# Patient Record
Sex: Female | Born: 1990 | Race: Black or African American | Hispanic: No | Marital: Single | Smoking: Former smoker
Health system: Southern US, Community
[De-identification: ages and names within clinical notes are randomized; demographics above are authoritative.]

## PROBLEM LIST (undated history)

## (undated) ENCOUNTER — Inpatient Hospital Stay (HOSPITAL_COMMUNITY): Payer: Self-pay

## (undated) DIAGNOSIS — Z789 Other specified health status: Secondary | ICD-10-CM

## (undated) DIAGNOSIS — R51 Headache: Secondary | ICD-10-CM

## (undated) DIAGNOSIS — C801 Malignant (primary) neoplasm, unspecified: Secondary | ICD-10-CM

## (undated) HISTORY — PX: MOUTH SURGERY: SHX715

## (undated) HISTORY — PX: APPENDECTOMY: SHX54

---
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
## 2009-08-26 ENCOUNTER — Other Ambulatory Visit: Admission: RE | Admit: 2009-08-26 | Discharge: 2009-08-26 | Payer: Self-pay | Admitting: Family Medicine

## 2009-10-24 ENCOUNTER — Emergency Department (HOSPITAL_COMMUNITY): Admission: EM | Admit: 2009-10-24 | Discharge: 2009-10-24 | Payer: Self-pay | Admitting: Emergency Medicine

## 2010-01-10 ENCOUNTER — Encounter: Admission: RE | Admit: 2010-01-10 | Discharge: 2010-01-10 | Payer: Self-pay | Admitting: Family Medicine

## 2010-08-27 LAB — RPR: RPR: NONREACTIVE

## 2010-08-27 LAB — ABO/RH

## 2010-08-27 LAB — HIV ANTIBODY (ROUTINE TESTING W REFLEX): HIV: NONREACTIVE

## 2010-08-27 LAB — GC/CHLAMYDIA PROBE AMP, GENITAL: Chlamydia: NEGATIVE

## 2010-10-14 NOTE — Consult Note (Signed)
[name], [name] NO.:  [id number]   MEDICAL RECORD NO.:  [id number]          PATIENT TYPE:  INP   LOCATION:  1540                         FACILITY:  [facility]   PHYSICIAN:  Ardeth Sportsman, MD     DATE OF BIRTH:  Nov 25, 1990   DATE OF CONSULTATION:  05/30/2007  DATE OF DISCHARGE:                                 CONSULTATION   REFERRING PHYSICIAN:  Carleene Cooper, M.D., [facility] Emergency  Department.   REASON FOR CONSULTATION:  Acute appendicitis.   HISTORY OF PRESENT ILLNESS:  [name] is a 20 year old  female, otherwise very healthy, who about 2 days ago started feeling  some abdominal pain.  It was rather vague, but is now more focused in  the right lower quadrant.  She has nauseated.  She has come close to  vomiting, but has not vomited.  No sick contacts or travel history.  She  is not had pain like this before.  Normally, she can eat anything she  wants without any difficulty, no history of inflammatory bowel disease  or irritable bowel syndrome.  She has no history of dysuria, polyuria or  recent UTI.  Her last menstrual period was about a week ago and she is  on Depo-Provera and that has been relatively stable.   The patient had concerns and her family brought her into the emergency  room for evaluation.   PAST MEDICAL HISTORY:  Negative.   PAST SURGICAL HISTORY:  Negative.   SOCIAL HISTORY:  No tobacco, alcohol or drug use.  She live with her  family who are present.   FAMILY HISTORY:  Negative for GI, hepatobiliary or colonic disorders.  No history of inflammatory bowel disease or irritable bowel syndrome.   REVIEW OF SYSTEMS:  As per HPI, otherwise constitutional,  ophthalmologic, ENT, cardiac, respiratory, GU, musculoskeletal,  neurologic, endocrine, hematologic, lymphatic and allergic are otherwise  negative.  GI:  As per HPI, no hematochezia or melena.   PHYSICAL EXAMINATION:  VITAL SIGNS:  Temperature is 97.8, respirations  18, blood pressure 196/48, pulse 80.  GENERAL:  She is a well-developed, well-nourished female lying very  still, uncomfortable, but no frankly toxic.  PSYCHIATRIC:  She is quiet, but interactive.  No evidence of dementia,  delirium, psychosis or paranoia.  NEUROLOGIC:  Cranial nerves 2-12 grossly intact.  Strength is 5/5 and  symmetrical.  No resting or tension tremors.  HEENT:  Pupils equal round and reactive to light, extraocular movements  intact.  Sclerae nonicteric, noninjected.  Normocephalic.  Mucous  membranes moist.  Nasopharynx and oropharynx clear.  HEART:  Regular rate and rhythm with no murmurs, rubs or gallops.  CHEST:  Clear to auscultation bilaterally with no wheezes, rales or  rhonchi.  NECK:  Supple without any masses.  Trachea in midline.  No carotid  bruits.  ABDOMEN:  Flat, but firm.  She has obvious tenderness in right lower  quadrant, greater in her suprapubic region with some guarding and early  peritoneal signs.  Left side of her abdomen and right upper quadrant  seem to not be very tender.  PELVIC:  Normal external female genitalia.  RECTAL:  Refused.  EXTREMITIES:  No clubbing, cyanosis or edema.  MUSCULOSKELETAL:  Full range of motion in shoulders, wrists, knee,  elbows and ankles.  LYMPHATICS:  No head, neck or groin lymphadenopathy.  SKIN:  No obvious petechiae, purpura or other sources of lesions.   LABORATORY DATA AND X-RAY FINDINGS:  Lipase 17.  Comprehensive metabolic  profile is negative.  Her white count is 6.5 with lymphocytes of 51% and  slight shift, hemoglobin 11.7 which is decreased.  Her wet-prep shows  many clue cells, but few white cells.  Urinalysis shows some rare  bacteria and trace leukocyte esterase.  Urine pregnancy is negative.   CT scan of the abdomen and pelvis which I reviewed with Dr. Kennith Center, with our radiology, which shows an inflamed appendix that  appears to be working in sigmoid fashion, initially cephalad going  down  into the pelvis, but no free fluid, no definite free air, although  suspicious for some thickening and possible perforation.  No evidence of  any bowel obstruction.   ASSESSMENT/RECOMMENDATIONS:  A 20 year old female with classic history  and physical and radiographic findings concerning for acute  appendicitis.  Anatomy and physiology of the digestive tract was clean.  Pathology of appendicitis, perforation, abscess and death were  discussed.  Options were discussed and recommendations made for a  diagnostic laparoscopy with appendectomy.   Risks of stroke, DVT, heart attack, pulmonary embolism and death were  discussed.  Risks such as bleeding, need for transfusion, wound  infection, abscess or prolonged pain were discussed.  Other diagnoses  and other risks were discussed.  Questions were discussed in front of  her family and she and her family agree to proceed.  I will work to do  this tonight.      Ardeth Sportsman, MD  Electronically Signed     SCG/MEDQ  D:  05/30/2007  T:  05/31/2007  Job:  [id number]

## 2010-10-14 NOTE — Op Note (Signed)
NAMECOLUMBIA, PANDEY NO.:  [id number]   MEDICAL RECORD NO.:  [id number]          PATIENT TYPE:  INP   LOCATION:  0098                         FACILITY:  [facility]   PHYSICIAN:  Ardeth Sportsman, MD     DATE OF BIRTH:  [birth year]   DATE OF PROCEDURE:  DATE OF DISCHARGE:                               OPERATIVE REPORT   PRIMARY CARE PHYSICIAN:  Not available.   ED M.D.:  Carleene Cooper, M.D.   SURGEON:  Ardeth Sportsman, M.D.   PREOPERATIVE DIAGNOSIS:  Acute appendicitis.   POSTOPERATIVE DIAGNOSES:  Acute appendicitis, nonperforated, not  supparative.   PROCEDURE PERFORMED:  Diagnostic laparoscopy with appendectomy.   ANESTHESIAS:  1. General anesthesia.  2. Local anesthetic in a field block around all port sites.   SPECIMENS:  Appendix.   DRAINS:  None.   ESTIMATED BLOOD LOSS:  Less than 5 mL.   COMPLICATIONS:  None apparent.   INDICATIONS:  Ms. Gigi Gin is a 20 year old female with an  episode of abdominal pain and worsening nausea that is focused down in  the right lower quadrant with physical exam and CT scan findings  strongly suggestive of acute appendicitis.  Anatomy &  physiology of the  digestive tract was explained and pathophysiology of acute appendicitis  was explained, with its risks of perforation, abscess formation and  death.  Options were discussed, recommendations made for diagnostic  laparoscopy with probable appendectomy.  Possible conversion to open.  Risks such as stroke, heart attack, deep venous thrombosis, pulmonary  embolism and death were discussed.  Risks such as bleeding, need for  transfusion, wound infection, abscess, injury to organs, prolonged pain,  incisional hernia and other diagnoses risks were discussed.  Questions  answered and she agreed to proceed.   OPERATIVE FINDINGS:  She had early acute appendicitis, with very long  appendix, but no significant evidence of separation or perforation or  abscess or any  other intra-abdominal pathology.  She had normal uterus  and ovaries, small bowel and colon.  Gallbladder and liver appeared to  be normal.  There were no intra-abdominal adhesions.  There is no  evidence of Fitz-Hugh-Curtis syndrome or pelvic inflammatory disease or  any other abnormalities to explain her pain.   DESCRIPTION OF PROCEDURE:  Informed consent was confirmed.  The patient  was already on IV antibiotics.  She voided just prior to going to the  operating room.  She had sequential compression devices active during  the entire case.  She underwent general anesthesia without any  difficulty.  She was just in supine, both arms tucked.  Her abdomen was  prepped and draped in sterile fashion.   Entry was gained in the abdomen, the patient was in deep reverse  Trendelenburg on the right.  We placed a 5 mm port in the right upper  quadrant using optical entry technique.  Camera inspection revealed no  intra-abdominal injury.  Capnoperitoneum to 15 mmHg provided good  abdominal insufflation.  Under direct visualization, 5 mm ports were  placed through the umbilicus and a 12 mm port was placed in the midline  suprapubic region.   Diagnostic laparoscopy was performed, which  noted findings above, which  were essentially normal, except for a thickened appendix going down  towards the pelvis.  The appendix and cecum, ascending colon were  mobilized in the lateral medial fashion by taking lateral sidewall  attachments to help bring the appendix more anteriorly.  Appendiceal  mesentery was ligated using ultrasonic dissection down to the normal  cecal base.  The appendix was removed using a laparoscopic stapler and  removed within the 12 mm port without any difficulty.   Over a liter of copious irrigation of saline provided good clear return.  Hemostasis was excellent.  The staple line was inspected and was intact  and healthy, with no evidence of any ischemia or perforation.  Inspection  was made down in the pelvis and other organs, with no  evidence of any intra-abdominal injury.  The capnoperitoneum was  aspirated, with the patient in steep reversed Trendelenburg.  The ports  were removed.  Suprapubic subfascial defect was closed using zero Vicryl  stitch.  Skin was closed using 4-0 Monocryl stitch.  The patient was  extubated and taken to the recovery room in stable condition.  I  explained the operative findings to the patient's family, especially her  mother.  Postoperative instructions were discussed.  They expressed  understanding and appreciation.      Ardeth Sportsman, MD  Electronically Signed     SCG/MEDQ  D:  05/31/2007  T:  05/31/2007  Job:  [id number]

## 2011-01-01 ENCOUNTER — Encounter (HOSPITAL_COMMUNITY): Payer: Self-pay | Admitting: *Deleted

## 2011-01-01 ENCOUNTER — Inpatient Hospital Stay (HOSPITAL_COMMUNITY)
Admission: AD | Admit: 2011-01-01 | Discharge: 2011-01-01 | Disposition: A | Discharge status: Home / Self Care | Payer: Self-pay | Source: Ambulatory Visit | Attending: Obstetrics and Gynecology | Admitting: Obstetrics and Gynecology

## 2011-01-01 DIAGNOSIS — N390 Urinary tract infection, site not specified: Secondary | ICD-10-CM

## 2011-01-01 DIAGNOSIS — O9989 Other specified diseases and conditions complicating pregnancy, childbirth and the puerperium: Secondary | ICD-10-CM | POA: Insufficient documentation

## 2011-01-01 DIAGNOSIS — R350 Frequency of micturition: Secondary | ICD-10-CM | POA: Insufficient documentation

## 2011-01-01 DIAGNOSIS — O2390 Unspecified genitourinary tract infection in pregnancy, unspecified trimester: Secondary | ICD-10-CM

## 2011-01-01 DIAGNOSIS — R109 Unspecified abdominal pain: Secondary | ICD-10-CM | POA: Insufficient documentation

## 2011-01-01 DIAGNOSIS — O2340 Unspecified infection of urinary tract in pregnancy, unspecified trimester: Secondary | ICD-10-CM

## 2011-01-01 HISTORY — DX: Other specified health status: Z78.9

## 2011-01-01 LAB — URINE MICROSCOPIC-ADD ON

## 2011-01-01 LAB — URINALYSIS, ROUTINE W REFLEX MICROSCOPIC
Ketones, ur: 15 mg/dL — AB
Specific Gravity, Urine: 1.015 (ref 1.005–1.030)

## 2011-01-01 MED ORDER — CEPHALEXIN 500 MG PO CAPS: 500.0000 mg | ORAL_CAPSULE | Freq: Four times a day (QID) | ORAL | Status: AC

## 2011-01-01 NOTE — ED Provider Notes (Signed)
History   Pt presents today c/o abd pain that comes and goes. She states the pain began about an hour and a half ago. She states she also noticed one "spot" of blood when she wiped several hours ago before she went to sleep. She denies vag dc, irritation, dysuria, or any other problems at this time. She states she has not seen any bleeding since that one time. She denies recent intercourse  Chief Complaint  Patient presents with  . Abdominal Pain   HPI  OB History    Grav Para Term Preterm Abortions TAB SAB Ect Mult Living   1               Past Medical History  Diagnosis Date  . No pertinent past medical history     Past Surgical History  Procedure Date  . Appendectomy     No family history on file.  History  Substance Use Topics  . Smoking status: Never Smoker   . Smokeless tobacco: Not on file  . Alcohol Use: No    Allergies:  Allergies  Allergen Reactions  . Morphine And Related Swelling    Prescriptions prior to admission  Medication Sig Dispense Refill  . prenatal vitamin w/FE, FA (PRENATAL 1 + 1) 27-1 MG TABS Take 1 tablet by mouth daily.          Review of Systems  Constitutional: Negative for fever.  Cardiovascular: Negative for chest pain.  Gastrointestinal: Positive for abdominal pain. Negative for nausea, vomiting, diarrhea and constipation.  Genitourinary: Positive for frequency. Negative for dysuria, urgency, hematuria and flank pain.  Neurological: Negative for dizziness and headaches.  Psychiatric/Behavioral: Negative for depression and suicidal ideas.   Physical Exam   Blood pressure 101/62, pulse 84, temperature 97.7 F (36.5 C), temperature source Oral, resp. rate 18, height 5\' 4"  (1.626 m), weight 133 lb (60.328 kg).  Physical Exam  Constitutional: She is oriented to person, place, and time. She appears well-developed and well-nourished. No distress.  HENT:  Head: Normocephalic and atraumatic.  Eyes: EOM are normal. Pupils are equal,  round, and reactive to light.  GI: Soft. She exhibits no distension. There is no tenderness. There is no rebound and no guarding.  Genitourinary: Vagina normal. No tenderness or bleeding around the vagina. No foreign body around the vagina. No vaginal discharge found.       Cervix Lg/closed. No bleeding noted.   Neurological: She is alert and oriented to person, place, and time.  Skin: Skin is warm and dry. She is not diaphoretic.  Psychiatric: She has a normal mood and affect. Her behavior is normal. Judgment and thought content normal.    MAU Course  Procedures  Discussed pt with Amy Scott at length. Will po hydrate. If ctx remain irregular and no cervical change, then will dc to home and tx for UTI.  Recheck of pt cervix at 4:54 shows no change. Pt no longer having pain. Ctx have now spaced out. NST is reactive.  Assessment and Plan  UTI: discussed with pt at length. Will give Rx for keflex. She has a f/u appt on Thursday of next week. Discussed diet, activity, risks, and precautions. Discussed signs and sx of preterm labor. She understood and agreed.  Amy Scott. Amy Scott III, DrHSc, MPAS, PA-C  01/01/2011, 4:09 AM   Amy Hoover, PA 01/01/11 [phone]

## 2011-01-01 NOTE — Progress Notes (Signed)
Pt reports she went to restroom and had a "spot of blood" about 3 hours ago, pain in lower abd started about 1 hour ago, pain comes and goes, denies dysuria.

## 2011-01-02 LAB — URINE CULTURE

## 2011-02-05 ENCOUNTER — Inpatient Hospital Stay (HOSPITAL_COMMUNITY)
Admission: AD | Admit: 2011-02-05 | Discharge: 2011-02-06 | Disposition: A | Discharge status: Home / Self Care | Payer: Medicaid Other | Source: Ambulatory Visit | Attending: Obstetrics and Gynecology | Admitting: Obstetrics and Gynecology

## 2011-02-05 ENCOUNTER — Encounter (HOSPITAL_COMMUNITY): Payer: Self-pay | Admitting: *Deleted

## 2011-02-05 DIAGNOSIS — M549 Dorsalgia, unspecified: Secondary | ICD-10-CM | POA: Insufficient documentation

## 2011-02-05 DIAGNOSIS — R109 Unspecified abdominal pain: Secondary | ICD-10-CM | POA: Insufficient documentation

## 2011-02-05 DIAGNOSIS — O99891 Other specified diseases and conditions complicating pregnancy: Secondary | ICD-10-CM | POA: Insufficient documentation

## 2011-02-05 NOTE — Progress Notes (Signed)
Pt presents to mau for contractions that started around 5pm.  Have come and gone irregularly.  Will be every and then slow down for a while and then start back.

## 2011-02-05 NOTE — Progress Notes (Signed)
Pt reports sharp pain in back and lower stomach off/on since 1700 today. Denies bleeding or ROM. G1, [facility] 03/04/2011

## 2011-02-05 NOTE — Progress Notes (Signed)
Dr. Richardson Dopp notified of pt presenting for contractions.  Notified of pt stating ctx that come and go irregularly but average every 5 mins.  Notified of VE.  Orders received to get UA, recheck cervix after one hour and if no change and UA normal pt may dc home.

## 2011-02-06 LAB — URINE MICROSCOPIC-ADD ON

## 2011-02-06 LAB — URINALYSIS, ROUTINE W REFLEX MICROSCOPIC
Hgb urine dipstick: NEGATIVE
Ketones, ur: NEGATIVE mg/dL
Nitrite: NEGATIVE
Urobilinogen, UA: 0.2 mg/dL (ref 0.0–1.0)
pH: 6 (ref 5.0–8.0)

## 2011-02-06 NOTE — Progress Notes (Signed)
Reviewed UA with NP.  Will dc patient.

## 2011-03-06 ENCOUNTER — Encounter (HOSPITAL_COMMUNITY): Payer: Self-pay | Admitting: *Deleted

## 2011-03-06 ENCOUNTER — Telehealth (HOSPITAL_COMMUNITY): Payer: Self-pay | Admitting: *Deleted

## 2011-03-06 LAB — COMPREHENSIVE METABOLIC PANEL
ALT: 12
AST: 15
CO2: 22
Calcium: 8.7
Chloride: 110
Creatinine, Ser: 0.6
Glucose, Bld: 120 — ABNORMAL HIGH
Total Bilirubin: 1

## 2011-03-06 LAB — URINE MICROSCOPIC-ADD ON

## 2011-03-06 LAB — WET PREP, GENITAL: Trich, Wet Prep: NONE SEEN

## 2011-03-06 LAB — CBC
Hemoglobin: 11.7 — ABNORMAL LOW
MCHC: 34.5
MCV: 84.6
RBC: 4
WBC: 6.5

## 2011-03-06 LAB — URINALYSIS, ROUTINE W REFLEX MICROSCOPIC
Bilirubin Urine: NEGATIVE
Glucose, UA: NEGATIVE
Nitrite: NEGATIVE
Protein, ur: NEGATIVE
Urobilinogen, UA: 1
pH: 6.5

## 2011-03-06 LAB — DIFFERENTIAL
Basophils Absolute: 0
Eosinophils Absolute: 0
Eosinophils Relative: 1
Lymphs Abs: 3.3
Neutrophils Relative %: 40 — ABNORMAL LOW

## 2011-03-06 LAB — LIPASE, BLOOD: Lipase: 17

## 2011-03-06 NOTE — Telephone Encounter (Signed)
Preadmission screen  

## 2011-03-09 ENCOUNTER — Inpatient Hospital Stay (HOSPITAL_COMMUNITY)
Admission: RE | Admit: 2011-03-09 | Discharge: 2011-03-12 | DRG: 775 | Disposition: A | Discharge status: Home / Self Care | Payer: Medicaid Other | Source: Ambulatory Visit | Attending: Obstetrics and Gynecology | Admitting: Obstetrics and Gynecology

## 2011-03-09 ENCOUNTER — Other Ambulatory Visit: Payer: Self-pay | Admitting: Obstetrics and Gynecology

## 2011-03-09 ENCOUNTER — Encounter (HOSPITAL_COMMUNITY): Payer: Self-pay

## 2011-03-09 LAB — CBC
HCT: 33.9 % — ABNORMAL LOW (ref 36.0–46.0)
Hemoglobin: 11.3 g/dL — ABNORMAL LOW (ref 12.0–15.0)
MCH: 28.7 pg (ref 26.0–34.0)
MCHC: 33.3 g/dL (ref 30.0–36.0)
MCV: 86 fL (ref 78.0–100.0)
RDW: 16.6 % — ABNORMAL HIGH (ref 11.5–15.5)

## 2011-03-09 MED ORDER — FLEET ENEMA 7-19 GM/118ML RE ENEM: 1.0000 | ENEMA | RECTAL | Status: DC | PRN

## 2011-03-09 MED ORDER — ONDANSETRON HCL 4 MG/2ML IJ SOLN: 4.0000 mg | Freq: Four times a day (QID) | INTRAMUSCULAR | Status: DC | PRN

## 2011-03-09 MED ORDER — OXYTOCIN 20 UNITS IN LACTATED RINGERS INFUSION - SIMPLE
125.0000 mL/h | Freq: Once | INTRAVENOUS | Status: AC
Start: 2011-03-09 — End: 2011-03-10
  Administered 2011-03-10: 125 mL/h via INTRAVENOUS

## 2011-03-09 MED ORDER — DINOPROSTONE 10 MG VA INST
10.0000 mg | VAGINAL_INSERT | Freq: Once | VAGINAL | Status: AC
  Administered 2011-03-09: 10 mg via VAGINAL
  Filled 2011-03-09: qty 1

## 2011-03-09 MED ORDER — CITRIC ACID-SODIUM CITRATE 334-500 MG/5ML PO SOLN: 30.0000 mL | ORAL | Status: DC | PRN

## 2011-03-09 MED ORDER — ZOLPIDEM TARTRATE 10 MG PO TABS
10.0000 mg | ORAL_TABLET | Freq: Every evening | ORAL | Status: DC | PRN
  Administered 2011-03-09: 10 mg via ORAL
  Filled 2011-03-09: qty 1

## 2011-03-09 MED ORDER — ACETAMINOPHEN 325 MG PO TABS: 650.0000 mg | ORAL_TABLET | ORAL | Status: DC | PRN

## 2011-03-09 MED ORDER — IBUPROFEN 600 MG PO TABS
600.0000 mg | ORAL_TABLET | Freq: Four times a day (QID) | ORAL | Status: DC | PRN
  Administered 2011-03-10: 600 mg via ORAL
  Filled 2011-03-09: qty 1

## 2011-03-09 MED ORDER — TERBUTALINE SULFATE 1 MG/ML IJ SOLN: 0.2500 mg | Freq: Once | INTRAMUSCULAR | Status: AC | PRN

## 2011-03-09 MED ORDER — LIDOCAINE HCL (PF) 1 % IJ SOLN
30.0000 mL | INTRAMUSCULAR | Status: DC | PRN
  Filled 2011-03-09 (×2): qty 30

## 2011-03-09 MED ORDER — LACTATED RINGERS IV SOLN: 500.0000 mL | INTRAVENOUS | Status: DC | PRN

## 2011-03-09 MED ORDER — LACTATED RINGERS IV SOLN: INTRAVENOUS | Status: DC

## 2011-03-09 MED ORDER — OXYTOCIN BOLUS FROM INFUSION
500.0000 mL | Freq: Once | INTRAVENOUS | Status: DC
Start: 2011-03-09 — End: 2011-03-10
  Filled 2011-03-09: qty 1000
  Filled 2011-03-09: qty 500

## 2011-03-09 NOTE — Progress Notes (Signed)
Dr. Richardson Dopp called to check on pt. Informed Dr. Richardson Dopp patient Cervadil about to be placed. MD aware of reactive FHR.

## 2011-03-10 ENCOUNTER — Encounter (HOSPITAL_COMMUNITY): Payer: Self-pay | Admitting: Anesthesiology

## 2011-03-10 ENCOUNTER — Inpatient Hospital Stay (HOSPITAL_COMMUNITY): Payer: Medicaid Other | Admitting: Anesthesiology

## 2011-03-10 LAB — COMPREHENSIVE METABOLIC PANEL
ALT: 6 U/L (ref 0–35)
AST: 17 U/L (ref 0–37)
Alkaline Phosphatase: 176 U/L — ABNORMAL HIGH (ref 39–117)
Calcium: 9.1 mg/dL (ref 8.4–10.5)
Glucose, Bld: 99 mg/dL (ref 70–99)
Potassium: 3.3 mEq/L — ABNORMAL LOW (ref 3.5–5.1)
Sodium: 137 mEq/L (ref 135–145)
Total Protein: 5.8 g/dL — ABNORMAL LOW (ref 6.0–8.3)

## 2011-03-10 MED ORDER — LANOLIN HYDROUS EX OINT: TOPICAL_OINTMENT | CUTANEOUS | Status: DC | PRN

## 2011-03-10 MED ORDER — MEASLES, MUMPS & RUBELLA VAC ~~LOC~~ INJ
0.5000 mL | INJECTION | Freq: Once | SUBCUTANEOUS | Status: DC
  Filled 2011-03-10: qty 0.5

## 2011-03-10 MED ORDER — PHENYLEPHRINE 40 MCG/ML (10ML) SYRINGE FOR IV PUSH (FOR BLOOD PRESSURE SUPPORT)
80.0000 ug | PREFILLED_SYRINGE | INTRAVENOUS | Status: DC | PRN
  Filled 2011-03-10 (×2): qty 5

## 2011-03-10 MED ORDER — PRENATAL PLUS 27-1 MG PO TABS
1.0000 | ORAL_TABLET | Freq: Every day | ORAL | Status: DC
  Administered 2011-03-10 – 2011-03-12 (×3): 1 via ORAL
  Filled 2011-03-10 (×3): qty 1

## 2011-03-10 MED ORDER — BENZOCAINE-MENTHOL 20-0.5 % EX AERO: 1.0000 "application " | INHALATION_SPRAY | CUTANEOUS | Status: DC | PRN

## 2011-03-10 MED ORDER — DIBUCAINE 1 % RE OINT: 1.0000 "application " | TOPICAL_OINTMENT | RECTAL | Status: DC | PRN

## 2011-03-10 MED ORDER — LACTATED RINGERS IV SOLN: 500.0000 mL | Freq: Once | INTRAVENOUS | Status: DC

## 2011-03-10 MED ORDER — FENTANYL 2.5 MCG/ML BUPIVACAINE 1/10 % EPIDURAL INFUSION (WH - ANES)
14.0000 mL/h | INTRAMUSCULAR | Status: DC
  Filled 2011-03-10: qty 60

## 2011-03-10 MED ORDER — ONDANSETRON HCL 4 MG PO TABS: 4.0000 mg | ORAL_TABLET | ORAL | Status: DC | PRN

## 2011-03-10 MED ORDER — EPHEDRINE 5 MG/ML INJ
10.0000 mg | INTRAVENOUS | Status: DC | PRN
  Filled 2011-03-10: qty 4

## 2011-03-10 MED ORDER — POTASSIUM CHLORIDE CRYS ER 20 MEQ PO TBCR
20.0000 meq | EXTENDED_RELEASE_TABLET | Freq: Once | ORAL | Status: AC
  Administered 2011-03-10: 20 meq via ORAL
  Filled 2011-03-10: qty 1

## 2011-03-10 MED ORDER — FENTANYL 2.5 MCG/ML BUPIVACAINE 1/10 % EPIDURAL INFUSION (WH - ANES)
INTRAMUSCULAR | Status: DC | PRN
  Administered 2011-03-10: 14 mL/h via EPIDURAL

## 2011-03-10 MED ORDER — PHENYLEPHRINE 40 MCG/ML (10ML) SYRINGE FOR IV PUSH (FOR BLOOD PRESSURE SUPPORT)
80.0000 ug | PREFILLED_SYRINGE | INTRAVENOUS | Status: DC | PRN
  Filled 2011-03-10: qty 5

## 2011-03-10 MED ORDER — WITCH HAZEL-GLYCERIN EX PADS: 1.0000 "application " | MEDICATED_PAD | CUTANEOUS | Status: DC | PRN

## 2011-03-10 MED ORDER — ONDANSETRON HCL 4 MG/2ML IJ SOLN: 4.0000 mg | INTRAMUSCULAR | Status: DC | PRN

## 2011-03-10 MED ORDER — DIPHENHYDRAMINE HCL 25 MG PO CAPS: 25.0000 mg | ORAL_CAPSULE | Freq: Four times a day (QID) | ORAL | Status: DC | PRN

## 2011-03-10 MED ORDER — TETANUS-DIPHTH-ACELL PERTUSSIS 5-2.5-18.5 LF-MCG/0.5 IM SUSP
0.5000 mL | Freq: Once | INTRAMUSCULAR | Status: AC
  Administered 2011-03-11: 0.5 mL via INTRAMUSCULAR
  Filled 2011-03-10: qty 0.5

## 2011-03-10 MED ORDER — IBUPROFEN 600 MG PO TABS
600.0000 mg | ORAL_TABLET | Freq: Four times a day (QID) | ORAL | Status: DC
  Administered 2011-03-10 – 2011-03-12 (×8): 600 mg via ORAL
  Filled 2011-03-10 (×8): qty 1

## 2011-03-10 MED ORDER — BENZOCAINE-MENTHOL 20-0.5 % EX AERO
INHALATION_SPRAY | CUTANEOUS | Status: AC
  Filled 2011-03-10: qty 56

## 2011-03-10 MED ORDER — EPHEDRINE 5 MG/ML INJ
10.0000 mg | INTRAVENOUS | Status: DC | PRN
  Filled 2011-03-10 (×2): qty 4

## 2011-03-10 MED ORDER — SIMETHICONE 80 MG PO CHEW: 80.0000 mg | CHEWABLE_TABLET | ORAL | Status: DC | PRN

## 2011-03-10 MED ORDER — ZOLPIDEM TARTRATE 5 MG PO TABS: 5.0000 mg | ORAL_TABLET | Freq: Every evening | ORAL | Status: DC | PRN

## 2011-03-10 MED ORDER — LIDOCAINE HCL 1.5 % IJ SOLN
INTRAMUSCULAR | Status: DC | PRN
  Administered 2011-03-10 (×2): 5 mL via EPIDURAL

## 2011-03-10 MED ORDER — MEDROXYPROGESTERONE ACETATE 150 MG/ML IM SUSP
150.0000 mg | INTRAMUSCULAR | Status: AC | PRN
  Administered 2011-03-12: 150 mg via INTRAMUSCULAR
  Filled 2011-03-10: qty 1

## 2011-03-10 MED ORDER — DIPHENHYDRAMINE HCL 50 MG/ML IJ SOLN: 12.5000 mg | INTRAMUSCULAR | Status: DC | PRN

## 2011-03-10 MED ORDER — FERROUS SULFATE 325 (65 FE) MG PO TABS
325.0000 mg | ORAL_TABLET | Freq: Three times a day (TID) | ORAL | Status: DC
  Administered 2011-03-10 – 2011-03-12 (×6): 325 mg via ORAL
  Filled 2011-03-10 (×8): qty 1

## 2011-03-10 MED ORDER — SENNOSIDES-DOCUSATE SODIUM 8.6-50 MG PO TABS: 2.0000 | ORAL_TABLET | Freq: Every day | ORAL | Status: DC

## 2011-03-10 NOTE — Anesthesia Preprocedure Evaluation (Signed)
Anesthesia Evaluation  Name, MR# and DOB Patient awake  General Assessment Comment  Reviewed: Allergy & Precautions, H&P , Patient's Chart, lab work & pertinent test results  Airway Mallampati: I TM Distance: >3 FB Neck ROM: full    Dental No notable dental hx.    Pulmonary    Pulmonary exam normal       Cardiovascular     Neuro/Psych Negative Neurological ROS  Negative Psych ROS   GI/Hepatic negative GI ROS Neg liver ROS    Endo/Other  Negative Endocrine ROS  Renal/GU negative Renal ROS  Genitourinary negative   Musculoskeletal negative musculoskeletal ROS (+)   Abdominal Normal abdominal exam  (+)   Peds  Hematology negative hematology ROS (+)   Anesthesia Other Findings   Reproductive/Obstetrics (+) Pregnancy                           Anesthesia Physical Anesthesia Plan  ASA: II  Anesthesia Plan: Epidural   Post-op Pain Management:    Induction:   Airway Management Planned:   Additional Equipment:   Intra-op Plan:   Post-operative Plan:   Informed Consent: I have reviewed the patients History and Physical, chart, labs and discussed the procedure including the risks, benefits and alternatives for the proposed anesthesia with the patient or authorized representative who has indicated his/her understanding and acceptance.     Plan Discussed with:   Anesthesia Plan Comments:         Anesthesia Quick Evaluation  

## 2011-03-10 NOTE — Progress Notes (Signed)
Delivery Note At 4:32 AM a viable and healthy female was delivered via Vaginal, Spontaneous Delivery (Presentation:vtx  ).  APGAR: 9, 9; weight 6 lb,15oz.   Placenta status: Intact, Spontaneous.  Cord: 3 vessels with the following complications: None.  Cord pH: na  Anesthesia: Epidural  Episiotomy: None Lacerations: 2nd degree Suture Repair: 2.0 chromic Est. Blood Loss (mL): 500cc  Mom to postpartum.  Baby to nursery-stable.  Kurt Azimi E 03/10/2011, 4:59 AM

## 2011-03-10 NOTE — H&P (Signed)
Genisis Sonnier is a 20 y.o. female presenting for  incuction at 40 wks and 5 days   OB History    Grav Para Term Preterm Abortions TAB SAB Ect Mult Living   1              Past Medical History  Diagnosis Date  . No pertinent past medical history    Past Surgical History  Procedure Date  . Appendectomy   . Mouth surgery    Family History: family history is not on file. Social History:  reports that she has never smoked. She does not have any smokeless tobacco history on file. She reports that she does not drink alcohol or use illicit drugs. ROS Negative   Dilation: 10 Effacement (%): 100 Station: +1 Exam by:: Chief Technology Officer Blood pressure 125/71, pulse 88, temperature 98.4 F (36.9 C), temperature source Oral, resp. rate 20, height 5\' 4"  (1.626 m), weight 66.679 kg (147 lb). CV RRR Lung Clear  abd soft nontender Ext trace edema    Prenatal labs: ABO, Rh: B/Positive/-- (03/28 0000) Antibody: Negative (03/28 0000) Rubella:   RPR: NON REACTIVE (10/08 2130)  HBsAg: Negative (03/28 0000)  HIV: Non-reactive, Non-reactive (03/28 0000)  GBS: Negative (09/13 0000)   Assessment/Plan: Pt admitted for induction over night received cervidil #1 and delivered  This am  Routine postpartum care  Johanny Segers J. 03/10/2011, 9:00 AM

## 2011-03-10 NOTE — Anesthesia Procedure Notes (Signed)
Epidural Patient location during procedure: OB Start time: 03/10/2011 3:29 AM End time: 03/10/2011 3:34 AM Reason for block: procedure for pain  Staffing Anesthesiologist: Sandrea Hughs Performed by: anesthesiologist   Preanesthetic Checklist Completed: patient identified, site marked, surgical consent, pre-op evaluation, timeout performed, IV checked, risks and benefits discussed and monitors and equipment checked  Epidural Patient position: sitting Prep: site prepped and draped and DuraPrep Patient monitoring: continuous pulse ox and blood pressure Approach: midline Injection technique: LOR air  Needle:  Needle type: Tuohy  Needle gauge: 17 G Needle length: 9 cm Needle insertion depth: 5 cm cm Catheter type: closed end flexible Catheter size: 19 Gauge Catheter at skin depth: 10 cm Test dose: negative and 1.5% lidocaine  Assessment Sensory level: T6 Events: blood not aspirated, injection not painful, no injection resistance, negative IV test and no paresthesia

## 2011-03-10 NOTE — Anesthesia Postprocedure Evaluation (Signed)
  Anesthesia Post-op Note  Patient: Amy Scott  Procedure(s) Performed: * No procedures listed *  Patient Location: Mother/Baby  Anesthesia Type: Epidural  Level of Consciousness: awake, alert , oriented and patient cooperative  Airway and Oxygen Therapy: Patient Spontanous Breathing  Post-op Pain: none  Post-op Assessment: Post-op Vital signs reviewed and Patient's Cardiovascular Status Stable  Post-op Vital Signs: Reviewed and stable  Complications: No apparent anesthesia complications

## 2011-03-10 NOTE — Progress Notes (Signed)
Pt was ordered CMP and a Uric acid labs this AM. Notified MD (Dr. Richardson Dopp) of the results. K-Dur ordered for the potassium level of 3.3. Lenon Oms RN.

## 2011-03-10 NOTE — Progress Notes (Signed)
UR Chart review completed.  

## 2011-03-11 LAB — CBC
HCT: 30.2 % — ABNORMAL LOW (ref 36.0–46.0)
Hemoglobin: 10.1 g/dL — ABNORMAL LOW (ref 12.0–15.0)
MCH: 28.8 pg (ref 26.0–34.0)
MCHC: 33.4 g/dL (ref 30.0–36.0)
MCV: 86 fL (ref 78.0–100.0)
RBC: 3.51 MIL/uL — ABNORMAL LOW (ref 3.87–5.11)

## 2011-03-11 MED ORDER — OXYCODONE-ACETAMINOPHEN 5-325 MG PO TABS: 1.0000 | ORAL_TABLET | ORAL | Status: DC | PRN

## 2011-03-11 NOTE — Progress Notes (Signed)
Post Partum Day 1 s/p SVD Subjective: no complaints, up ad lib, voiding, tolerating PO and + flatus  Objective: Blood pressure 95/62, pulse 71, temperature 98.8 F (37.1 C), temperature source Oral, resp. rate 18, height 5\' 4"  (1.626 m), weight 66.679 kg (147 lb).  Physical Exam:  General: alert and cooperative Lochia: appropriate Uterine Fundus: firm Incision: NA DVT Evaluation: No evidence of DVT seen on physical exam.   Basename 03/11/11 0520 03/09/11 2130  HGB 10.1* 11.3*  HCT 30.2* 33.9*    Assessment/Plan: Plan for discharge tomorrow and Breastfeeding   LOS: 2 days   Amy Scott J. 03/11/2011, 3:33 PM

## 2011-03-11 NOTE — Anesthesia Postprocedure Evaluation (Signed)
Anesthesia Post Note  Patient: Amy Scott  Procedure(s) Performed: * No procedures listed *  Anesthesia type: Epidural  Patient location: Mother/Baby  Post pain: Pain level controlled  Post assessment: Post-op Vital signs reviewed  Last Vitals:  Filed Vitals:   03/10/11 2135  BP: 97/59  Pulse: 82  Temp: 98.7 F (37.1 C)  Resp: 18    Post vital signs: Reviewed  Level of consciousness: awake  Complications: No apparent anesthesia complications

## 2011-03-12 ENCOUNTER — Encounter (HOSPITAL_COMMUNITY): Payer: Self-pay

## 2011-03-12 MED ORDER — PRENATAL PLUS 27-1 MG PO TABS: 1.0000 | ORAL_TABLET | Freq: Every day | ORAL | Status: DC

## 2011-03-12 MED ORDER — IRON 325 (65 FE) MG PO TABS: ORAL_TABLET | ORAL | Status: DC

## 2011-03-12 MED ORDER — MEDROXYPROGESTERONE ACETATE 150 MG/ML IM SUSP: 150.0000 mg | Freq: Once | INTRAMUSCULAR | Status: DC

## 2011-03-12 MED ORDER — IBUPROFEN 600 MG PO TABS: 600.0000 mg | ORAL_TABLET | Freq: Four times a day (QID) | ORAL | Status: AC | PRN

## 2011-03-12 NOTE — Discharge Summary (Signed)
Obstetric Discharge Summary Reason for Admission: induction of labor Prenatal Procedures: none Intrapartum Procedures: spontaneous vaginal delivery Postpartum Procedures: none Complications-Operative and Postpartum: none Hemoglobin  Date Value Range Status  03/11/2011 10.1* 12.0-15.0 (g/dL) Final     HCT  Date Value Range Status  03/11/2011 30.2* 36.0-46.0 (%) Final    Discharge Diagnoses: Term Pregnancy-delivered  Discharge Information: Date: 03/12/2011 Activity: pelvic rest Diet: routine Medications: PNV, Ibuprofen and Iron Condition: stable Instructions: refer to practice specific booklet Discharge to: home Follow-up Information    Follow up with Taneesha Edgin J.. Make an appointment in 6 weeks.   Contact information:   [address] [place] [address] [phone]          Newborn Data: Live born female  Birth Weight: 6 lb 15.3 oz (3155 g) APGAR: 9, 9  Home with mother.  Emma Schupp J. 03/12/2011, 9:36 AM

## 2011-03-12 NOTE — Progress Notes (Signed)
Post Partum Day 2 Subjective: no complaints, up ad lib, voiding, tolerating PO and + flatus  Objective: Blood pressure 106/72, pulse 65, temperature 98.1 F (36.7 C), temperature source Oral, resp. rate 18, height 5\' 4"  (1.626 m), weight 66.679 kg (147 lb).  Physical Exam:  General: alert and cooperative Lochia: appropriate Uterine Fundus: firm Incision: NA DVT Evaluation: No evidence of DVT seen on physical exam.   Basename 03/11/11 0520 03/09/11 2130  HGB 10.1* 11.3*  HCT 30.2* 33.9*    Assessment/Plan: Discharge home and Breastfeeding   LOS: 3 days   Tanish Prien J. 03/12/2011, 9:32 AM

## 2012-01-03 ENCOUNTER — Encounter (HOSPITAL_COMMUNITY): Payer: Self-pay | Admitting: *Deleted

## 2012-01-03 ENCOUNTER — Emergency Department (HOSPITAL_COMMUNITY)
Admission: EM | Admit: 2012-01-03 | Discharge: 2012-01-03 | Disposition: A | Discharge status: Home / Self Care | Payer: Medicaid Other | Attending: Emergency Medicine | Admitting: Emergency Medicine

## 2012-01-03 DIAGNOSIS — H00019 Hordeolum externum unspecified eye, unspecified eyelid: Secondary | ICD-10-CM

## 2012-01-03 DIAGNOSIS — F17200 Nicotine dependence, unspecified, uncomplicated: Secondary | ICD-10-CM | POA: Insufficient documentation

## 2012-01-03 MED ORDER — TETRACAINE HCL 0.5 % OP SOLN
2.0000 [drp] | Freq: Once | OPHTHALMIC | Status: AC
  Administered 2012-01-03: 2 [drp] via OPHTHALMIC
  Filled 2012-01-03: qty 2

## 2012-01-03 NOTE — ED Notes (Signed)
Patient is alert and oriented x3.  She is complaining of left eye pain that started on Friday. She currently rates her pain at 9 of 10.  She present with swelling to the left eye and states That it aches with some stinging and blurred vision.

## 2012-01-03 NOTE — ED Provider Notes (Signed)
History     CSN: [id number]  Arrival date & time 01/03/12  1433   First MD Initiated Contact with Patient 01/03/12 1536      Chief Complaint  Patient presents with  . Eye Pain    left eye    (Consider location/radiation/quality/duration/timing/severity/associated sxs/prior treatment) HPI Comments: Patient reports that she has noticed swelling of her left upper eyelid since she woke up on Friday morning, two days ago.  She reports that the area is tender and stings.  No injury to the eye.  No foreign body.  No photosensitivity.  No increased pain with EOM.  No drainage of the eye.  No redness of the eye.  No fever or chills.  She thinks that her vision may be more blurry today.    Patient is a 21 y.o. female presenting with eye pain. The history is provided by the patient.  Eye Pain Pertinent negatives include no chills, fever, nausea or vomiting.    Past Medical History  Diagnosis Date  . No pertinent past medical history     Past Surgical History  Procedure Date  . Appendectomy   . Mouth surgery     History reviewed. No pertinent family history.  History  Substance Use Topics  . Smoking status: Current Everyday Smoker  . Smokeless tobacco: Not on file  . Alcohol Use: No    OB History    Grav Para Term Preterm Abortions TAB SAB Ect Mult Living   1 1 1       1       Review of Systems  Constitutional: Negative for fever and chills.  Eyes: Positive for pain. Negative for photophobia, discharge and redness.  Gastrointestinal: Negative for nausea and vomiting.  Skin: Negative for color change.    Allergies  Morphine and related  Home Medications   Current Outpatient Rx  Name Route Sig Dispense Refill  . PRENATAL PLUS 27-1 MG PO TABS Oral Take 1 tablet by mouth daily.      BP 122/62  Pulse 76  Temp 98.5 F (36.9 C) (Oral)  Resp 18  SpO2 100%  LMP 12/13/2011  Breastfeeding? No  Physical Exam  Nursing note and vitals reviewed. Constitutional: She  appears well-developed and well-nourished. No distress.  HENT:  Head: Normocephalic and atraumatic.  Eyes: Conjunctivae and EOM are normal. Pupils are equal, round, and reactive to light. No foreign bodies found. Right eye exhibits no discharge and no exudate. No foreign body present in the right eye. Left eye exhibits hordeolum. Left eye exhibits no discharge and no exudate. No foreign body present in the left eye. Right conjunctiva is not injected. Right conjunctiva has no hemorrhage. Left conjunctiva is not injected. Left conjunctiva has no hemorrhage.  Slit lamp exam:      The right eye shows no fluorescein uptake.       The left eye shows no fluorescein uptake.         No increased pain with EOM  Neck: Normal range of motion. Neck supple.  Cardiovascular: Normal rate, regular rhythm and normal heart sounds.   Pulmonary/Chest: Effort normal and breath sounds normal.  Neurological: She is alert.  Skin: Skin is warm and dry. She is not diaphoretic. No erythema.  Psychiatric: She has a normal mood and affect.    ED Course  Procedures (including critical care time)  Labs Reviewed - No data to display No results found.   No diagnosis found.    MDM  Patient with Hordeolum  of her left upper eyelid.  Eyelid inverted, no foreign bodies.  Normal visual acuity.  Flouresein stain shows no corneal abrasion.          Pascal Lux Dazey, PA-C 01/04/12 [phone]

## 2012-01-04 NOTE — ED Provider Notes (Signed)
Medical screening examination/treatment/procedure(s) were performed by non-physician practitioner and as supervising physician I was immediately available for consultation/collaboration.  Margeret Stachnik, MD 01/04/12 1455 

## 2012-10-06 ENCOUNTER — Inpatient Hospital Stay (HOSPITAL_COMMUNITY)
Admission: AD | Admit: 2012-10-06 | Discharge: 2012-10-06 | Disposition: A | Discharge status: Home / Self Care | Payer: Medicaid Other | Source: Ambulatory Visit | Attending: Obstetrics & Gynecology | Admitting: Obstetrics & Gynecology

## 2012-10-06 ENCOUNTER — Encounter (HOSPITAL_COMMUNITY): Payer: Self-pay | Admitting: *Deleted

## 2012-10-06 DIAGNOSIS — R109 Unspecified abdominal pain: Secondary | ICD-10-CM | POA: Insufficient documentation

## 2012-10-06 DIAGNOSIS — N949 Unspecified condition associated with female genital organs and menstrual cycle: Secondary | ICD-10-CM

## 2012-10-06 DIAGNOSIS — O2390 Unspecified genitourinary tract infection in pregnancy, unspecified trimester: Secondary | ICD-10-CM | POA: Insufficient documentation

## 2012-10-06 DIAGNOSIS — B373 Candidiasis of vulva and vagina: Secondary | ICD-10-CM

## 2012-10-06 DIAGNOSIS — B3731 Acute candidiasis of vulva and vagina: Secondary | ICD-10-CM | POA: Insufficient documentation

## 2012-10-06 DIAGNOSIS — O26859 Spotting complicating pregnancy, unspecified trimester: Secondary | ICD-10-CM | POA: Insufficient documentation

## 2012-10-06 DIAGNOSIS — O210 Mild hyperemesis gravidarum: Secondary | ICD-10-CM | POA: Insufficient documentation

## 2012-10-06 HISTORY — DX: Headache: R51

## 2012-10-06 LAB — URINALYSIS, ROUTINE W REFLEX MICROSCOPIC
Bilirubin Urine: NEGATIVE
Ketones, ur: 40 mg/dL — AB
Specific Gravity, Urine: 1.02 (ref 1.005–1.030)
pH: 7.5 (ref 5.0–8.0)

## 2012-10-06 LAB — URINE MICROSCOPIC-ADD ON

## 2012-10-06 LAB — WET PREP, GENITAL: Trich, Wet Prep: NONE SEEN

## 2012-10-06 MED ORDER — FLUCONAZOLE 150 MG PO TABS
150.0000 mg | ORAL_TABLET | Freq: Once | ORAL | Status: AC
  Administered 2012-10-06: 150 mg via ORAL
  Filled 2012-10-06: qty 1

## 2012-10-06 MED ORDER — CYCLOBENZAPRINE HCL 10 MG PO TABS: 10.0000 mg | ORAL_TABLET | Freq: Two times a day (BID) | ORAL | Status: DC | PRN

## 2012-10-06 NOTE — MAU Provider Note (Signed)
History     CSN: [id number]  Arrival date and time: 10/06/12 1515   First Provider Initiated Contact with Patient 10/06/12 1631      Chief Complaint  Patient presents with  . Threatened Miscarriage   HPI [name] is a 22 y.o. G2P1001 at [redacted]w[redacted]d who presents to MAU today with complaint of spotting and lower abdominal pain. The patient states that she has noticed some spotting occasionally with wiping the last 2 days. She has lower abdominal pain that comes and goes. It is worse with ambulation and change or positions. She rates it at 5/10 now. She denies vaginal discharge, fever or UTI symptoms. She has had nausea and vomiting occasionally x 1 week, but denies nausea now.   OB History   Grav Para Term Preterm Abortions TAB SAB Ect Mult Living   2 1 1       1       Past Medical History  Diagnosis Date  . No pertinent past medical history   . Headache     Past Surgical History  Procedure Laterality Date  . Appendectomy    . Mouth surgery      History reviewed. No pertinent family history.  History  Substance Use Topics  . Smoking status: Current Every Day Smoker    Types: Cigarettes  . Smokeless tobacco: Never Used  . Alcohol Use: No    Allergies:  Allergies  Allergen Reactions  . Morphine And Related Itching and Swelling    Prescriptions prior to admission  Medication Sig Dispense Refill  . naproxen sodium (ANAPROX) 220 MG tablet Take 220 mg by mouth 2 (two) times daily as needed (for headache).        Review of Systems  Constitutional: Negative for fever, chills and malaise/fatigue.  Gastrointestinal: Positive for nausea, vomiting and abdominal pain. Negative for diarrhea and constipation.  Genitourinary: Negative for dysuria, urgency and frequency.       Neg - vaginal discharge + vaginal bleeding  Neurological: Negative for dizziness and loss of consciousness.   Physical Exam   Blood pressure 107/58, pulse 91, temperature 98.5 F (36.9 C),  temperature source Oral, resp. rate 18, height 5' 2.5" (1.588 m), weight 113 lb (51.256 kg), last menstrual period 07/04/2012, not currently breastfeeding.  Physical Exam  Constitutional: She is oriented to person, place, and time. She appears well-developed and well-nourished. No distress.  HENT:  Head: Normocephalic and atraumatic.  Cardiovascular: Normal rate, regular rhythm and normal heart sounds.   Respiratory: Effort normal and breath sounds normal. No respiratory distress.  GI: Soft. Bowel sounds are normal. She exhibits no distension and no mass. There is tenderness (mild tenderness to palpation of the epigastric region and lower abdomen). There is no rebound and no guarding.  Neurological: She is alert and oriented to person, place, and time.  Skin: Skin is warm and dry. No erythema.  Psychiatric: She has a normal mood and affect.   Results for orders placed during the hospital encounter of 10/06/12 (from the past 24 hour(s))  URINALYSIS, ROUTINE W REFLEX MICROSCOPIC     Status: Abnormal   Collection Time    10/06/12  4:03 PM      Result Value Range   Color, Urine YELLOW  YELLOW   APPearance HAZY (*) CLEAR   Specific Gravity, Urine 1.020  1.005 - 1.030   pH 7.5  5.0 - 8.0   Glucose, UA NEGATIVE  NEGATIVE mg/dL   Hgb urine dipstick TRACE (*) NEGATIVE  Bilirubin Urine NEGATIVE  NEGATIVE   Ketones, ur 40 (*) NEGATIVE mg/dL   Protein, ur NEGATIVE  NEGATIVE mg/dL   Urobilinogen, UA 1.0  0.0 - 1.0 mg/dL   Nitrite NEGATIVE  NEGATIVE   Leukocytes, UA SMALL (*) NEGATIVE  URINE MICROSCOPIC-ADD ON     Status: Abnormal   Collection Time    10/06/12  4:03 PM      Result Value Range   Squamous Epithelial / LPF FEW (*) RARE   WBC, UA 3-6  <3 WBC/hpf   RBC / HPF 3-6  <3 RBC/hpf   Urine-Other MUCOUS PRESENT    POCT PREGNANCY, URINE     Status: Abnormal   Collection Time    10/06/12  4:10 PM      Result Value Range   Preg Test, Ur POSITIVE (*) NEGATIVE    MAU Course   Procedures None  MDM UA, UPT, Wet prep and GC/Chlamdyia  Assessment and Plan  A: Yeast vaginitis Round ligament pain  P: Discharge home Treated with Diflucan in MAU today Patient advised to take Tylenol and warm baths for pain Patient given pregnancy verification and list of OB providers Patient may return to MAU as needed  Freddi Starr, PA-C  10/06/2012, 4:31 PM

## 2012-10-06 NOTE — MAU Note (Signed)
Had positive home preg test on 05/03. Has been spotting for the last 2 days.  Wanted to know if she can get an [place].  Been having a lot of pain in lower stomach for past 2 wks.

## 2012-10-07 LAB — GC/CHLAMYDIA PROBE AMP
CT Probe RNA: NEGATIVE
GC Probe RNA: NEGATIVE

## 2012-10-10 NOTE — MAU Provider Note (Signed)
Attestation of Attending Supervision of Advanced Practitioner (CNM/NP): Evaluation and management procedures were performed by the Advanced Practitioner under my supervision and collaboration.  I have reviewed the Advanced Practitioner's note and chart, and I agree with the management and plan.  HARRAWAY-SMITH, Cohan Stipes 10:40 AM

## 2013-04-07 ENCOUNTER — Emergency Department (HOSPITAL_COMMUNITY)
Admission: EM | Admit: 2013-04-07 | Discharge: 2013-04-07 | Disposition: A | Discharge status: Home / Self Care | Payer: Medicaid Other | Attending: Emergency Medicine | Admitting: Emergency Medicine

## 2013-04-07 ENCOUNTER — Emergency Department (HOSPITAL_COMMUNITY): Payer: Medicaid Other

## 2013-04-07 ENCOUNTER — Encounter (HOSPITAL_COMMUNITY): Payer: Self-pay | Admitting: Emergency Medicine

## 2013-04-07 DIAGNOSIS — R109 Unspecified abdominal pain: Secondary | ICD-10-CM | POA: Insufficient documentation

## 2013-04-07 DIAGNOSIS — F17200 Nicotine dependence, unspecified, uncomplicated: Secondary | ICD-10-CM | POA: Insufficient documentation

## 2013-04-07 DIAGNOSIS — Z9089 Acquired absence of other organs: Secondary | ICD-10-CM | POA: Insufficient documentation

## 2013-04-07 DIAGNOSIS — R1110 Vomiting, unspecified: Secondary | ICD-10-CM | POA: Insufficient documentation

## 2013-04-07 DIAGNOSIS — R197 Diarrhea, unspecified: Secondary | ICD-10-CM | POA: Insufficient documentation

## 2013-04-07 DIAGNOSIS — Z3202 Encounter for pregnancy test, result negative: Secondary | ICD-10-CM | POA: Insufficient documentation

## 2013-04-07 DIAGNOSIS — R1084 Generalized abdominal pain: Secondary | ICD-10-CM | POA: Insufficient documentation

## 2013-04-07 LAB — URINALYSIS, ROUTINE W REFLEX MICROSCOPIC
Glucose, UA: NEGATIVE mg/dL
Hgb urine dipstick: NEGATIVE
Specific Gravity, Urine: 1.028 (ref 1.005–1.030)
pH: 6 (ref 5.0–8.0)

## 2013-04-07 LAB — URINE MICROSCOPIC-ADD ON

## 2013-04-07 LAB — COMPREHENSIVE METABOLIC PANEL
ALT: 9 U/L (ref 0–35)
Alkaline Phosphatase: 67 U/L (ref 39–117)
Chloride: 106 mEq/L (ref 96–112)
GFR calc Af Amer: >90 mL/min (ref >90)
Glucose, Bld: 69 mg/dL — ABNORMAL LOW (ref 70–99)
Potassium: 3.5 mEq/L (ref 3.5–5.1)
Sodium: 139 mEq/L (ref 135–145)
Total Bilirubin: 0.5 mg/dL (ref 0.3–1.2)
Total Protein: 7.4 g/dL (ref 6.0–8.3)

## 2013-04-07 LAB — CBC WITH DIFFERENTIAL/PLATELET
Basophils Absolute: 0 10*3/uL (ref 0.0–0.1)
Basophils Relative: 0 % (ref 0–1)
Eosinophils Relative: 1 % (ref 0–5)
HCT: 35.6 % — ABNORMAL LOW (ref 36.0–46.0)
MCHC: 34.8 g/dL (ref 30.0–36.0)
MCV: 81.8 fL (ref 78.0–100.0)
Monocytes Absolute: 0.4 10*3/uL (ref 0.1–1.0)
Neutro Abs: 2.1 10*3/uL (ref 1.7–7.7)
RDW: 14.5 % (ref 11.5–15.5)

## 2013-04-07 LAB — POCT PREGNANCY, URINE: Preg Test, Ur: NEGATIVE

## 2013-04-07 MED ORDER — OXYCODONE-ACETAMINOPHEN 5-325 MG PO TABS
1.0000 | ORAL_TABLET | Freq: Once | ORAL | Status: AC
  Administered 2013-04-07: 1 via ORAL
  Filled 2013-04-07: qty 1

## 2013-04-07 MED ORDER — ONDANSETRON 4 MG PO TBDP
4.0000 mg | ORAL_TABLET | Freq: Once | ORAL | Status: AC
  Administered 2013-04-07: 4 mg via ORAL
  Filled 2013-04-07: qty 1

## 2013-04-07 MED ORDER — DICYCLOMINE HCL 20 MG PO TABS
20.0000 mg | ORAL_TABLET | Freq: Once | ORAL | Status: AC
  Administered 2013-04-07: 20 mg via ORAL
  Filled 2013-04-07: qty 1

## 2013-04-07 MED ORDER — DIPHENHYDRAMINE HCL 25 MG PO CAPS
25.0000 mg | ORAL_CAPSULE | Freq: Once | ORAL | Status: AC
  Administered 2013-04-07: 25 mg via ORAL
  Filled 2013-04-07: qty 1

## 2013-04-07 MED ORDER — OXYCODONE-ACETAMINOPHEN 5-325 MG PO TABS: 1.0000 | ORAL_TABLET | Freq: Four times a day (QID) | ORAL | Status: DC | PRN

## 2013-04-07 MED ORDER — ONDANSETRON HCL 4 MG PO TABS: 4.0000 mg | ORAL_TABLET | Freq: Four times a day (QID) | ORAL | Status: DC

## 2013-04-07 NOTE — ED Notes (Signed)
Patient transported to X-ray 

## 2013-04-07 NOTE — ED Provider Notes (Signed)
CSN: [id number]     Arrival date & time 04/07/13  1429 History   First MD Initiated Contact with Patient 04/07/13 1711     Chief Complaint  Patient presents with  . Abdominal Pain   (Consider location/radiation/quality/duration/timing/severity/associated sxs/prior Treatment) HPI  Amy Scott is a 22 y.o.female without any significant PMH presents to the ER with complaints of abdominal pain for 1 month that is constant but waxes and wanes. She describes it as a pulling sensation all over her abdomen with vomiting and small amount of diarrhea. No vaginal discharge or bleeding and no dysuria or hematuria. The pain does not localize anywhere. She comes in today because she felt that the pain was worse then normal. She had an appendectomy in the past. She has not seen another provider for this issue. No fevers, chills, weakness, weight loss, confusion or URI symptoms.    Past Medical History  Diagnosis Date  . No pertinent past medical history   . WUJWJXBJ(478.2)    Past Surgical History  Procedure Laterality Date  . Appendectomy    . Mouth surgery     No family history on file. History  Substance Use Topics  . Smoking status: Current Every Day Smoker -- 0.25 packs/day    Types: Cigarettes  . Smokeless tobacco: Never Used  . Alcohol Use: No   OB History   Grav Para Term Preterm Abortions TAB SAB Ect Mult Living   2 1 1       1      Review of Systems The patient denies anorexia, fever, weight loss,, vision loss, decreased hearing, hoarseness, chest pain, syncope, dyspnea on exertion, peripheral edema, balance deficits, hemoptysis, abdominal pain, melena, hematochezia, severe indigestion/heartburn, hematuria, incontinence, genital sores, muscle weakness, suspicious skin lesions, transient blindness, difficulty walking, depression, unusual weight change, abnormal bleeding, enlarged lymph nodes, angioedema, and breast masses.  Allergies  Morphine and related  Home Medications    Current Outpatient Rx  Name  Route  Sig  Dispense  Refill  . Acetaminophen (TYLENOL PO)   Oral   Take 2 tablets by mouth daily as needed (for headache).         Marland Kitchen ibuprofen (ADVIL,MOTRIN) 200 MG tablet   Oral   Take 200 mg by mouth every 6 (six) hours as needed for headache.         . naproxen sodium (ANAPROX) 220 MG tablet   Oral   Take 220 mg by mouth 2 (two) times daily as needed (for headache).         . ondansetron (ZOFRAN) 4 MG tablet   Oral   Take 1 tablet (4 mg total) by mouth every 6 (six) hours.   12 tablet   0   . oxyCODONE-acetaminophen (PERCOCET/ROXICET) 5-325 MG per tablet   Oral   Take 1 tablet by mouth every 6 (six) hours as needed for severe pain.   15 tablet   0    BP 105/60  Pulse 61  Temp(Src) 97.7 F (36.5 C) (Oral)  Resp 14  Ht 5\' 4"  (1.626 m)  Wt 120 lb 8 oz (54.658 kg)  BMI 20.67 kg/m2  SpO2 99%  LMP 03/26/2013  Breastfeeding? Unknown Physical Exam  Nursing note and vitals reviewed. Constitutional: She appears well-developed and well-nourished. No distress.  HENT:  Head: Normocephalic and atraumatic.  Eyes: Pupils are equal, round, and reactive to light.  Neck: Normal range of motion. Neck supple.  Cardiovascular: Normal rate and regular rhythm.   Pulmonary/Chest: Effort normal.  Abdominal: Soft. There is tenderness (diffuse mild to moderate tenderness).  Neurological: She is alert.  Skin: Skin is warm and dry.    ED Course  Procedures (including critical care time) Labs Review Labs Reviewed  URINALYSIS, ROUTINE W REFLEX MICROSCOPIC - Abnormal; Notable for the following:    APPearance CLOUDY (*)    Bilirubin Urine SMALL (*)    Ketones, ur 15 (*)    Leukocytes, UA SMALL (*)    All other components within normal limits  URINE MICROSCOPIC-ADD ON - Abnormal; Notable for the following:    Squamous Epithelial / LPF FEW (*)    Bacteria, UA FEW (*)    All other components within normal limits  COMPREHENSIVE METABOLIC PANEL -  Abnormal; Notable for the following:    Glucose, Bld 69 (*)    All other components within normal limits  CBC WITH DIFFERENTIAL - Abnormal; Notable for the following:    HCT 35.6 (*)    Neutrophils Relative % 39 (*)    Lymphocytes Relative 54 (*)    All other components within normal limits  URINE CULTURE  LIPASE, BLOOD  POCT PREGNANCY, URINE   Imaging Review Dg Abd 2 Views  04/07/2013   CLINICAL DATA:  Abdominal pain for 3 weeks, getting worse today with nausea and vomiting  EXAM: ABDOMEN - 2 VIEW  COMPARISON:  None.  FINDINGS: The bowel gas pattern is normal. There is no evidence of free air. No radio-opaque calculi or other significant radiographic abnormality is seen.  IMPRESSION: Negative.   Electronically Signed   By: Esperanza Heir M.D.   On: 04/07/2013 19:10    EKG Interpretation   None       MDM   1. Abdominal  pain, other specified site    Patients pain got better with pain medication in the ER. Her labs and xray are reassuring, chronic abdominal pain with no localization and previous appendectomy.   Discussed results with patient and will giver her pain medication and referral to [facility]. Pt is agreeable and reassured by her results.  22 y.o.Malan Fizer's evaluation in the Emergency Department is complete. It has been determined that no acute conditions requiring further emergency intervention are present at this time. The patient/guardian have been advised of the diagnosis and plan. We have discussed signs and symptoms that warrant return to the ED, such as changes or worsening in symptoms.  Vital signs are stable at discharge. Filed Vitals:   04/07/13 1919  BP: 105/60  Pulse: 61  Temp: 97.7 F (36.5 C)  Resp: 14    Patient/guardian has voiced understanding and agreed to follow-up with the PCP or specialist.     Dorthula Matas, PA-C 04/07/13 2003

## 2013-04-07 NOTE — ED Notes (Addendum)
Pt reports pain in her abdomen since Oct. 1

## 2013-04-07 NOTE — ED Notes (Addendum)
Pt reports belly pain diffuse and headaches for over a month. Pain comes and goes. Has taken several preg tests and they were negative. Had a miscarriage in May. Reports vomiting everyday.

## 2013-04-07 NOTE — ED Notes (Signed)
PA at bedside.

## 2013-04-08 LAB — URINE CULTURE

## 2013-04-10 ENCOUNTER — Emergency Department (HOSPITAL_COMMUNITY)
Admission: EM | Admit: 2013-04-10 | Discharge: 2013-04-10 | Disposition: A | Discharge status: Home / Self Care | Payer: Medicaid Other | Attending: Emergency Medicine | Admitting: Emergency Medicine

## 2013-04-10 ENCOUNTER — Encounter (HOSPITAL_COMMUNITY): Payer: Self-pay | Admitting: Emergency Medicine

## 2013-04-10 ENCOUNTER — Emergency Department (HOSPITAL_COMMUNITY): Payer: Medicaid Other

## 2013-04-10 DIAGNOSIS — Z9089 Acquired absence of other organs: Secondary | ICD-10-CM | POA: Insufficient documentation

## 2013-04-10 DIAGNOSIS — R110 Nausea: Secondary | ICD-10-CM | POA: Insufficient documentation

## 2013-04-10 DIAGNOSIS — F17200 Nicotine dependence, unspecified, uncomplicated: Secondary | ICD-10-CM | POA: Insufficient documentation

## 2013-04-10 DIAGNOSIS — Z3202 Encounter for pregnancy test, result negative: Secondary | ICD-10-CM | POA: Insufficient documentation

## 2013-04-10 DIAGNOSIS — Z79899 Other long term (current) drug therapy: Secondary | ICD-10-CM | POA: Insufficient documentation

## 2013-04-10 DIAGNOSIS — R1084 Generalized abdominal pain: Secondary | ICD-10-CM | POA: Insufficient documentation

## 2013-04-10 DIAGNOSIS — R109 Unspecified abdominal pain: Secondary | ICD-10-CM

## 2013-04-10 LAB — URINALYSIS, ROUTINE W REFLEX MICROSCOPIC
Glucose, UA: NEGATIVE mg/dL
Hgb urine dipstick: NEGATIVE
Ketones, ur: 15 mg/dL — AB
Nitrite: NEGATIVE
Protein, ur: NEGATIVE mg/dL
pH: 6.5 (ref 5.0–8.0)

## 2013-04-10 LAB — COMPREHENSIVE METABOLIC PANEL
ALT: 9 U/L (ref 0–35)
AST: 15 U/L (ref 0–37)
Albumin: 4.1 g/dL (ref 3.5–5.2)
Alkaline Phosphatase: 80 U/L (ref 39–117)
CO2: 24 mEq/L (ref 19–32)
Chloride: 106 mEq/L (ref 96–112)
Creatinine, Ser: 0.64 mg/dL (ref 0.50–1.10)
GFR calc Af Amer: >90 mL/min (ref >90)
GFR calc non Af Amer: >90 mL/min (ref >90)
Potassium: 3.9 mEq/L (ref 3.5–5.1)
Sodium: 140 mEq/L (ref 135–145)
Total Bilirubin: 0.3 mg/dL (ref 0.3–1.2)

## 2013-04-10 LAB — URINE MICROSCOPIC-ADD ON

## 2013-04-10 LAB — CBC WITH DIFFERENTIAL/PLATELET
Basophils Absolute: 0 10*3/uL (ref 0.0–0.1)
Basophils Relative: 0 % (ref 0–1)
HCT: 38.2 % (ref 36.0–46.0)
Lymphocytes Relative: 62 % — ABNORMAL HIGH (ref 12–46)
Monocytes Absolute: 0.4 10*3/uL (ref 0.1–1.0)
Neutro Abs: 1.4 10*3/uL — ABNORMAL LOW (ref 1.7–7.7)
Neutrophils Relative %: 29 % — ABNORMAL LOW (ref 43–77)
Platelets: 247 10*3/uL (ref 150–400)
RDW: 14.4 % (ref 11.5–15.5)
WBC: 4.8 10*3/uL (ref 4.0–10.5)

## 2013-04-10 MED ORDER — RANITIDINE HCL 150 MG PO CAPS: 150.0000 mg | ORAL_CAPSULE | Freq: Every day | ORAL | Status: DC

## 2013-04-10 MED ORDER — SODIUM CHLORIDE 0.9 % IV BOLUS (SEPSIS)
1000.0000 mL | Freq: Once | INTRAVENOUS | Status: AC
  Administered 2013-04-10: 1000 mL via INTRAVENOUS

## 2013-04-10 MED ORDER — PROMETHAZINE HCL 25 MG PO TABS: 25.0000 mg | ORAL_TABLET | Freq: Four times a day (QID) | ORAL | Status: DC | PRN

## 2013-04-10 MED ORDER — IOHEXOL 300 MG/ML  SOLN
80.0000 mL | Freq: Once | INTRAMUSCULAR | Status: AC | PRN
  Administered 2013-04-10: 80 mL via INTRAVENOUS

## 2013-04-10 MED ORDER — PANTOPRAZOLE SODIUM 40 MG IV SOLR
40.0000 mg | Freq: Once | INTRAVENOUS | Status: AC
  Administered 2013-04-10: 40 mg via INTRAVENOUS
  Filled 2013-04-10: qty 40

## 2013-04-10 MED ORDER — IOHEXOL 300 MG/ML  SOLN
50.0000 mL | Freq: Once | INTRAMUSCULAR | Status: AC | PRN
  Administered 2013-04-10: 50 mL via ORAL

## 2013-04-10 MED ORDER — ONDANSETRON HCL 4 MG/2ML IJ SOLN
4.0000 mg | Freq: Once | INTRAMUSCULAR | Status: AC
  Administered 2013-04-10: 4 mg via INTRAVENOUS
  Filled 2013-04-10: qty 2

## 2013-04-10 MED ORDER — HYDROMORPHONE HCL PF 1 MG/ML IJ SOLN
1.0000 mg | Freq: Once | INTRAMUSCULAR | Status: AC
  Administered 2013-04-10: 1 mg via INTRAVENOUS
  Filled 2013-04-10: qty 1

## 2013-04-10 NOTE — ED Provider Notes (Signed)
CSN: [id number]     Arrival date & time 04/10/13  1947 History   First MD Initiated Contact with Patient 04/10/13 2046     Chief Complaint  Patient presents with  . Abdominal Pain   (Consider location/radiation/quality/duration/timing/severity/associated sxs/prior Treatment) Patient is a 22 y.o. female presenting with abdominal pain. The history is provided by the patient (the pt complains of vomiting and abd pain).  Abdominal Pain Pain location:  Generalized Pain quality: aching   Pain radiates to:  Does not radiate Pain severity:  Mild Onset quality:  Gradual Timing:  Constant Progression:  Resolved Chronicity:  New Context: not alcohol use   Associated symptoms: nausea   Associated symptoms: no chest pain, no cough, no diarrhea, no fatigue and no hematuria     Past Medical History  Diagnosis Date  . No pertinent past medical history   . WUJWJXBJ(478.2)    Past Surgical History  Procedure Laterality Date  . Appendectomy    . Mouth surgery     No family history on file. History  Substance Use Topics  . Smoking status: Current Every Day Smoker -- 0.25 packs/day    Types: Cigarettes  . Smokeless tobacco: Never Used  . Alcohol Use: No   OB History   Grav Para Term Preterm Abortions TAB SAB Ect Mult Living   2 1 1       1      Review of Systems  Constitutional: Negative for appetite change and fatigue.  HENT: Negative for congestion, ear discharge and sinus pressure.   Eyes: Negative for discharge.  Respiratory: Negative for cough.   Cardiovascular: Negative for chest pain.  Gastrointestinal: Positive for nausea and abdominal pain. Negative for diarrhea.  Genitourinary: Negative for frequency and hematuria.  Musculoskeletal: Negative for back pain.  Skin: Negative for rash.  Neurological: Negative for seizures and headaches.  Psychiatric/Behavioral: Negative for hallucinations.    Allergies  Morphine and related  Home Medications   Current Outpatient Rx   Name  Route  Sig  Dispense  Refill  . ibuprofen (ADVIL,MOTRIN) 200 MG tablet   Oral   Take 200 mg by mouth every 6 (six) hours as needed for headache.         . promethazine (PHENERGAN) 25 MG tablet   Oral   Take 1 tablet (25 mg total) by mouth every 6 (six) hours as needed for nausea or vomiting.   15 tablet   0   . ranitidine (ZANTAC) 150 MG capsule   Oral   Take 1 capsule (150 mg total) by mouth daily.   30 capsule   0    BP 103/71  Pulse 69  Temp(Src) 98.8 F (37.1 C) (Oral)  Resp 20  SpO2 97%  LMP 03/26/2013  Breastfeeding? No Physical Exam  Constitutional: She is oriented to person, place, and time. She appears well-developed.  HENT:  Head: Normocephalic.  Eyes: Conjunctivae and EOM are normal. No scleral icterus.  Neck: Neck supple. No thyromegaly present.  Cardiovascular: Normal rate and regular rhythm.  Exam reveals no gallop and no friction rub.   No murmur heard. Pulmonary/Chest: No stridor. She has no wheezes. She has no rales. She exhibits no tenderness.  Abdominal: She exhibits no distension. There is tenderness. There is no rebound.  Musculoskeletal: Normal range of motion. She exhibits no edema.  Lymphadenopathy:    She has no cervical adenopathy.  Neurological: She is oriented to person, place, and time. She exhibits normal muscle tone. Coordination normal.  Skin:  No rash noted. No erythema.  Psychiatric: She has a normal mood and affect. Her behavior is normal.    ED Course  Procedures (including critical care time) Labs Review Labs Reviewed  CBC WITH DIFFERENTIAL - Abnormal; Notable for the following:    Neutrophils Relative % 29 (*)    Neutro Abs 1.4 (*)    Lymphocytes Relative 62 (*)    All other components within normal limits  URINALYSIS, ROUTINE W REFLEX MICROSCOPIC - Abnormal; Notable for the following:    Color, Urine AMBER (*)    Specific Gravity, Urine 1.032 (*)    Bilirubin Urine SMALL (*)    Ketones, ur 15 (*)    Leukocytes,  UA TRACE (*)    All other components within normal limits  COMPREHENSIVE METABOLIC PANEL  LIPASE, BLOOD  URINE MICROSCOPIC-ADD ON  POCT PREGNANCY, URINE   Imaging Review Ct Abdomen Pelvis W Contrast  04/10/2013   CLINICAL DATA:  Abdominal pain for 1 month. Nausea and vomiting.  EXAM: CT ABDOMEN AND PELVIS WITH CONTRAST  TECHNIQUE: Multidetector CT imaging of the abdomen and pelvis was performed using the standard protocol following bolus administration of intravenous contrast.  CONTRAST:  80 mL of Omnipaque 300 IV contrast  COMPARISON:  CT of the abdomen and pelvis performed 05/30/2007  FINDINGS: The visualized lung bases are clear.  The liver and spleen are unremarkable in appearance. The gallbladder is within normal limits. The pancreas and adrenal glands are unremarkable.  The kidneys are unremarkable in appearance. There is no evidence of hydronephrosis. No renal or ureteral stones are seen. No perinephric stranding is appreciated.  No free fluid is identified. The small bowel is unremarkable in appearance. The stomach is within normal limits. No acute vascular abnormalities are seen. Incidental note is made of bilateral IVCs.  The patient is status post appendectomy. The colon is partially filled with air and stool, and is unremarkable in appearance.  The bladder is decompressed and not well assessed. The uterus is grossly unremarkable in appearance. The ovaries are not well assessed due to adjacent bowel loops, but appear grossly unremarkable. No suspicious adnexal masses are seen. No inguinal lymphadenopathy is seen.  No acute osseous abnormalities are identified.  IMPRESSION: 1. No acute abnormality seen within the abdomen or pelvis. 2. Incidental note of bilateral IVCs.   Electronically Signed   By: Roanna Raider M.D.   On: 04/10/2013 22:17    EKG Interpretation   None       MDM   1. Abdominal pain        Benny Lennert, MD 04/10/13 2238

## 2013-04-10 NOTE — ED Notes (Signed)
Pt comes to the ER tonight for evaluation of abd pain that has been off and on for approx 1 month; pt states that she was seen at [facility] on Fri for same thing but that she feels like the symptoms have gotten worse; pt states that she has had an increase in Nausea and Vomiting; minimal diarrhea; pt reports multiple episodes of vomiting today

## 2013-04-10 NOTE — Progress Notes (Signed)
Patient confirms she does not have any insurance or a pcp.  [facility] provided patient with a list of pcps who accept self pay patients, list of discounted pharmacies and wbsite needymeds.org for medication assistance, financial assistance in the community such as [employer]. Information regarding Medicaid and phone number to inquire about the orange card.  Patient thankful for resources.  No further needs at thid time.

## 2013-04-11 NOTE — ED Provider Notes (Signed)
Medical screening examination/treatment/procedure(s) were conducted as a shared visit with non-physician practitioner(s) and myself.  I personally evaluated the patient during the encounter.  EKG Interpretation   None      No acute abdomen. White count normal. Abdominal x-ray negative  Donnetta Hutching, MD 04/11/13 [phone]

## 2013-06-24 ENCOUNTER — Emergency Department (HOSPITAL_COMMUNITY)
Admission: EM | Admit: 2013-06-24 | Discharge: 2013-06-24 | Disposition: A | Discharge status: Home / Self Care | Payer: Medicaid Other | Attending: Emergency Medicine | Admitting: Emergency Medicine

## 2013-06-24 ENCOUNTER — Encounter (HOSPITAL_COMMUNITY): Payer: Self-pay | Admitting: Emergency Medicine

## 2013-06-24 DIAGNOSIS — N898 Other specified noninflammatory disorders of vagina: Secondary | ICD-10-CM | POA: Insufficient documentation

## 2013-06-24 DIAGNOSIS — IMO0002 Reserved for concepts with insufficient information to code with codable children: Secondary | ICD-10-CM

## 2013-06-24 DIAGNOSIS — Z3202 Encounter for pregnancy test, result negative: Secondary | ICD-10-CM | POA: Insufficient documentation

## 2013-06-24 DIAGNOSIS — F17200 Nicotine dependence, unspecified, uncomplicated: Secondary | ICD-10-CM | POA: Insufficient documentation

## 2013-06-24 DIAGNOSIS — Z113 Encounter for screening for infections with a predominantly sexual mode of transmission: Secondary | ICD-10-CM

## 2013-06-24 DIAGNOSIS — T7421XA Adult sexual abuse, confirmed, initial encounter: Secondary | ICD-10-CM | POA: Insufficient documentation

## 2013-06-24 LAB — POCT PREGNANCY, URINE: Preg Test, Ur: NEGATIVE

## 2013-06-24 LAB — WET PREP, GENITAL
CLUE CELLS WET PREP: NONE SEEN
Trich, Wet Prep: NONE SEEN

## 2013-06-24 MED ORDER — FLUCONAZOLE 150 MG PO TABS
150.0000 mg | ORAL_TABLET | Freq: Once | ORAL | Status: AC
  Administered 2013-06-24: 150 mg via ORAL
  Filled 2013-06-24: qty 1

## 2013-06-24 MED ORDER — CEFTRIAXONE SODIUM 250 MG IJ SOLR
250.0000 mg | Freq: Once | INTRAMUSCULAR | Status: AC
  Administered 2013-06-24: 250 mg via INTRAMUSCULAR
  Filled 2013-06-24: qty 250

## 2013-06-24 MED ORDER — AZITHROMYCIN 250 MG PO TABS
1000.0000 mg | ORAL_TABLET | Freq: Once | ORAL | Status: AC
  Administered 2013-06-24: 1000 mg via ORAL
  Filled 2013-06-24: qty 4
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
## 2013-06-24 NOTE — ED Provider Notes (Signed)
Medical screening examination/treatment/procedure(s) were performed by non-physician practitioner and as supervising physician I was immediately available for consultation/collaboration.  EKG Interpretation   None        Ethelda ChickMartha K Linker, MD 06/24/13 2034

## 2013-06-25 LAB — RPR: RPR Ser Ql: NONREACTIVE

## 2013-06-25 LAB — HIV ANTIBODY (ROUTINE TESTING W REFLEX): HIV: NONREACTIVE

## 2013-06-26 LAB — GC/CHLAMYDIA PROBE AMP
CT Probe RNA: POSITIVE — AB
GC PROBE AMP APTIMA: NEGATIVE

## 2013-06-27 NOTE — ED Notes (Signed)
+   Chlamydia Patient treated with Rocephin And Zithromax-[facility] faxed 

## 2013-06-29 ENCOUNTER — Telehealth (HOSPITAL_COMMUNITY): Payer: Self-pay | Admitting: *Deleted

## 2014-04-02 ENCOUNTER — Encounter (HOSPITAL_COMMUNITY): Payer: Self-pay | Admitting: Emergency Medicine

## 2015-05-06 IMAGING — CT CT ABD-PELV W/ CM
1 of 2 series · 15 of 32 positions shown, 19 images · IV contrast (omnipaque)
Comparison: CT of the abdomen and pelvis performed 05/30/2007

CLINICAL DATA: Abdominal pain for 1 month. Nausea and vomiting.

EXAM:
CT ABDOMEN AND PELVIS WITH CONTRAST
TECHNIQUE: Multidetector CT imaging of the abdomen and pelvis was performed
using the standard protocol following bolus administration of
intravenous contrast.
CONTRAST:  80 mL of Omnipaque 300 IV contrast

[Series 2: abd/pel with · axial · 0.74mm/px · z∈[-89,+286]mm · 15 of 83 slices shown, 19 images]
[im 4/83  soft-tissue]
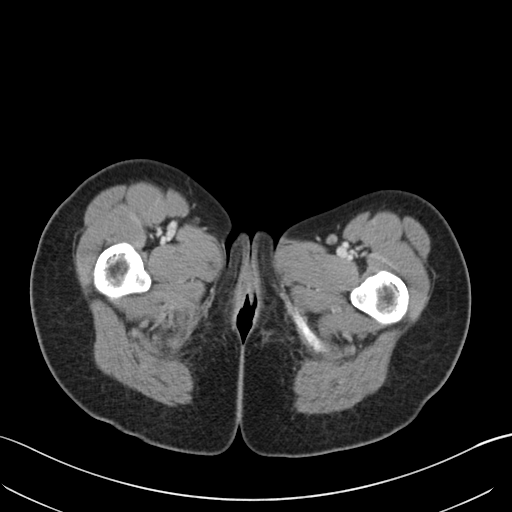
[im 4/83  bone]
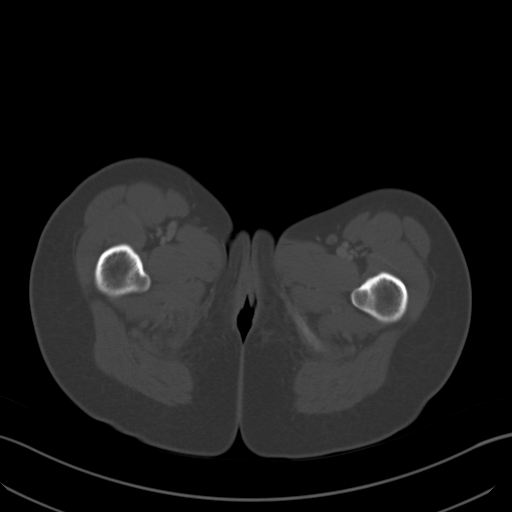
[im 11/83  soft-tissue]
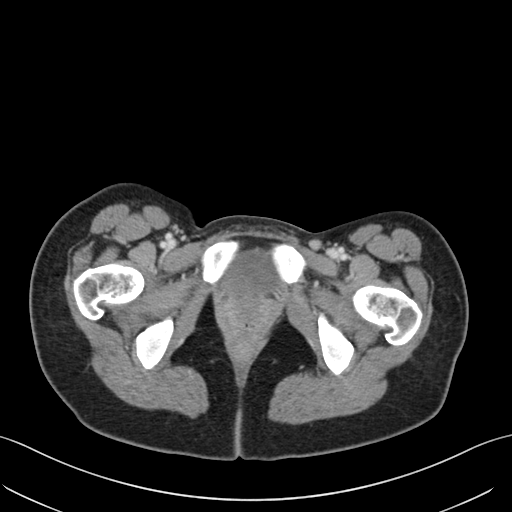
[im 18/83  soft-tissue]
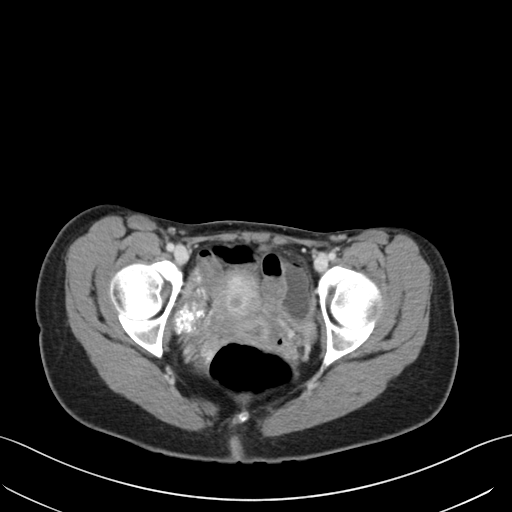
[im 24/83  soft-tissue]
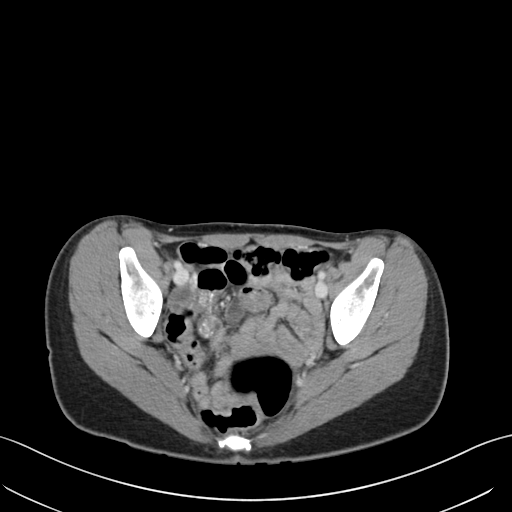
[im 28/83  soft-tissue]
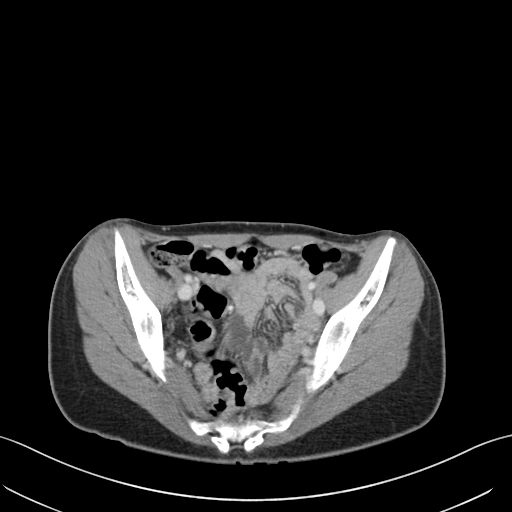
[im 35/83  soft-tissue]
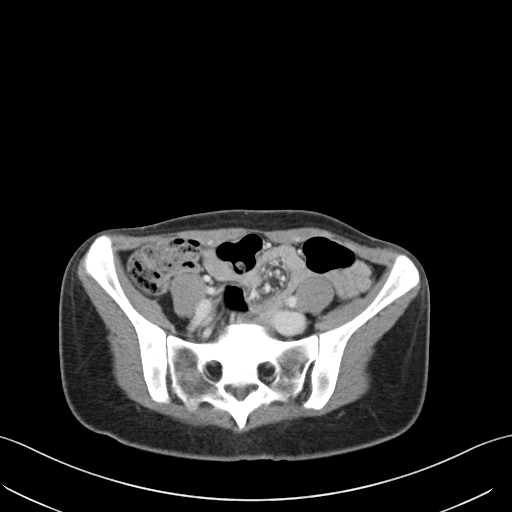
[im 42/83  soft-tissue]
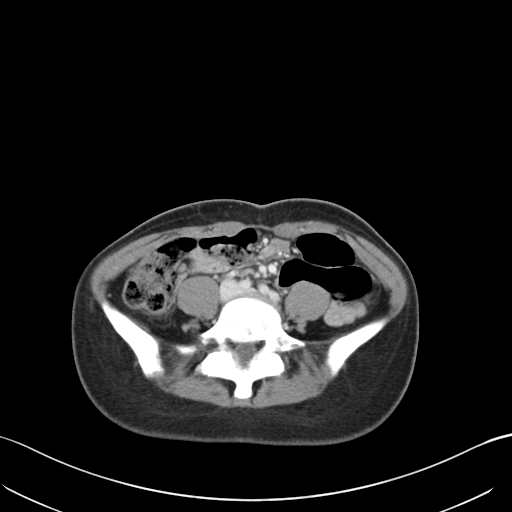
[im 48/83  soft-tissue]
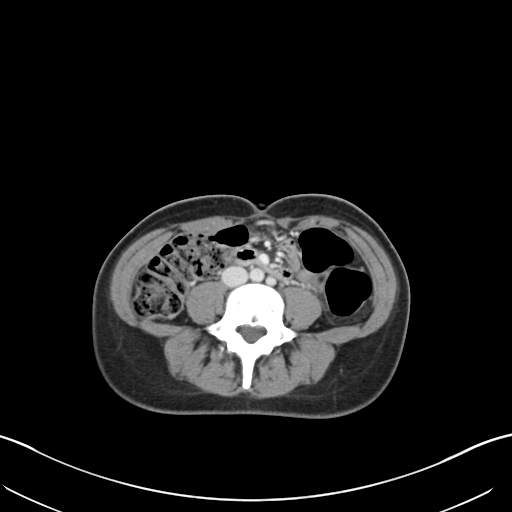
[im 55/83  soft-tissue]
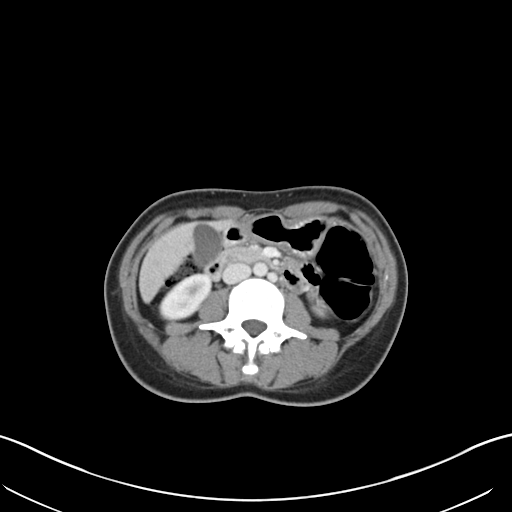
[im 55/83  bone]
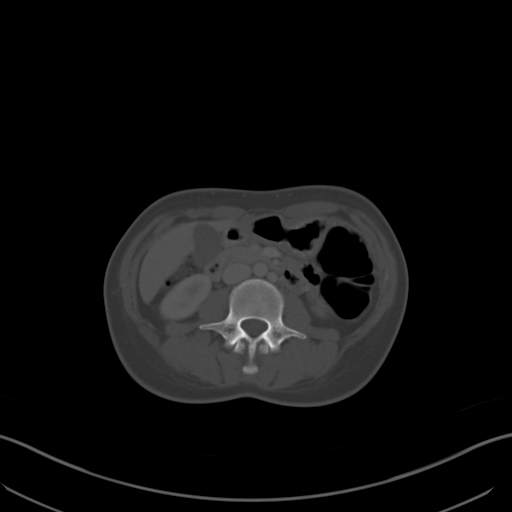
[im 59/83  soft-tissue]
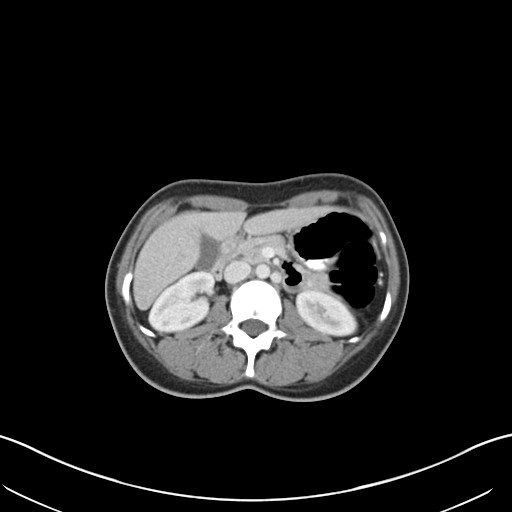
[im 65/83  soft-tissue]
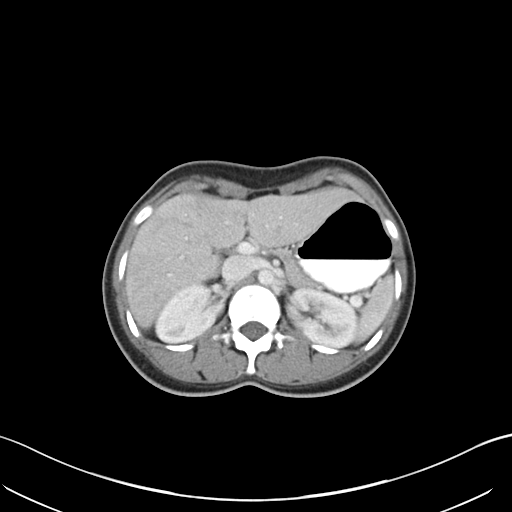
[im 69/83  lung]
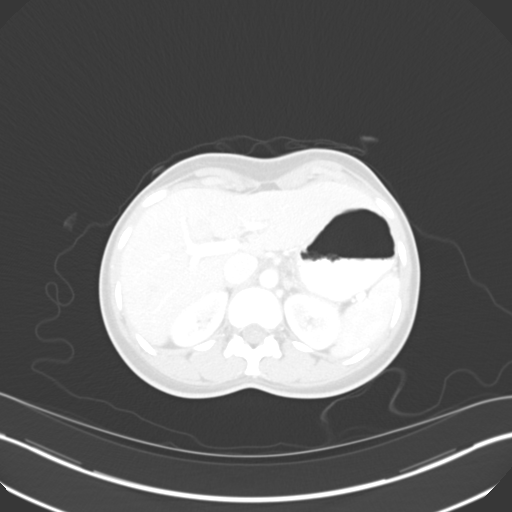
[im 72/83  soft-tissue]
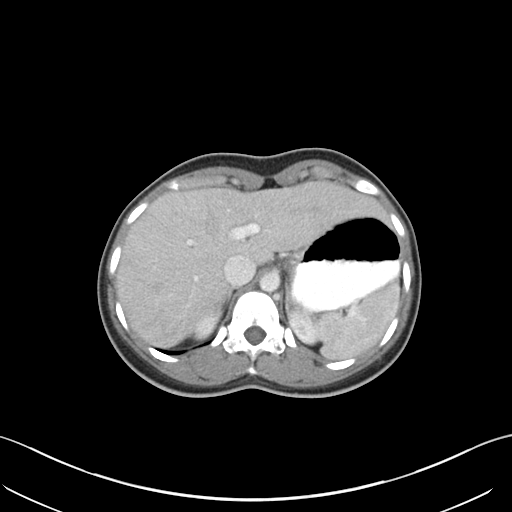
[im 72/83  lung]
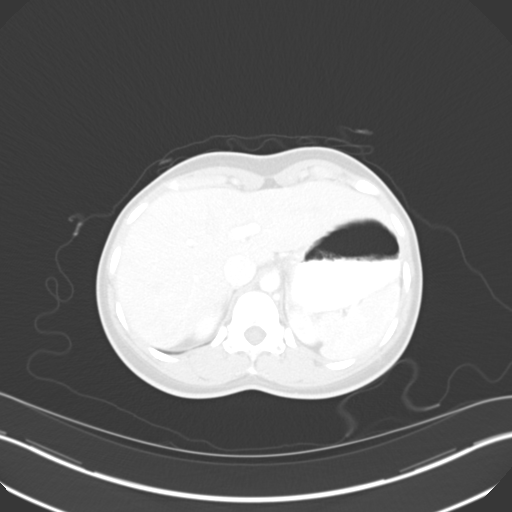
[im 76/83  lung]
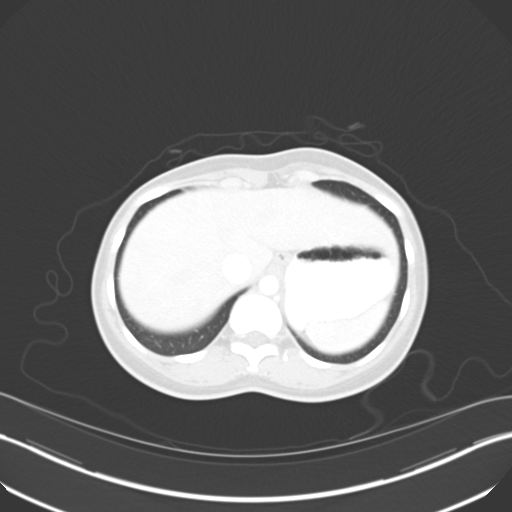
[im 79/83  soft-tissue]
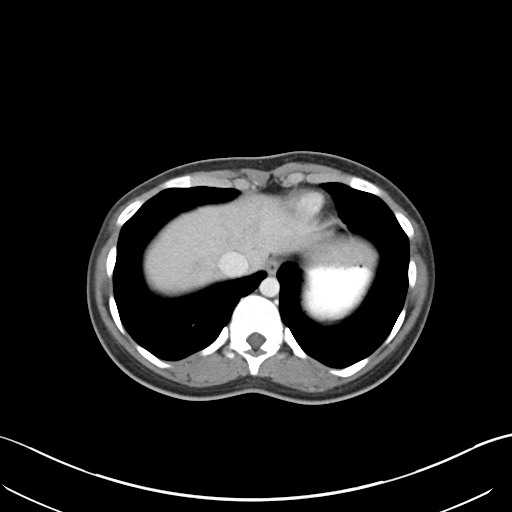
[im 79/83  lung]
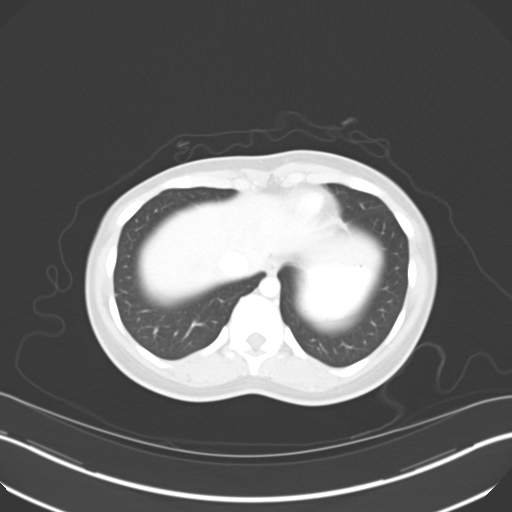

[15 of 32 positions shown; findings below may reference images not displayed]

FINDINGS: The visualized lung bases are clear.

The liver and spleen are unremarkable in appearance. The gallbladder
is within normal limits. The pancreas and adrenal glands are
unremarkable.

The kidneys are unremarkable in appearance. There is no evidence of
hydronephrosis. No renal or ureteral stones are seen. No perinephric
stranding is appreciated.

No free fluid is identified. The small bowel is unremarkable in
appearance. The stomach is within normal limits. No acute vascular
abnormalities are seen. Incidental note is made of bilateral IVCs.

The patient is status post appendectomy. The colon is partially
filled with air and stool, and is unremarkable in appearance.

The bladder is decompressed and not well assessed. The uterus is
grossly unremarkable in appearance. The ovaries are not well
assessed due to adjacent bowel loops, but appear grossly
unremarkable. No suspicious adnexal masses are seen.. No inguinal
lymphadenopathy is seen.

No acute osseous abnormalities are identified.
IMPRESSION: 1. No acute abnormality seen within the abdomen or pelvis.
2. Incidental note of bilateral IVCs.

## 2016-02-11 DIAGNOSIS — Z87891 Personal history of nicotine dependence: Secondary | ICD-10-CM | POA: Insufficient documentation

## 2017-07-02 DIAGNOSIS — A749 Chlamydial infection, unspecified: Secondary | ICD-10-CM | POA: Insufficient documentation

## 2017-07-22 DIAGNOSIS — O1495 Unspecified pre-eclampsia, complicating the puerperium: Secondary | ICD-10-CM | POA: Insufficient documentation

## 2018-05-06 DIAGNOSIS — N10 Acute tubulo-interstitial nephritis: Secondary | ICD-10-CM | POA: Insufficient documentation

## 2021-01-31 ENCOUNTER — Other Ambulatory Visit: Payer: Self-pay

## 2021-01-31 ENCOUNTER — Emergency Department (HOSPITAL_COMMUNITY): Payer: Medicaid Other

## 2021-01-31 ENCOUNTER — Emergency Department (HOSPITAL_COMMUNITY)
Admission: EM | Admit: 2021-01-31 | Discharge: 2021-01-31 | Disposition: A | Discharge status: Home / Self Care | Payer: Medicaid Other | Attending: Emergency Medicine | Admitting: Emergency Medicine

## 2021-01-31 ENCOUNTER — Encounter (HOSPITAL_COMMUNITY): Payer: Self-pay | Admitting: *Deleted

## 2021-01-31 DIAGNOSIS — U071 COVID-19: Secondary | ICD-10-CM

## 2021-01-31 DIAGNOSIS — F17210 Nicotine dependence, cigarettes, uncomplicated: Secondary | ICD-10-CM | POA: Insufficient documentation

## 2021-01-31 DIAGNOSIS — R1010 Upper abdominal pain, unspecified: Secondary | ICD-10-CM | POA: Insufficient documentation

## 2021-01-31 LAB — CBC WITH DIFFERENTIAL/PLATELET
Abs Immature Granulocytes: 0.01 10*3/uL (ref 0.00–0.07)
Basophils Absolute: 0 10*3/uL (ref 0.0–0.1)
Basophils Relative: 0 %
Eosinophils Absolute: 0 10*3/uL (ref 0.0–0.5)
Eosinophils Relative: 0 %
HCT: 35.4 % — ABNORMAL LOW (ref 36.0–46.0)
Hemoglobin: 11.2 g/dL — ABNORMAL LOW (ref 12.0–15.0)
Immature Granulocytes: 0 %
Lymphocytes Relative: 53 %
Lymphs Abs: 2.5 10*3/uL (ref 0.7–4.0)
MCH: 25.7 pg — ABNORMAL LOW (ref 26.0–34.0)
MCHC: 31.6 g/dL (ref 30.0–36.0)
MCV: 81.4 fL (ref 80.0–100.0)
Monocytes Absolute: 0.4 10*3/uL (ref 0.1–1.0)
Monocytes Relative: 9 %
Neutro Abs: 1.8 10*3/uL (ref 1.7–7.7)
Neutrophils Relative %: 38 %
Platelets: 235 10*3/uL (ref 150–400)
RBC: 4.35 MIL/uL (ref 3.87–5.11)
RDW: 18.6 % — ABNORMAL HIGH (ref 11.5–15.5)
WBC: 4.7 10*3/uL (ref 4.0–10.5)
nRBC: 0 % (ref 0.0–0.2)

## 2021-01-31 LAB — COMPREHENSIVE METABOLIC PANEL
ALT: 20 U/L (ref 0–44)
AST: 26 U/L (ref 15–41)
Albumin: 3.7 g/dL (ref 3.5–5.0)
Alkaline Phosphatase: 55 U/L (ref 38–126)
Anion gap: 11 (ref 5–15)
BUN: 15 mg/dL (ref 6–20)
CO2: 21 mmol/L — ABNORMAL LOW (ref 22–32)
Calcium: 9 mg/dL (ref 8.9–10.3)
Chloride: 106 mmol/L (ref 98–111)
Creatinine, Ser: 0.71 mg/dL (ref 0.44–1.00)
GFR, Estimated: >60 mL/min (ref >60)
Glucose, Bld: 78 mg/dL (ref 70–99)
Potassium: 3.7 mmol/L (ref 3.5–5.1)
Sodium: 138 mmol/L (ref 135–145)
Total Bilirubin: 0.6 mg/dL (ref 0.3–1.2)
Total Protein: 7.2 g/dL (ref 6.5–8.1)

## 2021-01-31 LAB — LIPASE, BLOOD: Lipase: 31 U/L (ref 11–51)

## 2021-01-31 LAB — RESP PANEL BY RT-PCR (FLU A&B, COVID) ARPGX2
Influenza A by PCR: NEGATIVE
Influenza B by PCR: NEGATIVE
SARS Coronavirus 2 by RT PCR: POSITIVE — AB

## 2021-01-31 LAB — I-STAT BETA HCG BLOOD, ED (MC, WL, AP ONLY): I-stat hCG, quantitative: <5 m[IU]/mL (ref <5)

## 2021-01-31 MED ORDER — METOCLOPRAMIDE HCL 5 MG/ML IJ SOLN
10.0000 mg | Freq: Once | INTRAMUSCULAR | Status: AC
  Administered 2021-01-31: 10 mg via INTRAVENOUS
  Filled 2021-01-31: qty 2

## 2021-01-31 MED ORDER — DIPHENHYDRAMINE HCL 50 MG/ML IJ SOLN
25.0000 mg | Freq: Once | INTRAMUSCULAR | Status: AC
  Administered 2021-01-31: 25 mg via INTRAVENOUS
  Filled 2021-01-31: qty 1

## 2021-01-31 MED ORDER — SODIUM CHLORIDE 0.9 % IV BOLUS
1000.0000 mL | Freq: Once | INTRAVENOUS | Status: AC
  Administered 2021-01-31: 1000 mL via INTRAVENOUS

## 2021-01-31 MED ORDER — KETOROLAC TROMETHAMINE 15 MG/ML IJ SOLN
15.0000 mg | Freq: Once | INTRAMUSCULAR | Status: AC
  Administered 2021-01-31: 15 mg via INTRAVENOUS
  Filled 2021-01-31: qty 1

## 2021-01-31 NOTE — ED Provider Notes (Signed)
[facility] EMERGENCY DEPARTMENT Provider Note   CSN: [id number] Arrival date & time: 01/31/21  1734     History Chief Complaint  Patient presents with  . Headache    Amy Scott is a 30 y.o. female.  HPI 30 year old female presents with chief complaint of headache.  Started 2 days ago.  It is a frontal headache that is a pressure behind the eyes.  This is typical to prior migraines except it is progressively getting worse and not relieved with Excedrin like typical.  Has felt subjective fever and chills as well as cough, chest pain when breathing and coughing, upper abdominal pain, vomiting, and body aches.  Headache is rated as a 10/10.  No neck stiffness or focal weakness/numbness or vision complaints.  Past Medical History:  Diagnosis Date  . Headache(784.0)   . No pertinent past medical history     There are no problems to display for this patient.   Past Surgical History:  Procedure Laterality Date  . APPENDECTOMY    . MOUTH SURGERY       OB History     Gravida  2   Para  1   Term  1   Preterm      AB      Living  1      SAB      IAB      Ectopic      Multiple      Live Births  1           No family history on file.  Social History   Tobacco Use  . Smoking status: Every Day    Packs/day: 0.25    Types: Cigarettes  . Smokeless tobacco: Never  Substance Use Topics  . Alcohol use: No  . Drug use: No    Home Medications Prior to Admission medications   Not on File    Allergies    Morphine and related  Review of Systems   Review of Systems  Constitutional:  Positive for chills and fever.  Eyes:  Negative for visual disturbance.  Respiratory:  Positive for cough.   Cardiovascular:  Positive for chest pain.  Gastrointestinal:  Positive for abdominal pain and vomiting.  Musculoskeletal:  Positive for myalgias. Negative for neck pain and neck stiffness.  Neurological:  Positive for headaches. Negative  for weakness and numbness.  All other systems reviewed and are negative.  Physical Exam Updated Vital Signs BP 103/72 (BP Location: Right Arm)   Pulse (!) 58   Temp 98.6 F (37 C)   Resp 16   Ht 5\' 4"  (1.626 m)   Wt 49.9 kg   LMP 01/31/2021   SpO2 100%   BMI 18.88 kg/m   Physical Exam Vitals and nursing note reviewed.  Constitutional:      General: She is not in acute distress.    Appearance: She is well-developed. She is not ill-appearing or diaphoretic.  HENT:     Head: Normocephalic and atraumatic.     Right Ear: External ear normal.     Left Ear: External ear normal.     Nose: Nose normal.  Eyes:     General:        Right eye: No discharge.        Left eye: No discharge.     Extraocular Movements: Extraocular movements intact.     Pupils: Pupils are equal, round, and reactive to light.  Cardiovascular:     Rate and  Rhythm: Normal rate and regular rhythm.     Heart sounds: Normal heart sounds.  Pulmonary:     Effort: Pulmonary effort is normal.     Breath sounds: Normal breath sounds.  Abdominal:     Palpations: Abdomen is soft.     Tenderness: There is abdominal tenderness in the right upper quadrant, epigastric area and left upper quadrant.  Musculoskeletal:     Cervical back: Normal range of motion and neck supple. No rigidity.  Skin:    General: Skin is warm and dry.  Neurological:     Mental Status: She is alert.     Comments: CN 3-12 grossly intact. 5/5 strength in all 4 extremities. Grossly normal sensation. Normal finger to nose.   Psychiatric:        Mood and Affect: Mood is not anxious.    ED Results / Procedures / Treatments   Labs (all labs ordered are listed, but only abnormal results are displayed) Labs Reviewed  RESP PANEL BY RT-PCR (FLU A&B, COVID) ARPGX2 - Abnormal; Notable for the following components:      Result Value   SARS Coronavirus 2 by RT PCR POSITIVE (*)    All other components within normal limits  COMPREHENSIVE METABOLIC  PANEL - Abnormal; Notable for the following components:   CO2 21 (*)    All other components within normal limits  CBC WITH DIFFERENTIAL/PLATELET - Abnormal; Notable for the following components:   Hemoglobin 11.2 (*)    HCT 35.4 (*)    MCH 25.7 (*)    RDW 18.6 (*)    All other components within normal limits  LIPASE, BLOOD  URINALYSIS, ROUTINE W REFLEX MICROSCOPIC  I-STAT BETA HCG BLOOD, ED ([facility], [facility], [facility] ONLY)    EKG EKG Interpretation  Date/Time:  Friday January 31 2021 22:39:05 EDT Ventricular Rate:  61 PR Interval:  131 QRS Duration: 82 QT Interval:  433 QTC Calculation: 437 R Axis:   83 Text Interpretation: Sinus rhythm no acute ST/T changes No old tracing to compare Confirmed by Pricilla Loveless [phone]) on 01/31/2021 10:53:19 PM  Radiology DG Chest 2 View  Result Date: 01/31/2021 CLINICAL DATA:  Shortness of breath and headache EXAM: CHEST - 2 VIEW COMPARISON:  None. FINDINGS: The heart size and mediastinal contours are within normal limits. No focal consolidation. No pleural effusion. No pneumothorax. The visualized skeletal structures are unremarkable. IMPRESSION: No active cardiopulmonary disease. Electronically Signed   By: Maudry Mayhew M.D.   On: 01/31/2021 19:48    Procedures Procedures   Medications Ordered in ED Medications  sodium chloride 0.9 % bolus 1,000 mL (1,000 mLs Intravenous New Bag/Given 01/31/21 2240)  metoCLOPramide (REGLAN) injection 10 mg (10 mg Intravenous Given 01/31/21 2224)  diphenhydrAMINE (BENADRYL) injection 25 mg (25 mg Intravenous Given 01/31/21 2224)  ketorolac (TORADOL) 15 MG/ML injection 15 mg (15 mg Intravenous Given 01/31/21 2223)    ED Course  I have reviewed the triage vital signs and the nursing notes.  Pertinent labs & imaging results that were available during my care of the patient were reviewed by me and considered in my medical decision making (see chart for details).    MDM Rules/Calculators/A&P                            Patient's symptoms all seem to be coming from acute COVID-19 infection.  Or her headache is worse than typical, she has no signs or symptoms of acute CNS  infection and is otherwise well-appearing.  Headache is much improved after treatment.  Labs were obtained given the abdominal pain but I think this is probably more than nausea than anything else.  Labs are unremarkable.  Will discharge home with return precautions. Final Clinical Impression(s) / ED Diagnoses Final diagnoses:  COVID-19 virus infection    Rx / DC Orders ED Discharge Orders     None        Pricilla Loveless, MD 01/31/21 [phone]

## 2021-01-31 NOTE — ED Provider Notes (Signed)
Emergency Medicine Provider Triage Evaluation Note  Amy Scott , a 30 y.o. female  was evaluated in triage.  Pt complains of headache x2 days. Has been taking excedrin without relief. Feeling feverish, 2 episodes of emesis yesterday, and intermittent pleuritic chest pain.   Review of Systems  Positive: Fever, cough, congestion, headache, body aches, nausea, vomiting, abdominal pain Negative: blurry vision, diarrhea, constipation  Physical Exam  BP 116/80 (BP Location: Right Arm)   Pulse 77   Temp 98.6 F (37 C)   Resp 16   Ht 5\' 4"  (1.626 m)   Wt 49.9 kg   LMP 01/31/2021   SpO2 100%   BMI 18.88 kg/m  Gen:   Awake, no distress   Resp:  Normal effort  MSK:   Moves extremities without difficulty  Other:    Medical Decision Making  Medically screening exam initiated at 6:46 PM.  Appropriate orders placed.  Jazzlin Clements was informed that the remainder of the evaluation will be completed by another provider, this initial triage assessment does not replace that evaluation, and the importance of remaining in the ED until their evaluation is complete.     Bevelyn Ngo 01/31/21 1851    04/02/21, MD 02/02/21 1521

## 2021-01-31 NOTE — ED Triage Notes (Signed)
The pt has had a headache since Wednesday with hot and cold spells   poss termp lmp now

## 2021-01-31 NOTE — Discharge Instructions (Signed)
If you develop continued, recurrent, or worsening headache, fever, neck stiffness, vomiting, blurry or double vision, weakness or numbness in your arms or legs, trouble speaking, or any other new/concerning symptoms then return to the ER for evaluation.  

## 2021-01-31 NOTE — ED Notes (Signed)
All appropriate discharge materials reviewed at length with patient. Time for questions provided. Pt has no other questions at this time and verbalizes understanding of all provided materials.  

## 2023-02-26 IMAGING — DX DG CHEST 2V
2 series · 2 of 2 positions shown · non-contrast
Comparison: None.

CLINICAL DATA: Shortness of breath and headache

EXAM:
CHEST - 2 VIEW

[w chest pa]
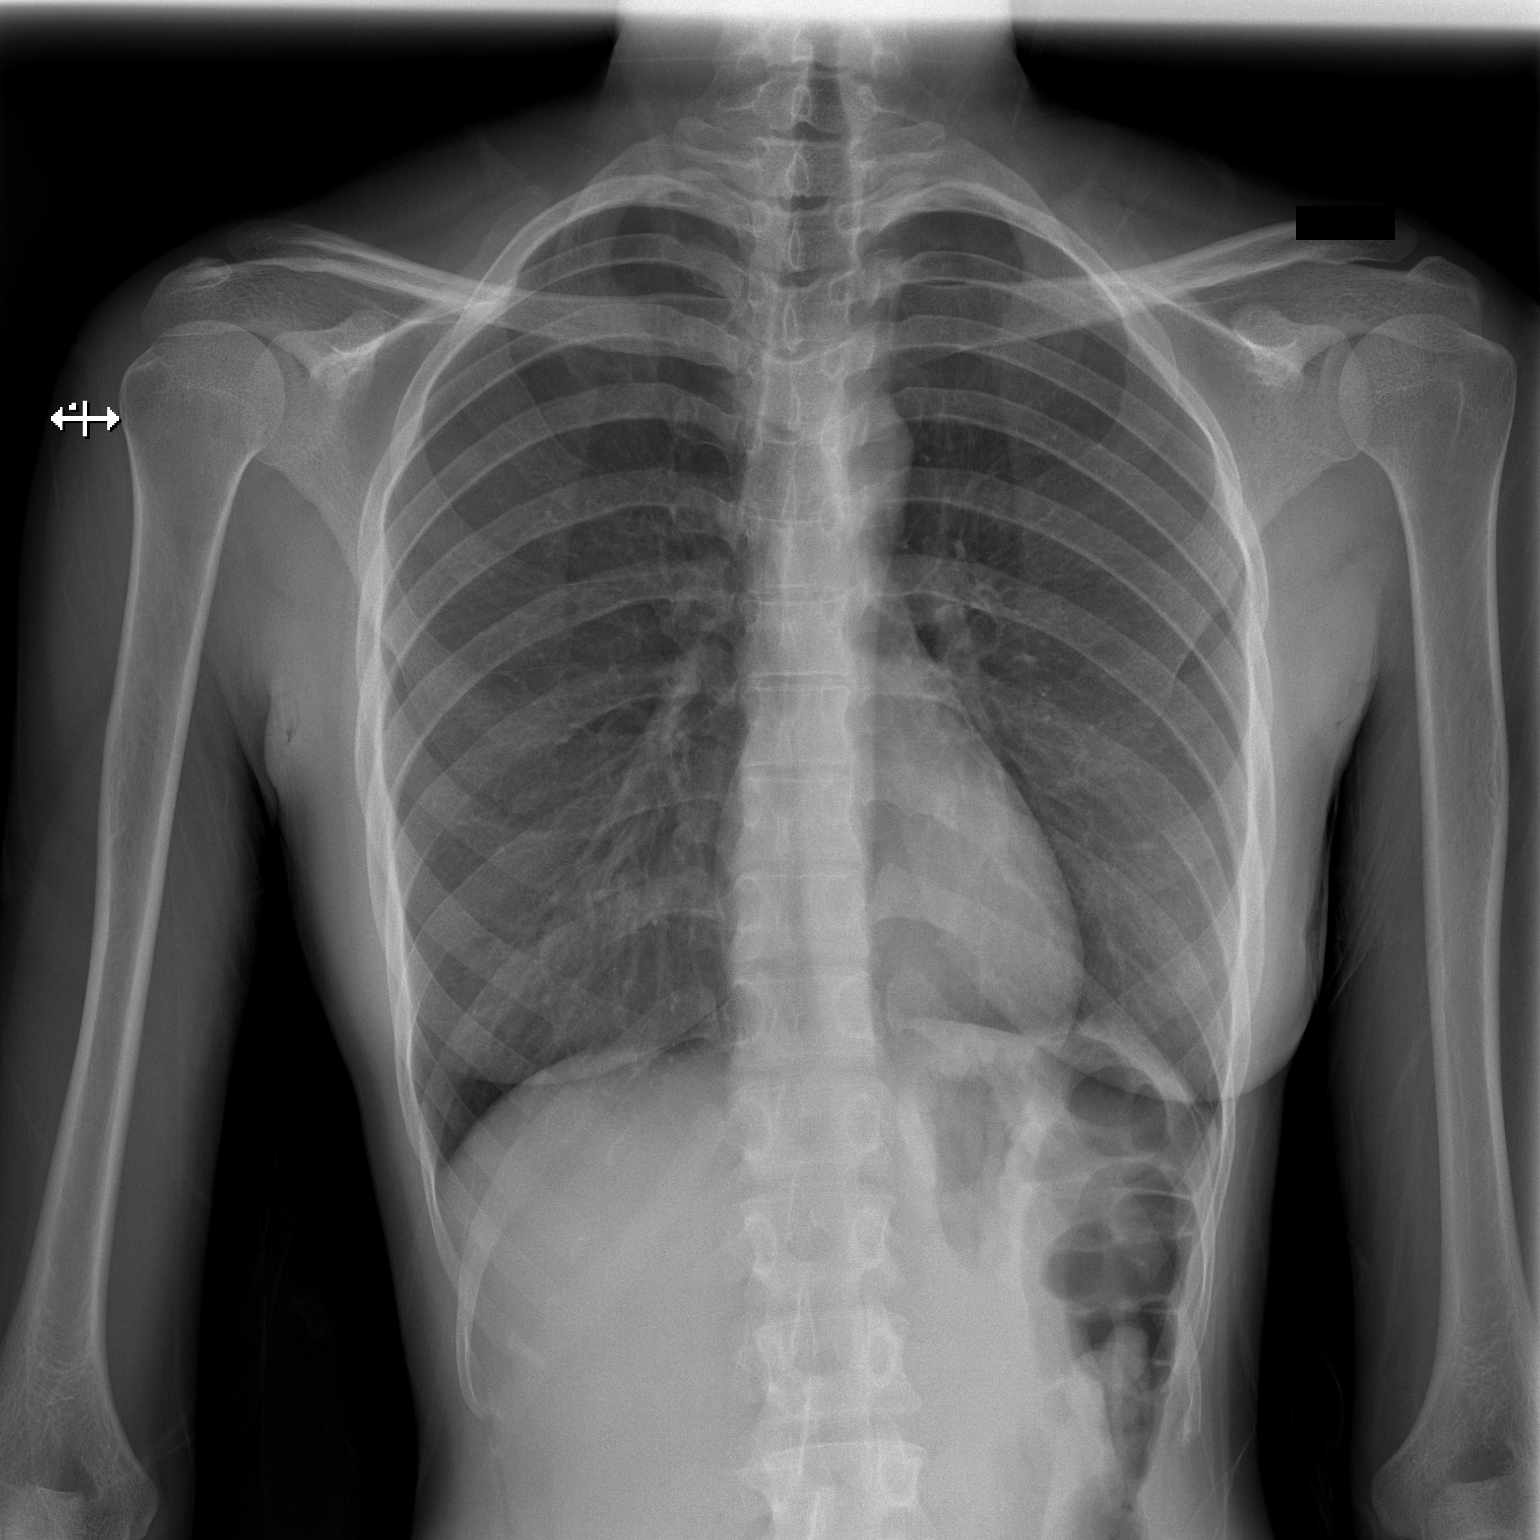

[w chest lat]
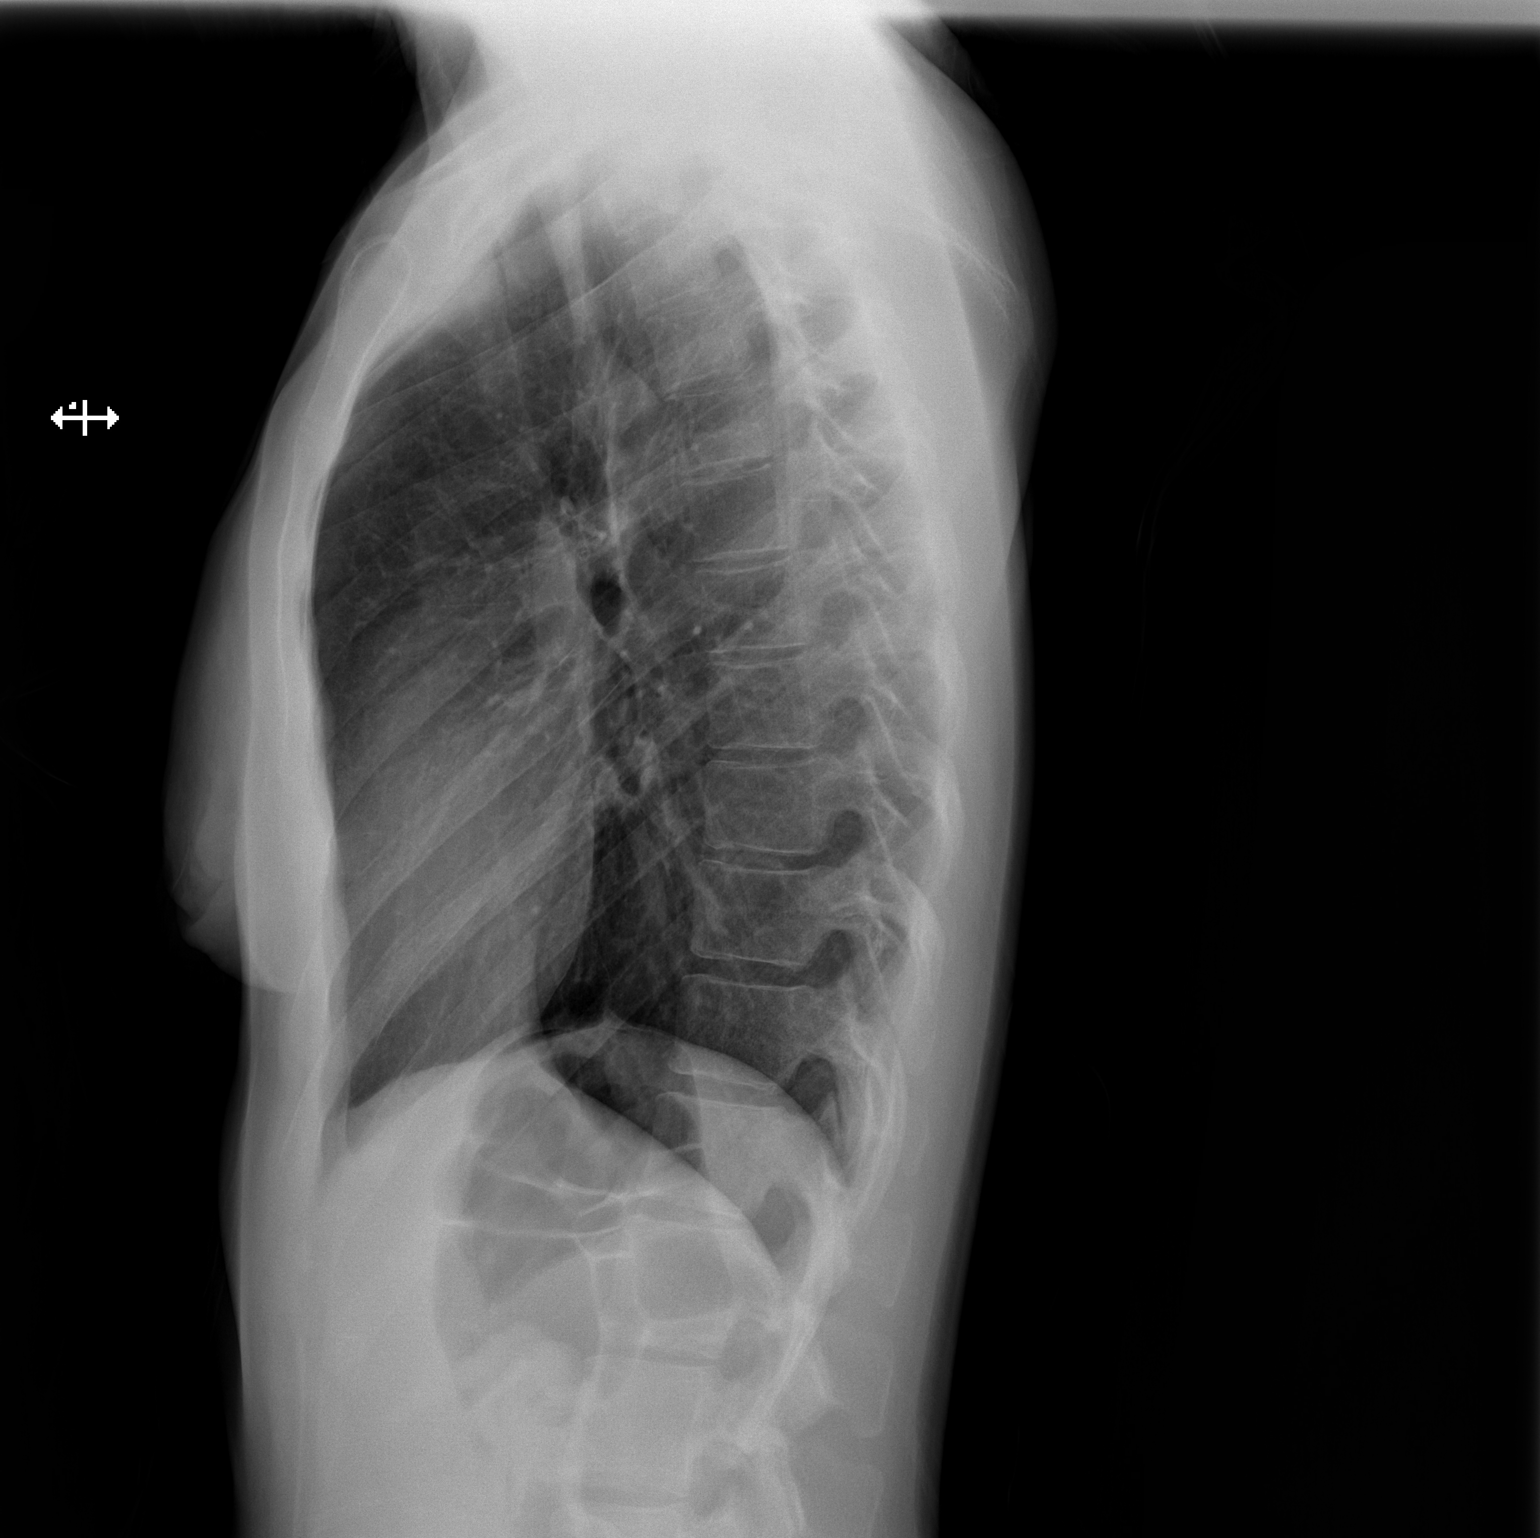

[2 of 2 positions shown; findings below may reference images not displayed]

FINDINGS: The heart size and mediastinal contours are within normal limits. No
focal consolidation. No pleural effusion. No pneumothorax. The
visualized skeletal structures are unremarkable.
IMPRESSION: No active cardiopulmonary disease.

## 2024-02-01 ENCOUNTER — Other Ambulatory Visit: Payer: Self-pay

## 2024-02-01 ENCOUNTER — Encounter (HOSPITAL_BASED_OUTPATIENT_CLINIC_OR_DEPARTMENT_OTHER): Payer: Self-pay

## 2024-02-01 DIAGNOSIS — E876 Hypokalemia: Secondary | ICD-10-CM | POA: Diagnosis present

## 2024-02-01 DIAGNOSIS — N941 Unspecified dyspareunia: Secondary | ICD-10-CM | POA: Diagnosis present

## 2024-02-01 DIAGNOSIS — R948 Abnormal results of function studies of other organs and systems: Secondary | ICD-10-CM | POA: Diagnosis present

## 2024-02-01 DIAGNOSIS — K633 Ulcer of intestine: Secondary | ICD-10-CM | POA: Diagnosis present

## 2024-02-01 DIAGNOSIS — C19 Malignant neoplasm of rectosigmoid junction: Principal | ICD-10-CM | POA: Diagnosis present

## 2024-02-01 DIAGNOSIS — Z87891 Personal history of nicotine dependence: Secondary | ICD-10-CM

## 2024-02-01 DIAGNOSIS — R102 Pelvic and perineal pain: Secondary | ICD-10-CM | POA: Diagnosis present

## 2024-02-01 DIAGNOSIS — K5669 Other partial intestinal obstruction: Secondary | ICD-10-CM | POA: Diagnosis present

## 2024-02-01 DIAGNOSIS — N200 Calculus of kidney: Secondary | ICD-10-CM | POA: Diagnosis present

## 2024-02-01 DIAGNOSIS — K921 Melena: Secondary | ICD-10-CM | POA: Diagnosis present

## 2024-02-01 DIAGNOSIS — D630 Anemia in neoplastic disease: Secondary | ICD-10-CM | POA: Diagnosis present

## 2024-02-01 DIAGNOSIS — N29 Other disorders of kidney and ureter in diseases classified elsewhere: Secondary | ICD-10-CM | POA: Diagnosis present

## 2024-02-01 DIAGNOSIS — Z885 Allergy status to narcotic agent status: Secondary | ICD-10-CM

## 2024-02-01 LAB — CBC WITH DIFFERENTIAL/PLATELET
Abs Immature Granulocytes: 0.03 K/uL (ref 0.00–0.07)
Basophils Absolute: 0 K/uL (ref 0.0–0.1)
Basophils Relative: 0 %
Eosinophils Absolute: 0 K/uL (ref 0.0–0.5)
Eosinophils Relative: 0 %
HCT: 33 % — ABNORMAL LOW (ref 36.0–46.0)
Hemoglobin: 11.1 g/dL — ABNORMAL LOW (ref 12.0–15.0)
Immature Granulocytes: 0 %
Lymphocytes Relative: 26 %
Lymphs Abs: 2.4 K/uL (ref 0.7–4.0)
MCH: 27.9 pg (ref 26.0–34.0)
MCHC: 33.6 g/dL (ref 30.0–36.0)
MCV: 82.9 fL (ref 80.0–100.0)
Monocytes Absolute: 0.7 K/uL (ref 0.1–1.0)
Monocytes Relative: 7 %
Neutro Abs: 5.9 K/uL (ref 1.7–7.7)
Neutrophils Relative %: 67 %
Platelets: 326 K/uL (ref 150–400)
RBC: 3.98 MIL/uL (ref 3.87–5.11)
RDW: 15.4 % (ref 11.5–15.5)
WBC: 9.1 K/uL (ref 4.0–10.5)
nRBC: 0 % (ref 0.0–0.2)

## 2024-02-01 NOTE — ED Triage Notes (Addendum)
 Pt states she has had rt. Lower abdominal pain x 2 weeks.  Today it has become unbearable.   Denies n/v + dysuria Normal Bms Pt is on her menstrual cycle at present time

## 2024-02-02 ENCOUNTER — Inpatient Hospital Stay (HOSPITAL_BASED_OUTPATIENT_CLINIC_OR_DEPARTMENT_OTHER)
Admission: EM | Admit: 2024-02-02 | Discharge: 2024-02-07 | DRG: 375 | Disposition: A | Discharge status: Home / Self Care | Attending: Internal Medicine | Admitting: Internal Medicine

## 2024-02-02 ENCOUNTER — Encounter (HOSPITAL_BASED_OUTPATIENT_CLINIC_OR_DEPARTMENT_OTHER): Payer: Self-pay | Admitting: Internal Medicine

## 2024-02-02 ENCOUNTER — Emergency Department (HOSPITAL_BASED_OUTPATIENT_CLINIC_OR_DEPARTMENT_OTHER)

## 2024-02-02 DIAGNOSIS — R948 Abnormal results of function studies of other organs and systems: Secondary | ICD-10-CM | POA: Diagnosis present

## 2024-02-02 DIAGNOSIS — K639 Disease of intestine, unspecified: Secondary | ICD-10-CM

## 2024-02-02 DIAGNOSIS — K633 Ulcer of intestine: Secondary | ICD-10-CM | POA: Diagnosis present

## 2024-02-02 DIAGNOSIS — D630 Anemia in neoplastic disease: Secondary | ICD-10-CM | POA: Diagnosis present

## 2024-02-02 DIAGNOSIS — R109 Unspecified abdominal pain: Secondary | ICD-10-CM | POA: Diagnosis not present

## 2024-02-02 DIAGNOSIS — I10 Essential (primary) hypertension: Secondary | ICD-10-CM | POA: Diagnosis not present

## 2024-02-02 DIAGNOSIS — R591 Generalized enlarged lymph nodes: Secondary | ICD-10-CM

## 2024-02-02 DIAGNOSIS — C19 Malignant neoplasm of rectosigmoid junction: Secondary | ICD-10-CM | POA: Insufficient documentation

## 2024-02-02 DIAGNOSIS — N941 Unspecified dyspareunia: Secondary | ICD-10-CM | POA: Diagnosis present

## 2024-02-02 DIAGNOSIS — K921 Melena: Secondary | ICD-10-CM | POA: Diagnosis present

## 2024-02-02 DIAGNOSIS — Z87891 Personal history of nicotine dependence: Secondary | ICD-10-CM | POA: Diagnosis not present

## 2024-02-02 DIAGNOSIS — K5669 Other partial intestinal obstruction: Secondary | ICD-10-CM | POA: Diagnosis present

## 2024-02-02 DIAGNOSIS — L0291 Cutaneous abscess, unspecified: Secondary | ICD-10-CM | POA: Diagnosis present

## 2024-02-02 DIAGNOSIS — Z885 Allergy status to narcotic agent status: Secondary | ICD-10-CM | POA: Diagnosis not present

## 2024-02-02 DIAGNOSIS — K56609 Unspecified intestinal obstruction, unspecified as to partial versus complete obstruction: Principal | ICD-10-CM

## 2024-02-02 DIAGNOSIS — R102 Pelvic and perineal pain: Secondary | ICD-10-CM | POA: Diagnosis present

## 2024-02-02 DIAGNOSIS — N29 Other disorders of kidney and ureter in diseases classified elsewhere: Secondary | ICD-10-CM | POA: Diagnosis present

## 2024-02-02 DIAGNOSIS — D649 Anemia, unspecified: Secondary | ICD-10-CM | POA: Diagnosis present

## 2024-02-02 DIAGNOSIS — N200 Calculus of kidney: Secondary | ICD-10-CM | POA: Diagnosis present

## 2024-02-02 DIAGNOSIS — E876 Hypokalemia: Secondary | ICD-10-CM | POA: Diagnosis present

## 2024-02-02 LAB — URINALYSIS, ROUTINE W REFLEX MICROSCOPIC
Glucose, UA: 100 mg/dL — AB
Ketones, ur: 40 mg/dL — AB
Leukocytes,Ua: NEGATIVE
Nitrite: NEGATIVE
Protein, ur: 30 mg/dL — AB
Specific Gravity, Urine: 1.02 (ref 1.005–1.030)
pH: 7.5 (ref 5.0–8.0)

## 2024-02-02 LAB — COMPREHENSIVE METABOLIC PANEL WITH GFR
ALT: <5 U/L (ref 0–44)
AST: 12 U/L — ABNORMAL LOW (ref 15–41)
Albumin: 4.1 g/dL (ref 3.5–5.0)
Alkaline Phosphatase: 91 U/L (ref 38–126)
Anion gap: 12 (ref 5–15)
BUN: 9 mg/dL (ref 6–20)
CO2: 23 mmol/L (ref 22–32)
Calcium: 9.1 mg/dL (ref 8.9–10.3)
Chloride: 107 mmol/L (ref 98–111)
Creatinine, Ser: 0.67 mg/dL (ref 0.44–1.00)
GFR, Estimated: >60 mL/min (ref >60)
Glucose, Bld: 92 mg/dL (ref 70–99)
Potassium: 3.4 mmol/L — ABNORMAL LOW (ref 3.5–5.1)
Sodium: 142 mmol/L (ref 135–145)
Total Bilirubin: 0.5 mg/dL (ref 0.0–1.2)
Total Protein: 7.4 g/dL (ref 6.5–8.1)

## 2024-02-02 LAB — URINALYSIS, MICROSCOPIC (REFLEX): WBC, UA: NONE SEEN WBC/hpf (ref 0–5)

## 2024-02-02 LAB — PREGNANCY, URINE: Preg Test, Ur: NEGATIVE

## 2024-02-02 MED ORDER — SODIUM CHLORIDE 0.9 % IV SOLN: INTRAVENOUS | Status: AC

## 2024-02-02 MED ORDER — PEG 3350-KCL-NA BICARB-NACL 420 G PO SOLR
2000.0000 mL | Freq: Once | ORAL | Status: AC
  Administered 2024-02-02: 2000 mL via ORAL

## 2024-02-02 MED ORDER — FENTANYL CITRATE PF 50 MCG/ML IJ SOSY
50.0000 ug | PREFILLED_SYRINGE | Freq: Once | INTRAMUSCULAR | Status: AC
  Administered 2024-02-02: 50 ug via INTRAVENOUS
  Filled 2024-02-02: qty 1

## 2024-02-02 MED ORDER — POLYETHYLENE GLYCOL 3350 17 G PO PACK
17.0000 g | PACK | Freq: Every day | ORAL | Status: DC
  Filled 2024-02-02: qty 1

## 2024-02-02 MED ORDER — KETOROLAC TROMETHAMINE 30 MG/ML IJ SOLN
30.0000 mg | Freq: Once | INTRAMUSCULAR | Status: AC
  Administered 2024-02-02: 30 mg via INTRAVENOUS

## 2024-02-02 MED ORDER — PIPERACILLIN-TAZOBACTAM 3.375 G IVPB 30 MIN
3.3750 g | Freq: Once | INTRAVENOUS | Status: AC
  Administered 2024-02-02: 3.375 g via INTRAVENOUS
  Filled 2024-02-02: qty 50

## 2024-02-02 MED ORDER — HYDROMORPHONE HCL 1 MG/ML IJ SOLN
0.5000 mg | INTRAMUSCULAR | Status: DC | PRN
  Administered 2024-02-02 – 2024-02-06 (×16): 0.5 mg via INTRAVENOUS
  Filled 2024-02-02 (×16): qty 0.5

## 2024-02-02 MED ORDER — ENOXAPARIN SODIUM 30 MG/0.3ML IJ SOSY
30.0000 mg | PREFILLED_SYRINGE | Freq: Every day | INTRAMUSCULAR | Status: DC
  Administered 2024-02-02 – 2024-02-05 (×4): 30 mg via SUBCUTANEOUS
  Filled 2024-02-02 (×5): qty 0.3

## 2024-02-02 MED ORDER — ONDANSETRON HCL 4 MG/2ML IJ SOLN
4.0000 mg | Freq: Four times a day (QID) | INTRAMUSCULAR | Status: DC | PRN
  Administered 2024-02-03 – 2024-02-04 (×2): 4 mg via INTRAVENOUS
  Filled 2024-02-02 (×3): qty 2

## 2024-02-02 MED ORDER — PEG 3350-KCL-NA BICARB-NACL 420 G PO SOLR: 2000.0000 mL | Freq: Once | ORAL | Status: DC

## 2024-02-02 MED ORDER — OXYCODONE HCL 5 MG PO TABS
5.0000 mg | ORAL_TABLET | Freq: Four times a day (QID) | ORAL | Status: DC | PRN
  Administered 2024-02-04 – 2024-02-07 (×6): 5 mg via ORAL
  Filled 2024-02-02 (×7): qty 1

## 2024-02-02 MED ORDER — SODIUM CHLORIDE 0.9 % IV SOLN: INTRAVENOUS | Status: DC

## 2024-02-02 MED ORDER — PIPERACILLIN-TAZOBACTAM 3.375 G IVPB
3.3750 g | Freq: Three times a day (TID) | INTRAVENOUS | Status: DC
  Administered 2024-02-02 – 2024-02-06 (×12): 3.375 g via INTRAVENOUS
  Filled 2024-02-02 (×12): qty 50

## 2024-02-02 MED ORDER — POTASSIUM CHLORIDE CRYS ER 20 MEQ PO TBCR
40.0000 meq | EXTENDED_RELEASE_TABLET | Freq: Once | ORAL | Status: AC
  Administered 2024-02-02: 40 meq via ORAL
  Filled 2024-02-02: qty 2

## 2024-02-02 NOTE — ED Notes (Signed)
 Patient transported to CT

## 2024-02-02 NOTE — ED Provider Notes (Signed)
 [place] [facility] Provider Note   CSN: [id number] Arrival date & time: 02/01/24  2328     Patient presents with: Abdominal Pain   Amy Scott is a 33 y.o. female.   The history is provided by the patient.  Abdominal Pain Pain location:  R flank and suprapubic Pain quality: cramping   Pain radiates to:  Does not radiate Pain severity:  Severe Onset quality:  Gradual Duration:  2 weeks Progression:  Waxing and waning Chronicity:  New Context: not trauma   Relieved by:  Nothing Worsened by:  Nothing Ineffective treatments:  None tried Associated symptoms: dysuria and vaginal bleeding   Associated symptoms: no anorexia, no fever, no shortness of breath, no sore throat and no vomiting   Risk factors: not pregnant and no recent hospitalization       Past Medical History:  Diagnosis Date   Headache(784.0)    No pertinent past medical history      Prior to Admission medications   Not on File    Allergies: Morphine and codeine    Review of Systems  Constitutional:  Negative for fever.  HENT:  Negative for sore throat.   Respiratory:  Negative for shortness of breath.   Gastrointestinal:  Positive for abdominal pain. Negative for anorexia and vomiting.  Genitourinary:  Positive for dysuria, flank pain and vaginal bleeding.  All other systems reviewed and are negative.   Updated Vital Signs BP 121/76 (BP Location: Left Arm)   Pulse 67   Temp 98.1 F (36.7 C) (Oral)   Resp 14   Ht 5' 4 (1.626 m)   Wt 45.4 kg   LMP 01/27/2024 (Exact Date)   SpO2 100%   BMI 17.16 kg/m   Physical Exam Vitals and nursing note reviewed.  Constitutional:      General: She is not in acute distress.    Appearance: Normal appearance. She is well-developed.  HENT:     Head: Normocephalic and atraumatic.     Nose: Nose normal.  Eyes:     Pupils: Pupils are equal, round, and reactive to light.  Cardiovascular:     Rate and Rhythm:  Normal rate and regular rhythm.     Pulses: Normal pulses.     Heart sounds: Normal heart sounds.  Pulmonary:     Effort: Pulmonary effort is normal. No respiratory distress.     Breath sounds: Normal breath sounds.  Abdominal:     General: Bowel sounds are normal. There is no distension.     Palpations: Abdomen is soft.     Tenderness: There is no abdominal tenderness. There is no guarding or rebound.  Musculoskeletal:        General: Normal range of motion.     Cervical back: Neck supple.  Skin:    General: Skin is warm and dry.     Capillary Refill: Capillary refill takes less than 2 seconds.     Findings: No erythema or rash.  Neurological:     General: No focal deficit present.     Mental Status: She is alert and oriented to person, place, and time.     Deep Tendon Reflexes: Reflexes normal.  Psychiatric:        Mood and Affect: Mood normal.     (all labs ordered are listed, but only abnormal results are displayed) Results for orders placed or performed during the hospital encounter of 02/02/24  Urinalysis, Routine w reflex microscopic -Urine, Clean Catch  Collection Time: 02/01/24 11:36 PM  Result Value Ref Range   Color, Urine AMBER (A) YELLOW   APPearance HAZY (A) CLEAR   Specific Gravity, Urine 1.020 1.005 - 1.030   pH 7.5 5.0 - 8.0   Glucose, UA 100 (A) NEGATIVE mg/dL   Hgb urine dipstick MODERATE (A) NEGATIVE   Bilirubin Urine MODERATE (A) NEGATIVE   Ketones, ur 40 (A) NEGATIVE mg/dL   Protein, ur 30 (A) NEGATIVE mg/dL   Nitrite NEGATIVE NEGATIVE   Leukocytes,Ua NEGATIVE NEGATIVE  Urinalysis, Microscopic (reflex)   Collection Time: 02/01/24 11:36 PM  Result Value Ref Range   RBC / HPF 11-20 0 - 5 RBC/hpf   WBC, UA NONE SEEN 0 - 5 WBC/hpf   Bacteria, UA RARE (A) NONE SEEN   Squamous Epithelial / HPF 0-5 0 - 5 /HPF   Mucus PRESENT   CBC with Differential   Collection Time: 02/01/24 11:38 PM  Result Value Ref Range   WBC 9.1 4.0 - 10.5 K/uL   RBC 3.98  3.87 - 5.11 MIL/uL   Hemoglobin 11.1 (L) 12.0 - 15.0 g/dL   HCT 66.9 (L) 63.9 - 53.9 %   MCV 82.9 80.0 - 100.0 fL   MCH 27.9 26.0 - 34.0 pg   MCHC 33.6 30.0 - 36.0 g/dL   RDW 84.5 88.4 - 84.4 %   Platelets 326 150 - 400 K/uL   nRBC 0.0 0.0 - 0.2 %   Neutrophils Relative % 67 %   Neutro Abs 5.9 1.7 - 7.7 K/uL   Lymphocytes Relative 26 %   Lymphs Abs 2.4 0.7 - 4.0 K/uL   Monocytes Relative 7 %   Monocytes Absolute 0.7 0.1 - 1.0 K/uL   Eosinophils Relative 0 %   Eosinophils Absolute 0.0 0.0 - 0.5 K/uL   Basophils Relative 0 %   Basophils Absolute 0.0 0.0 - 0.1 K/uL   Immature Granulocytes 0 %   Abs Immature Granulocytes 0.03 0.00 - 0.07 K/uL  Comprehensive metabolic panel   Collection Time: 02/01/24 11:38 PM  Result Value Ref Range   Sodium 142 135 - 145 mmol/L   Potassium 3.4 (L) 3.5 - 5.1 mmol/L   Chloride 107 98 - 111 mmol/L   CO2 23 22 - 32 mmol/L   Glucose, Bld 92 70 - 99 mg/dL   BUN 9 6 - 20 mg/dL   Creatinine, Ser 9.32 0.44 - 1.00 mg/dL   Calcium 9.1 8.9 - 89.6 mg/dL   Total Protein 7.4 6.5 - 8.1 g/dL   Albumin 4.1 3.5 - 5.0 g/dL   AST 12 (L) 15 - 41 U/L   ALT <5 0 - 44 U/L   Alkaline Phosphatase 91 38 - 126 U/L   Total Bilirubin 0.5 0.0 - 1.2 mg/dL   GFR, Estimated >39 >39 mL/min   Anion gap 12 5 - 15  Pregnancy, urine   Collection Time: 02/01/24 11:38 PM  Result Value Ref Range   Preg Test, Ur NEGATIVE NEGATIVE   CT Renal Stone Study Result Date: 02/02/2024 CLINICAL DATA:  Right lower quadrant abdominal pain, history of appendectomy EXAM: CT ABDOMEN AND PELVIS WITHOUT CONTRAST TECHNIQUE: Multidetector CT imaging of the abdomen and pelvis was performed following the standard protocol without IV contrast. RADIATION DOSE REDUCTION: This exam was performed according to the departmental dose-optimization program which includes automated exposure control, adjustment of the mA and/or kV according to patient size and/or use of iterative reconstruction technique. COMPARISON:   None Available. FINDINGS: Lower chest: No acute  abnormality. Hepatobiliary: No focal liver abnormality is seen. No gallstones, gallbladder wall thickening, or biliary dilatation. Pancreas: Unremarkable Spleen: Unremarkable Adrenals/Urinary Tract: The adrenal glands are unremarkable. Fine calcifications are seen within the kidneys bilaterally in keeping medullary nephrocalcinosis. No urinary renal or ureteral calculi are identified. No hydronephrosis. No perinephric inflammatory stranding or fluid collections are seen. Bladder is unremarkable Stomach/Bowel: There is circumferential bowel wall thickening and extensive perirectal inflammatory stranding involving the mid and high rectum with extensive inflammatory changes extending into the presacral space, best appreciated on image # 57/2 and 70/7. There is suggestion of nodular soft tissue within the colorectal mesentery at this level (102/5) which may reflect inflammatory soft tissue/phlegmon. This may be infectious or inflammatory in nature as can be seen with inflammatory bowel disease. However, an infiltrative mass with transmural extension could appear similarly. Extensive perirectal adenopathy is present, possibly reactive though infiltrative disease could appear similarly. There is a resultant partial large bowel obstruction with moderate volume stool seen throughout the more proximal colon. The stomach, small bowel, and large bowel are otherwise unremarkable. Appendix has been removed. No free intraperitoneal gas or fluid Vascular/Lymphatic: The left gonadal vein appears dilated but is not well assessed on this noncontrast examination. The abdominal vasculature is otherwise unremarkable. No additional pathologic adenopathy identified within the abdomen and pelvis. Reproductive: Uterus and bilateral adnexa are unremarkable. Other: No abdominal wall hernia Musculoskeletal: No acute or significant osseous findings. IMPRESSION: 1. Circumferential bowel wall  thickening and extensive perirectal inflammatory stranding involving the mid and high rectum with extensive inflammatory changes extending into the presacral space. There is suggestion of nodular soft tissue within the colorectal mesentery at this level which may reflect inflammatory soft tissue/phlegmon. This may be infectious or inflammatory in nature as can be seen with inflammatory bowel disease. However, an infiltrative mass with transmural extension could appear similarly. Correlation with endoscopy is recommended. 2. Extensive perirectal adenopathy, possibly reactive though infiltrative disease could appear similarly. 3. Resultant partial large bowel obstruction with moderate volume stool seen throughout the more proximal colon. 4. Medullary nephrocalcinosis. Differential considerations include systemic hypercalcemia, renal tubular acidosis, or hyperparathyroidism Electronically Signed   By: Dorethia Molt M.D.   On: 02/02/2024 02:49     Radiology: CT Renal Stone Study Result Date: 02/02/2024 CLINICAL DATA:  Right lower quadrant abdominal pain, history of appendectomy EXAM: CT ABDOMEN AND PELVIS WITHOUT CONTRAST TECHNIQUE: Multidetector CT imaging of the abdomen and pelvis was performed following the standard protocol without IV contrast. RADIATION DOSE REDUCTION: This exam was performed according to the departmental dose-optimization program which includes automated exposure control, adjustment of the mA and/or kV according to patient size and/or use of iterative reconstruction technique. COMPARISON:  None Available. FINDINGS: Lower chest: No acute abnormality. Hepatobiliary: No focal liver abnormality is seen. No gallstones, gallbladder wall thickening, or biliary dilatation. Pancreas: Unremarkable Spleen: Unremarkable Adrenals/Urinary Tract: The adrenal glands are unremarkable. Fine calcifications are seen within the kidneys bilaterally in keeping medullary nephrocalcinosis. No urinary renal or  ureteral calculi are identified. No hydronephrosis. No perinephric inflammatory stranding or fluid collections are seen. Bladder is unremarkable Stomach/Bowel: There is circumferential bowel wall thickening and extensive perirectal inflammatory stranding involving the mid and high rectum with extensive inflammatory changes extending into the presacral space, best appreciated on image # 57/2 and 70/7. There is suggestion of nodular soft tissue within the colorectal mesentery at this level (102/5) which may reflect inflammatory soft tissue/phlegmon. This may be infectious or inflammatory in nature as can be seen with  inflammatory bowel disease. However, an infiltrative mass with transmural extension could appear similarly. Extensive perirectal adenopathy is present, possibly reactive though infiltrative disease could appear similarly. There is a resultant partial large bowel obstruction with moderate volume stool seen throughout the more proximal colon. The stomach, small bowel, and large bowel are otherwise unremarkable. Appendix has been removed. No free intraperitoneal gas or fluid Vascular/Lymphatic: The left gonadal vein appears dilated but is not well assessed on this noncontrast examination. The abdominal vasculature is otherwise unremarkable. No additional pathologic adenopathy identified within the abdomen and pelvis. Reproductive: Uterus and bilateral adnexa are unremarkable. Other: No abdominal wall hernia Musculoskeletal: No acute or significant osseous findings. IMPRESSION: 1. Circumferential bowel wall thickening and extensive perirectal inflammatory stranding involving the mid and high rectum with extensive inflammatory changes extending into the presacral space. There is suggestion of nodular soft tissue within the colorectal mesentery at this level which may reflect inflammatory soft tissue/phlegmon. This may be infectious or inflammatory in nature as can be seen with inflammatory bowel disease.  However, an infiltrative mass with transmural extension could appear similarly. Correlation with endoscopy is recommended. 2. Extensive perirectal adenopathy, possibly reactive though infiltrative disease could appear similarly. 3. Resultant partial large bowel obstruction with moderate volume stool seen throughout the more proximal colon. 4. Medullary nephrocalcinosis. Differential considerations include systemic hypercalcemia, renal tubular acidosis, or hyperparathyroidism Electronically Signed   By: Dorethia Molt M.D.   On: 02/02/2024 02:49     Procedures   Medications Ordered in the ED  0.9 %  sodium chloride  infusion (has no administration in time range)  ketorolac  (TORADOL ) 30 MG/ML injection 30 mg (30 mg Intravenous Given 02/02/24 0148)                                    Medical Decision Making Amount and/or Complexity of Data Reviewed External Data Reviewed: notes.    Details: Previous notes reviewed  Labs: ordered.    Details: Normal white count 142, potassium slight low 3.4 normal creatinine 0.67, normal white count 9.1, hemoglobin slight low 11.1, urine is positive for blood negative for UTI Radiology: ordered and independent interpretation performed.    Details: No SBO by me, constipation  Discussion of management or test interpretation with external provider(s): 3:34 AM case d/w Dr. Nalani, no NGT, surgery will consult.  Admit to medicine  Secure chat sent to Dr. Lawernce of GI  Risk Prescription drug management. Decision regarding hospitalization.     Final diagnoses:  None   The patient appears reasonably stabilized for admission considering the current resources, flow, and capabilities available in the ED at this time, and I doubt any other [facility] requiring further screening and/or treatment in the ED prior to admission.  ED Discharge Orders     None          Elsa Ploch, MD 02/02/24 [phone]

## 2024-02-02 NOTE — Consult Note (Signed)
 [name] Mar 18, 1991  [id number].    Requesting MD: Dr. Ivonne Darnel Chief Complaint/Reason for Consult: Abdominal pain  HPI: Amy Scott is a 33 y.o. female who presented to [facility] ED with abdominal pain. She reports that over the last 4 weeks she has had lower pelvic pain radiating into her right groin that is constant with associated episodes of hematochezia. Her pain is worse with po intake and usually brings on episodes of bm's. Notes her BM's are smaller but are still formed and she notes blood in her stool as well as when she wipes. She reports poor po intake secondary to worsening pain after eating and thinks she has lost weight since onset as her waist belt. No fever, n/v. She denies personal or family history of IBD or colon cancer. She has never had a colonoscopy. No rectal trauma. Denies tobacco use. Reports occasional alcohol use. She reports marijuana use but otherwise no drug use. She is not on blood thinners. She has a hx of prior appendectomy.   ROS: ROS As above, see hpi  History reviewed. No pertinent family history.  Past Medical History:  Diagnosis Date   Headache(784.0)    No pertinent past medical history     Past Surgical History:  Procedure Laterality Date   APPENDECTOMY     MOUTH SURGERY      Social History:  reports that she has quit smoking. Her smoking use included cigarettes. She has never used smokeless tobacco. She reports that she does not drink alcohol and does not use drugs.  Allergies:  Allergies  Allergen Reactions   Morphine And Codeine Itching, Swelling, Hives and Shortness Of Breath    No medications prior to admission.     Physical Exam: Blood pressure (!) 129/91, pulse 65, temperature 98.1 F (36.7 C), resp. rate 19, height 5' 4 (1.626 m), weight 45.4 kg, last menstrual period 01/27/2024, SpO2 100%. General: pleasant, WD/WN female who is laying in bed in NAD HEENT: head is normocephalic, atraumatic.  Sclera are  non-icteric.  Heart: regular, rate, and rhythm.   Lungs: Respiratory effort nonlabored Abd:  Soft, ND, suprapubic ttp without rigidity or guarding. No masses, hernias, or organomegaly MS: no BUE edema Skin: warm and dry  Psych: A&Ox4 with an appropriate affect Neuro: normal speech, thought process intact, moves all extremities, gait not assessed  Results for orders placed or performed during the hospital encounter of 02/02/24 (from the past 48 hours)  Urinalysis, Routine w reflex microscopic -Urine, Clean Catch     Status: Abnormal   Collection Time: 02/01/24 11:36 PM  Result Value Ref Range   Color, Urine AMBER (A) YELLOW    Comment: BIOCHEMICALS MAY BE AFFECTED BY COLOR   APPearance HAZY (A) CLEAR   Specific Gravity, Urine 1.020 1.005 - 1.030   pH 7.5 5.0 - 8.0   Glucose, UA 100 (A) NEGATIVE mg/dL   Hgb urine dipstick MODERATE (A) NEGATIVE   Bilirubin Urine MODERATE (A) NEGATIVE   Ketones, ur 40 (A) NEGATIVE mg/dL   Protein, ur 30 (A) NEGATIVE mg/dL   Nitrite NEGATIVE NEGATIVE   Leukocytes,Ua NEGATIVE NEGATIVE    Comment: Performed at [facility], [address] [place], [place] [address]  Urinalysis, Microscopic (reflex)     Status: Abnormal   Collection Time: 02/01/24 11:36 PM  Result Value Ref Range   RBC / HPF 11-20 0 - 5 RBC/hpf   WBC, UA NONE SEEN 0 - 5 WBC/hpf   Bacteria,  UA RARE (A) NONE SEEN   Squamous Epithelial / HPF 0-5 0 - 5 /HPF   Mucus PRESENT     Comment: Performed at [facility], [address] [place], [place] [address]  CBC with Differential     Status: Abnormal   Collection Time: 02/01/24 11:38 PM  Result Value Ref Range   WBC 9.1 4.0 - 10.5 K/uL   RBC 3.98 3.87 - 5.11 MIL/uL   Hemoglobin 11.1 (L) 12.0 - 15.0 g/dL   HCT 66.9 (L) 63.9 - 53.9 %   MCV 82.9 80.0 - 100.0 fL   MCH 27.9 26.0 - 34.0 pg   MCHC 33.6 30.0 - 36.0 g/dL   RDW 84.5 88.4 - 84.4 %   Platelets 326 150 - 400 K/uL   nRBC 0.0 0.0 - 0.2 %   Neutrophils  Relative % 67 %   Neutro Abs 5.9 1.7 - 7.7 K/uL   Lymphocytes Relative 26 %   Lymphs Abs 2.4 0.7 - 4.0 K/uL   Monocytes Relative 7 %   Monocytes Absolute 0.7 0.1 - 1.0 K/uL   Eosinophils Relative 0 %   Eosinophils Absolute 0.0 0.0 - 0.5 K/uL   Basophils Relative 0 %   Basophils Absolute 0.0 0.0 - 0.1 K/uL   Immature Granulocytes 0 %   Abs Immature Granulocytes 0.03 0.00 - 0.07 K/uL    Comment: Performed at [facility], [address] [place], [place] [address]  Comprehensive metabolic panel     Status: Abnormal   Collection Time: 02/01/24 11:38 PM  Result Value Ref Range   Sodium 142 135 - 145 mmol/L   Potassium 3.4 (L) 3.5 - 5.1 mmol/L   Chloride 107 98 - 111 mmol/L   CO2 23 22 - 32 mmol/L   Glucose, Bld 92 70 - 99 mg/dL    Comment: Glucose reference range applies only to samples taken after fasting for at least 8 hours.   BUN 9 6 - 20 mg/dL   Creatinine, Ser 9.32 0.44 - 1.00 mg/dL   Calcium 9.1 8.9 - 89.6 mg/dL   Total Protein 7.4 6.5 - 8.1 g/dL   Albumin 4.1 3.5 - 5.0 g/dL   AST 12 (L) 15 - 41 U/L   ALT <5 0 - 44 U/L   Alkaline Phosphatase 91 38 - 126 U/L   Total Bilirubin 0.5 0.0 - 1.2 mg/dL   GFR, Estimated >39 >39 mL/min    Comment: (NOTE) Calculated using the CKD-EPI Creatinine Equation (2021)    Anion gap 12 5 - 15    Comment: Performed at [facility], [address] [place], [place] [address]  Pregnancy, urine     Status: None   Collection Time: 02/01/24 11:38 PM  Result Value Ref Range   Preg Test, Ur NEGATIVE NEGATIVE    Comment:        THE SENSITIVITY OF THIS METHODOLOGY IS >20 mIU/mL. Performed at [facility], [address] [place], [place] [address]    CT Renal [place]a Study Result Date: 02/02/2024 CLINICAL DATA:  Right lower quadrant abdominal pain, history of appendectomy EXAM: CT ABDOMEN AND PELVIS WITHOUT CONTRAST TECHNIQUE: Multidetector CT imaging of the abdomen and pelvis was performed following the standard  protocol without IV contrast. RADIATION DOSE REDUCTION: This exam was performed according to the departmental dose-optimization program which includes automated exposure control, adjustment of the mA and/or kV according to patient size and/or use of iterative  reconstruction technique. COMPARISON:  None Available. FINDINGS: Lower chest: No acute abnormality. Hepatobiliary: No focal liver abnormality is seen. No gallstones, gallbladder wall thickening, or biliary dilatation. Pancreas: Unremarkable Spleen: Unremarkable Adrenals/Urinary Tract: The adrenal glands are unremarkable. Fine calcifications are seen within the kidneys bilaterally in keeping medullary nephrocalcinosis. No urinary renal or ureteral calculi are identified. No hydronephrosis. No perinephric inflammatory stranding or fluid collections are seen. Bladder is unremarkable Stomach/Bowel: There is circumferential bowel wall thickening and extensive perirectal inflammatory stranding involving the mid and high rectum with extensive inflammatory changes extending into the presacral space, best appreciated on image # 57/2 and 70/7. There is suggestion of nodular soft tissue within the colorectal mesentery at this level (102/5) which may reflect inflammatory soft tissue/phlegmon. This may be infectious or inflammatory in nature as can be seen with inflammatory bowel disease. However, an infiltrative mass with transmural extension could appear similarly. Extensive perirectal adenopathy is present, possibly reactive though infiltrative disease could appear similarly. There is a resultant partial large bowel obstruction with moderate volume stool seen throughout the more proximal colon. The stomach, small bowel, and large bowel are otherwise unremarkable. Appendix has been removed. No free intraperitoneal gas or fluid Vascular/Lymphatic: The left gonadal vein appears dilated but is not well assessed on this noncontrast examination. The abdominal vasculature is  otherwise unremarkable. No additional pathologic adenopathy identified within the abdomen and pelvis. Reproductive: Uterus and bilateral adnexa are unremarkable. Other: No abdominal wall hernia Musculoskeletal: No acute or significant osseous findings. IMPRESSION: 1. Circumferential bowel wall thickening and extensive perirectal inflammatory stranding involving the mid and high rectum with extensive inflammatory changes extending into the presacral space. There is suggestion of nodular soft tissue within the colorectal mesentery at this level which may reflect inflammatory soft tissue/phlegmon. This may be infectious or inflammatory in nature as can be seen with inflammatory bowel disease. However, an infiltrative mass with transmural extension could appear similarly. Correlation with endoscopy is recommended. 2. Extensive perirectal adenopathy, possibly reactive though infiltrative disease could appear similarly. 3. Resultant partial large bowel obstruction with moderate volume stool seen throughout the more proximal colon. 4. Medullary nephrocalcinosis. Differential considerations include systemic hypercalcemia, renal tubular acidosis, or hyperparathyroidism Electronically Signed   By: Dorethia Molt M.D.   On: 02/02/2024 02:49    Anti-infectives (From admission, onward)    Start     Dose/Rate Route Frequency Ordered Stop   02/02/24 1030  piperacillin -tazobactam (ZOSYN ) IVPB 3.375 g        3.375 g 12.5 mL/hr over 240 Minutes Intravenous Every 8 hours 02/02/24 1020     02/02/24 0400  piperacillin -tazobactam (ZOSYN ) IVPB 3.375 g        3.375 g 100 mL/hr over 30 Minutes Intravenous  Once 02/02/24 0346 02/02/24 0455       Assessment/Plan This is a 33 year old female who presented with 4 week history of lower pelvic pain radiating into her right groin with associated poor po intake, weight loss and hematochezia. Her CT scan shows circumferential bowel wall thickening and extensive perirectal  inflammatory stranding involving the mid and high rectum with associated inflammatory changes into the prereview sacral space, possible nodular soft tissue within the colorectal mesentery, perirectal adenopathy and question of resultant partial large bowel obstruction.  She is currently hemodynamically stable without fever, tachycardia or hypotension.  WBC 9.1.  No peritonitis on exam.  Recommendations - No indication for emergency surgery - Agree with IV antibiotics, continue - Recommend n.p.o., bowel rest - Agree with GI consultation.  She would benefit from a flex sig to further evaluate this.  Further recommendations to follow after flex sig.   FEN - NPO, IVF per [facility] VTE - SCDs, okay for chem ppx from a general surgery standpoint ID - Zosyn   I reviewed nursing notes, hospitalist notes, last 24 h vitals and pain scores, last 48 h intake and output, last 24 h labs and trends, and last 24 h imaging results.   Ozell CHRISTELLA Shaper, [facility] 02/02/2024, 4:43 PM Please see Amion for pager number during day hours 7:00am-4:30pm

## 2024-02-02 NOTE — H&P (Signed)
 History and Physical    Randell Detter FMW:[id number] DOB: Oct 16, 1990 DOA: 02/02/2024  PCP: Patient, No Pcp Per   Patient coming from: Home    Chief Complaint: Right flank pain  HPI: Amy Scott is a 33 y.o. female with no significant past medical history presented to med Center at [facility] with complaint of right flank pain for 2 weeks.  Pain was progressive in nature and cramping in quality.  Pain was mainly on the right flank and on the suprapubic region.  Pain worsened during defecation.  She also reported seeing fresh blood with some stool.  She was having small bowel movements. Patient does not have any past medical history, does not take any medications.  No report of nausea or vomiting or fever at home.  On presentation, she was hemodynamically stable.  Lab work showed potassium 3.4, WBC count of 9.1, hemoglobin of 11.1.  UA not suspicious for UTI. Patient seen and examined at bedside this afternoon at the floor.  During my evaluation, she was overall comfortable.  She denied any chest pain, shortness of breath, cough, diarrhea, gross hematochezia or melena.  She reported seeing some spots of fresh blood in the stool. Patient lives with her children.  Does not have a PCP.  No history of smoking or heavy alcohol use   ED Course: CT renal stone study showed circumferential bowel wall thickening and extensive perirectal inflammatory stranding involving the mid and high rectum with extensive inflammatory changes extending into the presacral space.  Findings suspicious for inflammatory bowel disease.  Also showed  partial large bowel obstruction with moderate volume stool seen throughout the more proximal colon.  General surgery, GI consulted.  Review of Systems: As per HPI otherwise 10 point review of systems negative.    Past Medical History:  Diagnosis Date   Headache(784.0)    No pertinent past medical history     Past Surgical History:  Procedure Laterality Date    APPENDECTOMY     MOUTH SURGERY       reports that she has quit smoking. Her smoking use included cigarettes. She has never used smokeless tobacco. She reports that she does not drink alcohol and does not use drugs.  Allergies  Allergen Reactions   Morphine And Codeine Itching, Swelling, Hives and Shortness Of Breath    History reviewed. No pertinent family history.   Prior to Admission medications   Not on File    Physical Exam: Vitals:   02/02/24 1130 02/02/24 1200 02/02/24 1215 02/02/24 1300  BP: 137/76 122/79  129/79  Pulse: (!) 59 (!) 53 (!) 59 (!) 49  Resp: 16 16 12 14   Temp:    97.9 F (36.6 C)  TempSrc:    Oral  SpO2: 100% 100% 100% 100%  Weight:      Height:        Constitutional: NAD, calm, comfortable Vitals:   02/02/24 1130 02/02/24 1200 02/02/24 1215 02/02/24 1300  BP: 137/76 122/79  129/79  Pulse: (!) 59 (!) 53 (!) 59 (!) 49  Resp: 16 16 12 14   Temp:    97.9 F (36.6 C)  TempSrc:    Oral  SpO2: 100% 100% 100% 100%  Weight:      Height:       Eyes: PERRL, lids and conjunctivae normal ENMT: Mucous membranes are moist.  Neck: normal, supple, no masses, no thyromegaly Respiratory: clear to auscultation bilaterally, no wheezing, no crackles. Normal respiratory effort. No accessory muscle use.  Cardiovascular: Regular  rate and rhythm, no murmurs / rubs / gallops. No extremity edema.  Abdomen: Mild tenderness in the suprapubic region, no masses palpated. No hepatosplenomegaly. Bowel sounds positive.  Abdomen is not distended, has good bowel sounds Musculoskeletal: no clubbing / cyanosis. No joint deformity upper and lower extremities.  Skin: no rashes, lesions, ulcers. No induration Neurologic: CN 2-12 grossly intact.  Strength 5/5 in all 4.  Psychiatric: Normal judgment and insight. Alert and oriented x 3. Normal mood.   Foley Catheter:None  Labs on Admission: I have personally reviewed following labs and imaging studies  CBC: Recent Labs  Lab  02/01/24 2338  WBC 9.1  NEUTROABS 5.9  HGB 11.1*  HCT 33.0*  MCV 82.9  PLT 326   Basic Metabolic Panel: Recent Labs  Lab 02/01/24 2338  NA 142  K 3.4*  CL 107  CO2 23  GLUCOSE 92  BUN 9  CREATININE 0.67  CALCIUM 9.1   GFR: Estimated Creatinine Clearance: 71.7 mL/min (by C-G formula based on SCr of 0.67 mg/dL). Liver Function Tests: Recent Labs  Lab 02/01/24 2338  AST 12*  ALT <5  ALKPHOS 91  BILITOT 0.5  PROT 7.4  ALBUMIN 4.1   No results for input(s): LIPASE, AMYLASE in the last 168 hours. No results for input(s): AMMONIA in the last 168 hours. Coagulation Profile: No results for input(s): INR, PROTIME in the last 168 hours. Cardiac Enzymes: No results for input(s): CKTOTAL, CKMB, CKMBINDEX, TROPONINI in the last 168 hours. BNP (last 3 results) No results for input(s): PROBNP in the last 8760 hours. HbA1C: No results for input(s): HGBA1C in the last 72 hours. CBG: No results for input(s): GLUCAP in the last 168 hours. Lipid Profile: No results for input(s): CHOL, HDL, LDLCALC, TRIG, CHOLHDL, LDLDIRECT in the last 72 hours. Thyroid Function Tests: No results for input(s): TSH, T4TOTAL, FREET4, T3FREE, THYROIDAB in the last 72 hours. Anemia Panel: No results for input(s): VITAMINB12, FOLATE, FERRITIN, TIBC, IRON , RETICCTPCT in the last 72 hours. Urine analysis:    Component Value Date/Time   COLORURINE AMBER (A) 02/01/2024 2336   APPEARANCEUR HAZY (A) 02/01/2024 2336   LABSPEC 1.020 02/01/2024 2336   PHURINE 7.5 02/01/2024 2336   GLUCOSEU 100 (A) 02/01/2024 2336   HGBUR MODERATE (A) 02/01/2024 2336   BILIRUBINUR MODERATE (A) 02/01/2024 2336   KETONESUR 40 (A) 02/01/2024 2336   PROTEINUR 30 (A) 02/01/2024 2336   UROBILINOGEN 1.0 04/10/2013 2052   NITRITE NEGATIVE 02/01/2024 2336   LEUKOCYTESUR NEGATIVE 02/01/2024 2336    Radiological Exams on Admission: CT Renal Stone Study Result Date:  02/02/2024 CLINICAL DATA:  Right lower quadrant abdominal pain, history of appendectomy EXAM: CT ABDOMEN AND PELVIS WITHOUT CONTRAST TECHNIQUE: Multidetector CT imaging of the abdomen and pelvis was performed following the standard protocol without IV contrast. RADIATION DOSE REDUCTION: This exam was performed according to the departmental dose-optimization program which includes automated exposure control, adjustment of the mA and/or kV according to patient size and/or use of iterative reconstruction technique. COMPARISON:  None Available. FINDINGS: Lower chest: No acute abnormality. Hepatobiliary: No focal liver abnormality is seen. No gallstones, gallbladder wall thickening, or biliary dilatation. Pancreas: Unremarkable Spleen: Unremarkable Adrenals/Urinary Tract: The adrenal glands are unremarkable. Fine calcifications are seen within the kidneys bilaterally in keeping medullary nephrocalcinosis. No urinary renal or ureteral calculi are identified. No hydronephrosis. No perinephric inflammatory stranding or fluid collections are seen. Bladder is unremarkable Stomach/Bowel: There is circumferential bowel wall thickening and extensive perirectal inflammatory stranding involving the mid and  high rectum with extensive inflammatory changes extending into the presacral space, best appreciated on image # 57/2 and 70/7. There is suggestion of nodular soft tissue within the colorectal mesentery at this level (102/5) which may reflect inflammatory soft tissue/phlegmon. This may be infectious or inflammatory in nature as can be seen with inflammatory bowel disease. However, an infiltrative mass with transmural extension could appear similarly. Extensive perirectal adenopathy is present, possibly reactive though infiltrative disease could appear similarly. There is a resultant partial large bowel obstruction with moderate volume stool seen throughout the more proximal colon. The stomach, small bowel, and large bowel are  otherwise unremarkable. Appendix has been removed. No free intraperitoneal gas or fluid Vascular/Lymphatic: The left gonadal vein appears dilated but is not well assessed on this noncontrast examination. The abdominal vasculature is otherwise unremarkable. No additional pathologic adenopathy identified within the abdomen and pelvis. Reproductive: Uterus and bilateral adnexa are unremarkable. Other: No abdominal wall hernia Musculoskeletal: No acute or significant osseous findings. IMPRESSION: 1. Circumferential bowel wall thickening and extensive perirectal inflammatory stranding involving the mid and high rectum with extensive inflammatory changes extending into the presacral space. There is suggestion of nodular soft tissue within the colorectal mesentery at this level which may reflect inflammatory soft tissue/phlegmon. This may be infectious or inflammatory in nature as can be seen with inflammatory bowel disease. However, an infiltrative mass with transmural extension could appear similarly. Correlation with endoscopy is recommended. 2. Extensive perirectal adenopathy, possibly reactive though infiltrative disease could appear similarly. 3. Resultant partial large bowel obstruction with moderate volume stool seen throughout the more proximal colon. 4. Medullary nephrocalcinosis. Differential considerations include systemic hypercalcemia, renal tubular acidosis, or hyperparathyroidism Electronically Signed   By: Dorethia Molt M.D.   On: 02/02/2024 02:49     Assessment/Plan Principal Problem:   Abdominal pain Active Problems:   Normocytic anemia   Abdominal pain/suspected inflammatory bowel disease: No significant past medical history.  Report of right flank pain since last 2 weeks, progressively worse.  No report of fever or gross bloody bowel movements.  Had seen some spots of fresh blood mixed with the stool. Imaging showed circumferential bowel wall thickening and extensive  perirectal inflammatory stranding involving the mid and high rectum with extensive inflammatory changes extending into the presacral space. Also showed nodular soft tissue within the colorectal mesentery at this level which may reflect inflammatory soft tissue/phlegmon.  Suspected inflammatory bowel disease.  GI already following.  Currently on Zosyn .  Follow-up GI pathogen panel. Will continue clear liquid diet for now.  Abdomen is not distended, has good bowel sounds.  Partial large bowel obstruction: Partial large bowel obstruction with moderate volume stool seen throughout the more proximal colon.  General surgery already consulted  Hypokalemia: Supplemented with potassium     Severity of Illness: The appropriate patient status for this patient is INPATIENT  DVT prophylaxis: Lovenox  Code Status: Full Family Communication: None at the bedside Consults called: GI, general surgery     Ivonne Mustache MD [facility]  02/02/2024, 2:25 PM

## 2024-02-02 NOTE — Plan of Care (Signed)
  Problem: Education: Goal: Knowledge of General Education information will improve Description: Including pain rating scale, medication(s)/side effects and non-pharmacologic comfort measures Outcome: Progressing   Problem: Clinical Measurements: Goal: Will remain free from infection Outcome: Progressing Goal: Diagnostic test results will improve Outcome: Progressing   Problem: Nutrition: Goal: Adequate nutrition will be maintained Outcome: Progressing   Problem: Elimination: Goal: Will not experience complications related to bowel motility Outcome: Progressing Goal: Will not experience complications related to urinary retention Outcome: Progressing   Problem: Pain Managment: Goal: General experience of comfort will improve and/or be controlled Outcome: Progressing   Problem: Safety: Goal: Ability to remain free from injury will improve Outcome: Progressing

## 2024-02-02 NOTE — Plan of Care (Signed)
 Plan of Care Note for accepted transfer   Patient name: Amy Scott FMW:[id number] DOB: September 03, 1990  Facility requesting transfer: [facility] ED Requesting Provider: Dr. Nettie Facility course: 33 year old female with no documented chronic medical conditions presenting with right flank pain x 2 weeks.  Vital signs stable.  No leukocytosis, hemoglobin 11.1, potassium 3.4.  CT renal stone study showing: IMPRESSION: 1. Circumferential bowel wall thickening and extensive perirectal inflammatory stranding involving the mid and high rectum with extensive inflammatory changes extending into the presacral space. There is suggestion of nodular soft tissue within the colorectal mesentery at this level which may reflect inflammatory soft tissue/phlegmon. This may be infectious or inflammatory in nature as can be seen with inflammatory bowel disease. However, an infiltrative mass with transmural extension could appear similarly. Correlation with endoscopy is recommended. 2. Extensive perirectal adenopathy, possibly reactive though infiltrative disease could appear similarly. 3. Resultant partial large bowel obstruction with moderate volume stool seen throughout the more proximal colon. 4. Medullary nephrocalcinosis. Differential considerations include systemic hypercalcemia, renal tubular acidosis, or Hyperparathyroidism  Patient was given Toradol , Zosyn , and IV fluids.  ED physician spoke to Dr. Belinda on-call for general surgery for [facility] who will consult and requested keeping the patient n.p.o. except ice chips and recommended consulting GI.  ED physician sent a secure chat message to GI Dr. Elicia requesting consultation in the morning.  Plan of care: The patient is accepted for admission to Telemetry unit at [facility].  [facility] will assume care on arrival to accepting facility. Until arrival, care as per EDP. However, [facility] available 24/7 for questions  and assistance.  Check [email or web address] for on-call coverage.  Nursing staff, please call [facility] Admits & Consults System-Wide number under Amion on patient's arrival so appropriate admitting provider can evaluate the pt.

## 2024-02-02 NOTE — Consult Note (Signed)
 UNASSIGNED PATIENT Reason for Consult: Rectal bleeding with change in bowel habits. Referring Physician: [facility].  Amy Scott is an 33 y.o. female.  HPI: Amy Scott is a 33 year old black female with a 2-week history of lower abdominal pain with rectal bleeding and change in bowel habits where she has been having 6-7 small-volume BMs per day. There is bright red blood mixed with the stools. She claims she has never had the symptoms in the past. She denies using any nonsteroidals. There is no known family history of Crohn's or ulcerative colitis or colon cancer. A CT scan of the abdomen pelvis without contrast revealed circumferential bowel wall thickening Stanczyk perirectal inflammatory stranding involving the mid and high rectum with extensive inflammatory changes extending the presacral area along with nodular soft tissue enhancement of the colorectal mesentery which may represent inflammation versus a phlegmon extensive perirectal adenopathy was also noted; a question was raised about a partial large bowel obstruction with these findings but clinically the patient has been passing stool and flatus. Medullary nephrocalcinosis also noted  Past Medical History:  Diagnosis Date   Headache(784.0)    No pertinent past medical history    Past Surgical History:  Procedure Laterality Date   APPENDECTOMY     MOUTH SURGERY     History reviewed. No pertinent family history.  Social History:  reports that she has quit smoking. Her smoking use included cigarettes. She has never used smokeless tobacco. She reports that she does not drink alcohol and does not use drugs.  Allergies:  Allergies  Allergen Reactions   Morphine And Codeine Itching, Swelling, Hives and Shortness Of Breath   Medications: I have reviewed the patient's current medications. Prior to Admission:  No medications prior to admission.   Scheduled:  enoxaparin  (LOVENOX ) injection  30 mg Subcutaneous QHS    polyethylene glycol  17 g Oral Daily   Continuous:  sodium chloride  125 mL/hr at 02/02/24 1427   piperacillin -tazobactam (ZOSYN )  IV 3.375 g (02/02/24 1720)   PRN:HYDROmorphone  (DILAUDID ) injection, ondansetron  (ZOFRAN ) IV, oxyCODONE   Results for orders placed or performed during the hospital encounter of 02/02/24 (from the past 48 hours)  Urinalysis, Routine w reflex microscopic -Urine, Clean Catch     Status: Abnormal   Collection Time: 02/01/24 11:36 PM  Result Value Ref Range   Color, Urine AMBER (A) YELLOW    Comment: BIOCHEMICALS MAY BE AFFECTED BY COLOR   APPearance HAZY (A) CLEAR   Specific Gravity, Urine 1.020 1.005 - 1.030   pH 7.5 5.0 - 8.0   Glucose, UA 100 (A) NEGATIVE mg/dL   Hgb urine dipstick MODERATE (A) NEGATIVE   Bilirubin Urine MODERATE (A) NEGATIVE   Ketones, ur 40 (A) NEGATIVE mg/dL   Protein, ur 30 (A) NEGATIVE mg/dL   Nitrite NEGATIVE NEGATIVE   Leukocytes,Ua NEGATIVE NEGATIVE    Comment: Performed at [facility], [address] [place], [place] [address]  Urinalysis, Microscopic (reflex)     Status: Abnormal   Collection Time: 02/01/24 11:36 PM  Result Value Ref Range   RBC / HPF 11-20 0 - 5 RBC/hpf   WBC, UA NONE SEEN 0 - 5 WBC/hpf   Bacteria, UA RARE (A) NONE SEEN   Squamous Epithelial / HPF 0-5 0 - 5 /HPF   Mucus PRESENT     Comment: Performed at [facility], [address] [place], [place] [address]  CBC with Differential     Status: Abnormal   Collection Time:  02/01/24 11:38 PM  Result Value Ref Range   WBC 9.1 4.0 - 10.5 K/uL   RBC 3.98 3.87 - 5.11 MIL/uL   Hemoglobin 11.1 (L) 12.0 - 15.0 g/dL   HCT 66.9 (L) 63.9 - 53.9 %   MCV 82.9 80.0 - 100.0 fL   MCH 27.9 26.0 - 34.0 pg   MCHC 33.6 30.0 - 36.0 g/dL   RDW 84.5 88.4 - 84.4 %   Platelets 326 150 - 400 K/uL   nRBC 0.0 0.0 - 0.2 %   Neutrophils Relative % 67 %   Neutro Abs 5.9 1.7 - 7.7 K/uL   Lymphocytes Relative 26 %   Lymphs Abs 2.4 0.7 - 4.0 K/uL    Monocytes Relative 7 %   Monocytes Absolute 0.7 0.1 - 1.0 K/uL   Eosinophils Relative 0 %   Eosinophils Absolute 0.0 0.0 - 0.5 K/uL   Basophils Relative 0 %   Basophils Absolute 0.0 0.0 - 0.1 K/uL   Immature Granulocytes 0 %   Abs Immature Granulocytes 0.03 0.00 - 0.07 K/uL    Comment: Performed at [facility], [address] [place], [place] [address]  Comprehensive metabolic panel     Status: Abnormal   Collection Time: 02/01/24 11:38 PM  Result Value Ref Range   Sodium 142 135 - 145 mmol/L   Potassium 3.4 (L) 3.5 - 5.1 mmol/L   Chloride 107 98 - 111 mmol/L   CO2 23 22 - 32 mmol/L   Glucose, Bld 92 70 - 99 mg/dL    Comment: Glucose reference range applies only to samples taken after fasting for at least 8 hours.   BUN 9 6 - 20 mg/dL   Creatinine, Ser 9.32 0.44 - 1.00 mg/dL   Calcium 9.1 8.9 - 89.6 mg/dL   Total Protein 7.4 6.5 - 8.1 g/dL   Albumin 4.1 3.5 - 5.0 g/dL   AST 12 (L) 15 - 41 U/L   ALT <5 0 - 44 U/L   Alkaline Phosphatase 91 38 - 126 U/L   Total Bilirubin 0.5 0.0 - 1.2 mg/dL   GFR, Estimated >39 >39 mL/min    Comment: (NOTE) Calculated using the CKD-EPI Creatinine Equation (2021)    Anion gap 12 5 - 15    Comment: Performed at [facility], [address] [place], [place] [address]  Pregnancy, urine     Status: None   Collection Time: 02/01/24 11:38 PM  Result Value Ref Range   Preg Test, Ur NEGATIVE NEGATIVE    Comment:        THE SENSITIVITY OF THIS METHODOLOGY IS >20 mIU/mL. Performed at [facility], [address] [place], [place] [address]    CT Renal [place] Study Result Date: 02/02/2024 CLINICAL DATA:  Right lower quadrant abdominal pain, history of appendectomy EXAM: CT ABDOMEN AND PELVIS WITHOUT CONTRAST TECHNIQUE: Multidetector CT imaging of the abdomen and pelvis was performed following the standard protocol without IV contrast. RADIATION DOSE REDUCTION: This exam was performed according to the departmental  dose-optimization program which includes automated exposure control, adjustment of the mA and/or kV according to patient size and/or use of iterative reconstruction technique. COMPARISON:  None Available. FINDINGS: Lower chest: No acute abnormality. Hepatobiliary: No focal liver abnormality is seen. No gallstones, gallbladder wall thickening, or biliary dilatation. Pancreas: Unremarkable Spleen: Unremarkable Adrenals/Urinary Tract: The adrenal glands are unremarkable. Fine calcifications are seen within the kidneys bilaterally in keeping medullary nephrocalcinosis. No urinary renal  or ureteral calculi are identified. No hydronephrosis. No perinephric inflammatory stranding or fluid collections are seen. Bladder is unremarkable Stomach/Bowel: There is circumferential bowel wall thickening and extensive perirectal inflammatory stranding involving the mid and high rectum with extensive inflammatory changes extending into the presacral space, best appreciated on image # 57/2 and 70/7. There is suggestion of nodular soft tissue within the colorectal mesentery at this level (102/5) which may reflect inflammatory soft tissue/phlegmon. This may be infectious or inflammatory in nature as can be seen with inflammatory bowel disease. However, an infiltrative mass with transmural extension could appear similarly. Extensive perirectal adenopathy is present, possibly reactive though infiltrative disease could appear similarly. There is a resultant partial large bowel obstruction with moderate volume stool seen throughout the more proximal colon. The stomach, small bowel, and large bowel are otherwise unremarkable. Appendix has been removed. No free intraperitoneal gas or fluid Vascular/Lymphatic: The left gonadal vein appears dilated but is not well assessed on this noncontrast examination. The abdominal vasculature is otherwise unremarkable. No additional pathologic adenopathy identified within the abdomen and pelvis.  Reproductive: Uterus and bilateral adnexa are unremarkable. Other: No abdominal wall hernia Musculoskeletal: No acute or significant osseous findings. IMPRESSION: 1. Circumferential bowel wall thickening and extensive perirectal inflammatory stranding involving the mid and high rectum with extensive inflammatory changes extending into the presacral space. There is suggestion of nodular soft tissue within the colorectal mesentery at this level which may reflect inflammatory soft tissue/phlegmon. This may be infectious or inflammatory in nature as can be seen with inflammatory bowel disease. However, an infiltrative mass with transmural extension could appear similarly. Correlation with endoscopy is recommended. 2. Extensive perirectal adenopathy, possibly reactive though infiltrative disease could appear similarly. 3. Resultant partial large bowel obstruction with moderate volume stool seen throughout the more proximal colon. 4. Medullary nephrocalcinosis. Differential considerations include systemic hypercalcemia, renal tubular acidosis, or hyperparathyroidism Electronically Signed   By: Dorethia Molt M.D.   On: 02/02/2024 02:49   Review of Systems  Constitutional:  Positive for appetite change, fatigue and unexpected weight change. Negative for chills, diaphoresis and fever.  HENT: Negative.  Negative for mouth sores.   Eyes: Negative.   Respiratory: Negative.  Negative for cough.   Cardiovascular: Negative.   Gastrointestinal:  Positive for abdominal pain, anal bleeding, blood in stool and diarrhea. Negative for constipation, nausea and rectal pain.  Endocrine: Negative.   Genitourinary: Negative.   Musculoskeletal:  Positive for back pain. Negative for arthralgias, gait problem, joint swelling and myalgias.  Skin: Negative.   Allergic/Immunologic: Negative.   Neurological:  Positive for weakness.  Hematological: Negative.   Psychiatric/Behavioral: Negative.     Blood pressure 122/79, pulse (!)  59, temperature 98.2 F (36.8 C), temperature source Oral, resp. rate 12, height 5' 4 (1.626 m), weight 45.4 kg, last menstrual period 01/27/2024, SpO2 100%. Physical Exam Constitutional:      General: She is not in acute distress.    Appearance: She is well-developed. She is not ill-appearing, toxic-appearing or diaphoretic.  HENT:     Head: Normocephalic and atraumatic.  Eyes:     Extraocular Movements: Extraocular movements intact.     Pupils: Pupils are equal, round, and reactive to light.  Cardiovascular:     Rate and Rhythm: Normal rate and regular rhythm.  Pulmonary:     Effort: Pulmonary effort is normal.     Breath sounds: Normal breath sounds.  Abdominal:     General: Bowel sounds are normal. There is no distension.  Palpations: Abdomen is soft.     Tenderness: There is abdominal tenderness in the right lower quadrant, suprapubic area and left lower quadrant.  Skin:    General: Skin is warm and dry.  Neurological:     General: No focal deficit present.     Mental Status: She is alert and oriented to person, place, and time.  Psychiatric:        Mood and Affect: Mood normal.        Behavior: Behavior normal.   Assessment/Plan: Rectal bleeding with change in bowel habits/abnormal CT scan-lower abdominal/pelvic pain-plans are to do a colonoscopy on the patient tomorrow to rule out IBD. If the patient unable to tolerate the prep due to any reason a flexible sigmoidoscopy will be helpful  Renaye Sous 02/02/2024, 12:48 PM

## 2024-02-02 NOTE — H&P (View-Only) (Signed)
 UNASSIGNED PATIENT Reason for Consult: Rectal bleeding with change in bowel habits. Referring Physician: [facility].  Amy Scott is an 33 y.o. female.  HPI: Amy Scott is a 33 year old black female with a 2-week history of lower abdominal pain with rectal bleeding and change in bowel habits where she has been having 6-7 small-volume BMs per day. There is bright red blood mixed with the stools. She claims she has never had the symptoms in the past. She denies using any nonsteroidals. There is no known family history of Crohn's or ulcerative colitis or colon cancer. A CT scan of the abdomen pelvis without contrast revealed circumferential bowel wall thickening Stanczyk perirectal inflammatory stranding involving the mid and high rectum with extensive inflammatory changes extending the presacral area along with nodular soft tissue enhancement of the colorectal mesentery which may represent inflammation versus a phlegmon extensive perirectal adenopathy was also noted; a question was raised about a partial large bowel obstruction with these findings but clinically the patient has been passing stool and flatus. Medullary nephrocalcinosis also noted  Past Medical History:  Diagnosis Date   Headache(784.0)    No pertinent past medical history    Past Surgical History:  Procedure Laterality Date   APPENDECTOMY     MOUTH SURGERY     History reviewed. No pertinent family history.  Social History:  reports that she has quit smoking. Her smoking use included cigarettes. She has never used smokeless tobacco. She reports that she does not drink alcohol and does not use drugs.  Allergies:  Allergies  Allergen Reactions   Morphine And Codeine Itching, Swelling, Hives and Shortness Of Breath   Medications: I have reviewed the patient's current medications. Prior to Admission:  No medications prior to admission.   Scheduled:  enoxaparin  (LOVENOX ) injection  30 mg Subcutaneous QHS    polyethylene glycol  17 g Oral Daily   Continuous:  sodium chloride  125 mL/hr at 02/02/24 1427   piperacillin -tazobactam (ZOSYN )  IV 3.375 g (02/02/24 1720)   PRN:HYDROmorphone  (DILAUDID ) injection, ondansetron  (ZOFRAN ) IV, oxyCODONE   Results for orders placed or performed during the hospital encounter of 02/02/24 (from the past 48 hours)  Urinalysis, Routine w reflex microscopic -Urine, Clean Catch     Status: Abnormal   Collection Time: 02/01/24 11:36 PM  Result Value Ref Range   Color, Urine AMBER (A) YELLOW    Comment: BIOCHEMICALS MAY BE AFFECTED BY COLOR   APPearance HAZY (A) CLEAR   Specific Gravity, Urine 1.020 1.005 - 1.030   pH 7.5 5.0 - 8.0   Glucose, UA 100 (A) NEGATIVE mg/dL   Hgb urine dipstick MODERATE (A) NEGATIVE   Bilirubin Urine MODERATE (A) NEGATIVE   Ketones, ur 40 (A) NEGATIVE mg/dL   Protein, ur 30 (A) NEGATIVE mg/dL   Nitrite NEGATIVE NEGATIVE   Leukocytes,Ua NEGATIVE NEGATIVE    Comment: Performed at [facility], [address] [place], [place] [address]  Urinalysis, Microscopic (reflex)     Status: Abnormal   Collection Time: 02/01/24 11:36 PM  Result Value Ref Range   RBC / HPF 11-20 0 - 5 RBC/hpf   WBC, UA NONE SEEN 0 - 5 WBC/hpf   Bacteria, UA RARE (A) NONE SEEN   Squamous Epithelial / HPF 0-5 0 - 5 /HPF   Mucus PRESENT     Comment: Performed at [facility], [address] [place], [place] [address]  CBC with Differential     Status: Abnormal   Collection Time:  02/01/24 11:38 PM  Result Value Ref Range   WBC 9.1 4.0 - 10.5 K/uL   RBC 3.98 3.87 - 5.11 MIL/uL   Hemoglobin 11.1 (L) 12.0 - 15.0 g/dL   HCT 66.9 (L) 63.9 - 53.9 %   MCV 82.9 80.0 - 100.0 fL   MCH 27.9 26.0 - 34.0 pg   MCHC 33.6 30.0 - 36.0 g/dL   RDW 84.5 88.4 - 84.4 %   Platelets 326 150 - 400 K/uL   nRBC 0.0 0.0 - 0.2 %   Neutrophils Relative % 67 %   Neutro Abs 5.9 1.7 - 7.7 K/uL   Lymphocytes Relative 26 %   Lymphs Abs 2.4 0.7 - 4.0 K/uL    Monocytes Relative 7 %   Monocytes Absolute 0.7 0.1 - 1.0 K/uL   Eosinophils Relative 0 %   Eosinophils Absolute 0.0 0.0 - 0.5 K/uL   Basophils Relative 0 %   Basophils Absolute 0.0 0.0 - 0.1 K/uL   Immature Granulocytes 0 %   Abs Immature Granulocytes 0.03 0.00 - 0.07 K/uL    Comment: Performed at [facility], [address] [place], [place] [address]  Comprehensive metabolic panel     Status: Abnormal   Collection Time: 02/01/24 11:38 PM  Result Value Ref Range   Sodium 142 135 - 145 mmol/L   Potassium 3.4 (L) 3.5 - 5.1 mmol/L   Chloride 107 98 - 111 mmol/L   CO2 23 22 - 32 mmol/L   Glucose, Bld 92 70 - 99 mg/dL    Comment: Glucose reference range applies only to samples taken after fasting for at least 8 hours.   BUN 9 6 - 20 mg/dL   Creatinine, Ser 9.32 0.44 - 1.00 mg/dL   Calcium 9.1 8.9 - 89.6 mg/dL   Total Protein 7.4 6.5 - 8.1 g/dL   Albumin 4.1 3.5 - 5.0 g/dL   AST 12 (L) 15 - 41 U/L   ALT <5 0 - 44 U/L   Alkaline Phosphatase 91 38 - 126 U/L   Total Bilirubin 0.5 0.0 - 1.2 mg/dL   GFR, Estimated >39 >39 mL/min    Comment: (NOTE) Calculated using the CKD-EPI Creatinine Equation (2021)    Anion gap 12 5 - 15    Comment: Performed at [facility], [address] [place], [place] [address]  Pregnancy, urine     Status: None   Collection Time: 02/01/24 11:38 PM  Result Value Ref Range   Preg Test, Ur NEGATIVE NEGATIVE    Comment:        THE SENSITIVITY OF THIS METHODOLOGY IS >20 mIU/mL. Performed at [facility], [address] [place], [place] [address]    CT Renal Bethena Study Result Date: 02/02/2024 CLINICAL DATA:  Right lower quadrant abdominal pain, history of appendectomy EXAM: CT ABDOMEN AND PELVIS WITHOUT CONTRAST TECHNIQUE: Multidetector CT imaging of the abdomen and pelvis was performed following the standard protocol without IV contrast. RADIATION DOSE REDUCTION: This exam was performed according to the departmental  dose-optimization program which includes automated exposure control, adjustment of the mA and/or kV according to patient size and/or use of iterative reconstruction technique. COMPARISON:  None Available. FINDINGS: Lower chest: No acute abnormality. Hepatobiliary: No focal liver abnormality is seen. No gallstones, gallbladder wall thickening, or biliary dilatation. Pancreas: Unremarkable Spleen: Unremarkable Adrenals/Urinary Tract: The adrenal glands are unremarkable. Fine calcifications are seen within the kidneys bilaterally in keeping medullary nephrocalcinosis. No urinary renal  or ureteral calculi are identified. No hydronephrosis. No perinephric inflammatory stranding or fluid collections are seen. Bladder is unremarkable Stomach/Bowel: There is circumferential bowel wall thickening and extensive perirectal inflammatory stranding involving the mid and high rectum with extensive inflammatory changes extending into the presacral space, best appreciated on image # 57/2 and 70/7. There is suggestion of nodular soft tissue within the colorectal mesentery at this level (102/5) which may reflect inflammatory soft tissue/phlegmon. This may be infectious or inflammatory in nature as can be seen with inflammatory bowel disease. However, an infiltrative mass with transmural extension could appear similarly. Extensive perirectal adenopathy is present, possibly reactive though infiltrative disease could appear similarly. There is a resultant partial large bowel obstruction with moderate volume stool seen throughout the more proximal colon. The stomach, small bowel, and large bowel are otherwise unremarkable. Appendix has been removed. No free intraperitoneal gas or fluid Vascular/Lymphatic: The left gonadal vein appears dilated but is not well assessed on this noncontrast examination. The abdominal vasculature is otherwise unremarkable. No additional pathologic adenopathy identified within the abdomen and pelvis.  Reproductive: Uterus and bilateral adnexa are unremarkable. Other: No abdominal wall hernia Musculoskeletal: No acute or significant osseous findings. IMPRESSION: 1. Circumferential bowel wall thickening and extensive perirectal inflammatory stranding involving the mid and high rectum with extensive inflammatory changes extending into the presacral space. There is suggestion of nodular soft tissue within the colorectal mesentery at this level which may reflect inflammatory soft tissue/phlegmon. This may be infectious or inflammatory in nature as can be seen with inflammatory bowel disease. However, an infiltrative mass with transmural extension could appear similarly. Correlation with endoscopy is recommended. 2. Extensive perirectal adenopathy, possibly reactive though infiltrative disease could appear similarly. 3. Resultant partial large bowel obstruction with moderate volume stool seen throughout the more proximal colon. 4. Medullary nephrocalcinosis. Differential considerations include systemic hypercalcemia, renal tubular acidosis, or hyperparathyroidism Electronically Signed   By: Dorethia Molt M.D.   On: 02/02/2024 02:49   Review of Systems  Constitutional:  Positive for appetite change, fatigue and unexpected weight change. Negative for chills, diaphoresis and fever.  HENT: Negative.  Negative for mouth sores.   Eyes: Negative.   Respiratory: Negative.  Negative for cough.   Cardiovascular: Negative.   Gastrointestinal:  Positive for abdominal pain, anal bleeding, blood in stool and diarrhea. Negative for constipation, nausea and rectal pain.  Endocrine: Negative.   Genitourinary: Negative.   Musculoskeletal:  Positive for back pain. Negative for arthralgias, gait problem, joint swelling and myalgias.  Skin: Negative.   Allergic/Immunologic: Negative.   Neurological:  Positive for weakness.  Hematological: Negative.   Psychiatric/Behavioral: Negative.     Blood pressure 122/79, pulse (!)  59, temperature 98.2 F (36.8 C), temperature source Oral, resp. rate 12, height 5' 4 (1.626 m), weight 45.4 kg, last menstrual period 01/27/2024, SpO2 100%. Physical Exam Constitutional:      General: She is not in acute distress.    Appearance: She is well-developed. She is not ill-appearing, toxic-appearing or diaphoretic.  HENT:     Head: Normocephalic and atraumatic.  Eyes:     Extraocular Movements: Extraocular movements intact.     Pupils: Pupils are equal, round, and reactive to light.  Cardiovascular:     Rate and Rhythm: Normal rate and regular rhythm.  Pulmonary:     Effort: Pulmonary effort is normal.     Breath sounds: Normal breath sounds.  Abdominal:     General: Bowel sounds are normal. There is no distension.  Palpations: Abdomen is soft.     Tenderness: There is abdominal tenderness in the right lower quadrant, suprapubic area and left lower quadrant.  Skin:    General: Skin is warm and dry.  Neurological:     General: No focal deficit present.     Mental Status: She is alert and oriented to person, place, and time.  Psychiatric:        Mood and Affect: Mood normal.        Behavior: Behavior normal.   Assessment/Plan: Rectal bleeding with change in bowel habits/abnormal CT scan-lower abdominal/pelvic pain-plans are to do a colonoscopy on the patient tomorrow to rule out IBD. If the patient unable to tolerate the prep due to any reason a flexible sigmoidoscopy will be helpful  Renaye Sous 02/02/2024, 12:48 PM

## 2024-02-03 ENCOUNTER — Encounter (HOSPITAL_COMMUNITY)
Admission: EM | Disposition: A | Discharge status: Home / Self Care | Payer: Self-pay | Source: Home / Self Care | Attending: Internal Medicine

## 2024-02-03 ENCOUNTER — Encounter (HOSPITAL_COMMUNITY): Payer: Self-pay | Admitting: Internal Medicine

## 2024-02-03 ENCOUNTER — Inpatient Hospital Stay (HOSPITAL_COMMUNITY): Admitting: Anesthesiology

## 2024-02-03 DIAGNOSIS — I10 Essential (primary) hypertension: Secondary | ICD-10-CM | POA: Diagnosis not present

## 2024-02-03 DIAGNOSIS — K5669 Other partial intestinal obstruction: Secondary | ICD-10-CM

## 2024-02-03 DIAGNOSIS — R109 Unspecified abdominal pain: Secondary | ICD-10-CM | POA: Diagnosis not present

## 2024-02-03 HISTORY — PX: FLEXIBLE SIGMOIDOSCOPY: SHX5431

## 2024-02-03 LAB — BASIC METABOLIC PANEL WITH GFR
Anion gap: 12 (ref 5–15)
BUN: <5 mg/dL — ABNORMAL LOW (ref 6–20)
CO2: 19 mmol/L — ABNORMAL LOW (ref 22–32)
Calcium: 8.4 mg/dL — ABNORMAL LOW (ref 8.9–10.3)
Chloride: 107 mmol/L (ref 98–111)
Creatinine, Ser: 0.51 mg/dL (ref 0.44–1.00)
GFR, Estimated: >60 mL/min (ref >60)
Glucose, Bld: 81 mg/dL (ref 70–99)
Potassium: 3.5 mmol/L (ref 3.5–5.1)
Sodium: 137 mmol/L (ref 135–145)

## 2024-02-03 LAB — CBC
HCT: 29.7 % — ABNORMAL LOW (ref 36.0–46.0)
Hemoglobin: 9.4 g/dL — ABNORMAL LOW (ref 12.0–15.0)
MCH: 26.9 pg (ref 26.0–34.0)
MCHC: 31.6 g/dL (ref 30.0–36.0)
MCV: 84.9 fL (ref 80.0–100.0)
Platelets: 293 K/uL (ref 150–400)
RBC: 3.5 MIL/uL — ABNORMAL LOW (ref 3.87–5.11)
RDW: 15.1 % (ref 11.5–15.5)
WBC: 6.9 K/uL (ref 4.0–10.5)
nRBC: 0 % (ref 0.0–0.2)

## 2024-02-03 LAB — GASTROINTESTINAL PANEL BY PCR, STOOL (REPLACES STOOL CULTURE)

## 2024-02-03 SURGERY — SIGMOIDOSCOPY, FLEXIBLE
Anesthesia: Monitor Anesthesia Care

## 2024-02-03 MED ORDER — SODIUM CHLORIDE 0.9 % IV SOLN: INTRAVENOUS | Status: DC | PRN

## 2024-02-03 MED ORDER — PROPOFOL 10 MG/ML IV BOLUS
INTRAVENOUS | Status: DC | PRN
  Administered 2024-02-03: 30 mg via INTRAVENOUS
  Administered 2024-02-03: 40 mg via INTRAVENOUS
  Administered 2024-02-03: 30 mg via INTRAVENOUS
  Administered 2024-02-03 (×2): 40 mg via INTRAVENOUS

## 2024-02-03 MED ORDER — PROPOFOL 500 MG/50ML IV EMUL
INTRAVENOUS | Status: DC | PRN
  Administered 2024-02-03: 150 ug/kg/min via INTRAVENOUS

## 2024-02-03 MED ORDER — SODIUM CHLORIDE 0.9 % IV SOLN: INTRAVENOUS | Status: DC

## 2024-02-03 MED ORDER — LIDOCAINE 2% (20 MG/ML) 5 ML SYRINGE
INTRAMUSCULAR | Status: DC | PRN
  Administered 2024-02-03: 60 mg via INTRAVENOUS
  Administered 2024-02-03: 40 mg via INTRAVENOUS

## 2024-02-03 NOTE — Transfer of Care (Signed)
 Immediate Anesthesia Transfer of Care Note  Patient: Amy Scott  Procedure(s) Performed: KINGSTON SIDE  Patient Location: PACU  Anesthesia Type:MAC  Level of Consciousness: drowsy  Airway & Oxygen Therapy: Patient Spontanous Breathing  Post-op Assessment: Report given to RN and Post -op Vital signs reviewed and stable  Post vital signs: Reviewed and stable  Last Vitals:  Vitals Value Taken Time  BP 111/54 02/03/24 14:13  Temp    Pulse 62 02/03/24 14:13  Resp 17 02/03/24 14:13  SpO2 100 % 02/03/24 14:13  Vitals shown include unfiled device data.  Last Pain:  Vitals:   02/03/24 1248  TempSrc: Temporal  PainSc: 4       Patients Stated Pain Goal: 4 (02/03/24 1248)  Complications: No notable events documented.

## 2024-02-03 NOTE — Progress Notes (Signed)
   02/03/24 1019  TOC Brief Assessment  Insurance and Status Reviewed  Patient has primary care physician No  Home environment has been reviewed single family home  Prior level of function: independent  Prior/Current Home Services No current home services  Social Drivers of Health Review SDOH reviewed no interventions necessary  Readmission risk has been reviewed Yes  Transition of care needs no transition of care needs at this time    Pt does not have PCP listed in chart, however pt has active [facility]. No further needs at this time.   Signed: Heather Saltness, MSW, LCSW Clinical Social Worker Inpatient Care Management 02/03/2024 10:20 AM

## 2024-02-03 NOTE — Progress Notes (Signed)
  Subjective No acute events.  Feeling about the same as yesterday.  Completed part of the bowel prep for potential colonoscopy.  Did have some nausea vomiting with the prep.  Objective: Vital signs in last 24 hours: Temp:  [97.9 F (36.6 C)-99.2 F (37.3 C)] 98.4 F (36.9 C) (09/04 0348) Pulse Rate:  [49-65] 65 (09/04 0348) Resp:  [11-19] 16 (09/04 0348) BP: (101-142)/(72-91) 107/72 (09/04 0348) SpO2:  [100 %] 100 % (09/04 0348) Last BM Date : 02/02/24  Intake/Output from previous day: 09/03 0701 - 09/04 0700 In: 2362.7 [P.O.:400; I.V.:1860.4; IV Piggyback:102.3] Out: -  Intake/Output this shift: No intake/output data recorded.  Gen: NAD, comfortable CV: RRR Pulm: Normal work of breathing Abd: Soft, NT/ND  Ext: SCDs in place  Lab Results: CBC  Recent Labs    02/01/24 2338 02/03/24 0533  WBC 9.1 6.9  HGB 11.1* 9.4*  HCT 33.0* 29.7*  PLT 326 293   BMET Recent Labs    02/01/24 2338 02/03/24 0533  NA 142 137  K 3.4* 3.5  CL 107 107  CO2 23 19*  GLUCOSE 92 81  BUN 9 <5*  CREATININE 0.67 0.51  CALCIUM 9.1 8.4*   PT/INR No results for input(s): LABPROT, INR in the last 72 hours. ABG No results for input(s): PHART, HCO3 in the last 72 hours.  Invalid input(s): PCO2, PO2  Studies/Results:  Anti-infectives: Anti-infectives (From admission, onward)    Start     Dose/Rate Route Frequency Ordered Stop   02/02/24 1030  piperacillin -tazobactam (ZOSYN ) IVPB 3.375 g        3.375 g 12.5 mL/hr over 240 Minutes Intravenous Every 8 hours 02/02/24 1020     02/02/24 0400  piperacillin -tazobactam (ZOSYN ) IVPB 3.375 g        3.375 g 100 mL/hr over 30 Minutes Intravenous  Once 02/02/24 0346 02/02/24 0455        Assessment/Plan: Patient Active Problem List   Diagnosis Date Noted   Abdominal pain 02/02/2024   Normocytic anemia 02/02/2024   Acute pyelonephritis 05/06/2018   Preeclampsia in postpartum period 07/22/2017   Chlamydia infection affecting  pregnancy 07/02/2017   History of smoking 02/11/2016   33 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  -  Agree with plans underway for endoscopic evaluation. - We will continue to follow with you   LOS: 1 day   I spent a total of 35 minutes in both face-to-face and non-face-to-face activities, excluding procedures performed, for this visit on the date of this encounter.  Lonni Pizza, MD [facility]

## 2024-02-03 NOTE — Anesthesia Preprocedure Evaluation (Signed)
 Anesthesia Evaluation  Patient identified by MRN, date of birth, ID band Patient awake    Reviewed: Allergy & Precautions, NPO status , Patient's Chart, lab work & pertinent test results  Airway Mallampati: II  TM Distance: >3 FB Neck ROM: Full    Dental  (+) Teeth Intact, Dental Advisory Given   Pulmonary Current Smoker and Patient abstained from smoking., former smoker   Pulmonary exam normal breath sounds clear to auscultation       Cardiovascular hypertension, Normal cardiovascular exam Rhythm:Regular Rate:Normal     Neuro/Psych  Headaches  negative psych ROS   GI/Hepatic negative GI ROS,,,(+)     substance abuse  marijuana use  Endo/Other  negative endocrine ROS    Renal/GU negative Renal ROS     Musculoskeletal negative musculoskeletal ROS (+)    Abdominal   Peds  Hematology  (+) Blood dyscrasia, anemia   Anesthesia Other Findings Day of surgery medications reviewed with the patient.  Reproductive/Obstetrics                              Anesthesia Physical Anesthesia Plan  ASA: 2  Anesthesia Plan: MAC   Post-op Pain Management: Minimal or no pain anticipated   Induction: Intravenous  PONV Risk Score and Plan: 1 and TIVA and Treatment may vary due to age or medical condition  Airway Management Planned: Natural Airway and Simple Face Mask  Additional Equipment:   Intra-op Plan:   Post-operative Plan:   Informed Consent: I have reviewed the patients History and Physical, chart, labs and discussed the procedure including the risks, benefits and alternatives for the proposed anesthesia with the patient or authorized representative who has indicated his/her understanding and acceptance.     Dental advisory given  Plan Discussed with: CRNA  Anesthesia Plan Comments:         Anesthesia Quick Evaluation

## 2024-02-03 NOTE — Anesthesia Postprocedure Evaluation (Signed)
 Anesthesia Post Note  Patient: Amy Scott  Procedure(s) Performed: KINGSTON SIDE     Patient location during evaluation: Endoscopy Anesthesia Type: MAC Level of consciousness: oriented, awake and alert and awake Pain management: pain level controlled Vital Signs Assessment: post-procedure vital signs reviewed and stable Respiratory status: spontaneous breathing, nonlabored ventilation, respiratory function stable and patient connected to nasal cannula oxygen Cardiovascular status: blood pressure returned to baseline and stable Postop Assessment: no headache, no backache and no apparent nausea or vomiting Anesthetic complications: no   No notable events documented.  Last Vitals:  Vitals:   02/03/24 1420 02/03/24 1425  BP: (!) 147/93   Pulse: (!) 135   Resp: (!) 21 13  Temp:    SpO2: 91%     Last Pain:  Vitals:   02/03/24 1415  TempSrc:   PainSc: 0-No pain                 Garnette FORBES Skillern

## 2024-02-03 NOTE — Interval H&P Note (Signed)
 History and Physical Interval Note:  02/03/2024 1:42 PM  Amy Scott  has presented today for surgery, with the diagnosis of Rectal bleeding with abnormal CT scan.  The various methods of treatment have been discussed with the patient and family. After consideration of risks, benefits and other options for treatment, the patient has consented to  Procedure(s) with comments: SIGMOIDOSCOPY, FLEXIBLE (N/A) - rectal bleeding/abnormal CT scan. as a surgical intervention.  The patient's history has been reviewed, patient examined, no change in status, stable for surgery.  I have reviewed the patient's chart and labs.  Questions were answered to the patient's satisfaction.     Keyle Doby D

## 2024-02-03 NOTE — Progress Notes (Addendum)
 Pt off unit for procedure at 1237. Transported by staff in wheelchair.

## 2024-02-03 NOTE — Plan of Care (Signed)

## 2024-02-03 NOTE — Op Note (Signed)
 [facility] Patient Name: Amy Scott Procedure Date: 02/03/2024 MRN: [id number] Attending MD: Belvie Just , MD, [id number] Date of Birth: [birth year] CSN: [id number] Age: 33 Admit Type: Inpatient Procedure:                Flexible Sigmoidoscopy Indications:              Abnormal CT of the GI tract Providers:                Belvie Just, MD, Ozell Pouch, Corky Czech,                            Technician Referring MD:              Medicines:                Propofol  per Anesthesia Complications:            No immediate complications. Estimated Blood Loss:     Estimated blood loss: none. Procedure:                Pre-Anesthesia Assessment:                           - Prior to the procedure, a History and Physical                            was performed, and patient medications and                            allergies were reviewed. The patient's tolerance of                            previous anesthesia was also reviewed. The risks                            and benefits of the procedure and the sedation                            options and risks were discussed with the patient.                            All questions were answered, and informed consent                            was obtained. Prior Anticoagulants: The patient has                            taken no anticoagulant or antiplatelet agents. ASA                            Grade Assessment: II - A patient with mild systemic                            disease. After reviewing the risks and benefits,  the patient was deemed in satisfactory condition to                            undergo the procedure.                           - Sedation was administered by an anesthesia                            professional. Deep sedation was attained.                           After obtaining informed consent, the scope was                            passed under direct vision. The  [id number]-[id number] ([id number])                            Olympus endoscope was introduced through the anus                            and advanced to the the sigmoid colon. The flexible                            sigmoidoscopy was accomplished without difficulty.                            The patient tolerated the procedure well. The                            quality of the bowel preparation was adequate. Scope In: Scope Out: Findings:      A fungating, infiltrative and ulcerated partially obstructing large mass       was found in the recto-sigmoid colon. The mass was circumferential. The       mass measured three cm in length. No bleeding was present. Biopsies were       taken with a cold forceps for histology.      The prep was poor, but with extensive lavage good to excellent views       were obtained. A large circumfirental nearly complete obstructing mass       was identified. It was approximately 8 cm from the anal verge. With       gentle tip manipulation the endoscope was able to traverse the 2.5-3 cm       lesion. Proximally there was liquid stool. Multiple cold biopsies were       obtained. A rectal examination confirmed the findings. It was a hard and       fixed circumfirential lesion. 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 Biopsied. Moderate Sedation:      Not Applicable - Patient had care per Anesthesia. Recommendation:           - Return patient  to hospital ward for ongoing care.                           - NPO.                           - Await pathology results. 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 All rights reserved. The codes documented in this report are preliminary and upon coder review may  be revised to meet current compliance requirements. Belvie Just, MD Belvie Just, MD 02/03/2024 2:17:58 PM This report has been signed electronically. Number of Addenda: 0

## 2024-02-03 NOTE — Progress Notes (Signed)
 PROGRESS NOTE  Amy Scott  FMW:[id number] DOB: Apr 27, 1991 DOA: 02/02/2024 PCP: Patient, No Pcp Per   Brief Narrative:  Amy Scott is a 33 y.o. female with no significant past medical history presented to med Center at [facility] with complaint of right flank pain for 2 weeks. CT renal stone study showed circumferential bowel wall thickening and extensive perirectal inflammatory stranding involving the mid and high rectum with extensive inflammatory changes extending into the presacral space.  Findings suspicious for inflammatory bowel disease.  Started on antibiotic.  GI, general surgery consulted.  Plan for colonoscopy.  Assessment & Plan:  Principal Problem:   Abdominal pain Active Problems:   Normocytic anemia   Abdominal pain/suspected inflammatory bowel disease: No significant past medical history.  Report of right flank pain since last 2 weeks, progressively worse.  No report of fever or gross bloody bowel movements.  Had seen some spots of fresh blood mixed with the stool. Imaging showed circumferential bowel wall thickening and extensive perirectal inflammatory stranding involving the mid and high rectum with extensive inflammatory changes extending into the presacral space. Also showed nodular soft tissue within the colorectal mesentery at this level which may reflect inflammatory soft tissue/phlegmon.  Suspected inflammatory bowel disease.  GI already following.  Currently on Zosyn .  Negative  GI pathogen panel. Abdomen is not distended, has good bowel sounds.  Complains of suprapubic pain.  Continue pain management, supportive care.  Plan for colonoscopy.   Partial large bowel obstruction: Partial large bowel obstruction with moderate volume stool seen throughout the more proximal colon.  General surgery already consulted.  Patient is now having bowel movements   Hypokalemia: Supplemented with potassium and corrected        DVT prophylaxis:enoxaparin  (LOVENOX )  injection 30 mg Start: 02/02/24 2200     Code Status: Prior  Family Communication: None at the bedside  Patient status:Inpatient  Patient is from :Home  Anticipated discharge un:Ynfz  Estimated DC date:after full work up   Consultants: None  Procedures:None  Antimicrobials:  Anti-infectives (From admission, onward)    Start     Dose/Rate Route Frequency Ordered Stop   02/02/24 1030  piperacillin -tazobactam (ZOSYN ) IVPB 3.375 g        3.375 g 12.5 mL/hr over 240 Minutes Intravenous Every 8 hours 02/02/24 1020     02/02/24 0400  piperacillin -tazobactam (ZOSYN ) IVPB 3.375 g        3.375 g 100 mL/hr over 30 Minutes Intravenous  Once 02/02/24 0346 02/02/24 0455       Subjective: Patient seen and examined at bedside today.  Hemodynamically stable.  Had a bowel movement this morning without trace of blood.  Still having lower abdominal discomfort.  No nausea or vomiting.  Afebrile.  Objective: Vitals:   02/02/24 1524 02/02/24 2042 02/03/24 0000 02/03/24 0348  BP: (!) 129/91 131/80 130/85 107/72  Pulse: 65 63 64 65  Resp: 19 16 16 16   Temp: 98.1 F (36.7 C) 99.2 F (37.3 C) 98.2 F (36.8 C) 98.4 F (36.9 C)  TempSrc:      SpO2:  100% 100% 100%  Weight:      Height:        Intake/Output Summary (Last 24 hours) at 02/03/2024 1158 Last data filed at 02/03/2024 0341 Gross per 24 hour  Intake 2362.71 ml  Output --  Net 2362.71 ml   Filed Weights   02/01/24 2334  Weight: 45.4 kg    Examination:  General exam: Overall comfortable, not in distress HEENT: PERRL  Respiratory system:  no wheezes or crackles  Cardiovascular system: S1 & S2 heard, RRR.  Gastrointestinal system: Abdomen is nondistended, soft and good bowel sounds.  Tender on the suprapubic region Central nervous system: Alert and oriented Extremities: No edema, no clubbing ,no cyanosis Skin: No rashes, no ulcers,no icterus     Data Reviewed: I have personally reviewed following labs and imaging  studies  CBC: Recent Labs  Lab 02/01/24 2338 02/03/24 0533  WBC 9.1 6.9  NEUTROABS 5.9  --   HGB 11.1* 9.4*  HCT 33.0* 29.7*  MCV 82.9 84.9  PLT 326 293   Basic Metabolic Panel: Recent Labs  Lab 02/01/24 2338 02/03/24 0533  NA 142 137  K 3.4* 3.5  CL 107 107  CO2 23 19*  GLUCOSE 92 81  BUN 9 <5*  CREATININE 0.67 0.51  CALCIUM 9.1 8.4*     Recent Results (from the past 240 hours)  Blood culture (routine x 2)     Status: None (Preliminary result)   Collection Time: 02/02/24  3:55 AM   Specimen: BLOOD  Result Value Ref Range Status   Specimen Description   Final    BLOOD RIGHT ANTECUBITAL Performed at [facility], [address] [place], [place] [address]    Special Requests   Final    BOTTLES DRAWN AEROBIC AND ANAEROBIC Blood Culture adequate volume Performed at [facility], [address] [place], [place] [address]    Culture   Final    NO GROWTH < 24 HOURS Performed at [facility], [address], [place], [place] [address]    Report Status PENDING  Incomplete  Blood culture (routine x 2)     Status: None (Preliminary result)   Collection Time: 02/02/24  4:00 AM   Specimen: BLOOD  Result Value Ref Range Status   Specimen Description   Final    BLOOD LEFT ANTECUBITAL Performed at [facility], [address] [place], [place] [address]    Special Requests   Final    BOTTLES DRAWN AEROBIC AND ANAEROBIC Blood Culture adequate volume Performed at [facility], [address] [place], [place] [address]    Culture   Final    NO GROWTH < 24 HOURS Performed at [facility], [address] [place], [place] [address]    Report Status PENDING  Incomplete  Gastrointestinal Panel by PCR , Stool     Status: None   Collection Time: 02/02/24  7:14 PM   Specimen: Stool  Result Value Ref Range Status   Campylobacter species NOT DETECTED NOT DETECTED Final   Plesimonas shigelloides NOT DETECTED NOT  DETECTED Final   Salmonella species NOT DETECTED NOT DETECTED Final   Yersinia enterocolitica NOT DETECTED NOT DETECTED Final   Vibrio species NOT DETECTED NOT DETECTED Final   Vibrio cholerae NOT DETECTED NOT DETECTED Final   Enteroaggregative E coli (EAEC) NOT DETECTED NOT DETECTED Final   Enteropathogenic E coli (EPEC) NOT DETECTED NOT DETECTED Final   Enterotoxigenic E coli (ETEC) NOT DETECTED NOT DETECTED Final   Shiga like toxin producing E coli (STEC) NOT DETECTED NOT DETECTED Final   Shigella/Enteroinvasive E coli (EIEC) NOT DETECTED NOT DETECTED Final   Cryptosporidium NOT DETECTED NOT DETECTED Final   Cyclospora cayetanensis NOT DETECTED NOT DETECTED Final   Entamoeba histolytica NOT DETECTED NOT DETECTED Final   Giardia lamblia NOT DETECTED NOT DETECTED Final   Adenovirus F40/41  NOT DETECTED NOT DETECTED Final   Astrovirus NOT DETECTED NOT DETECTED Final   Norovirus GI/GII NOT DETECTED NOT DETECTED Final   Rotavirus A NOT DETECTED NOT DETECTED Final   Sapovirus (I, II, IV, and V) NOT DETECTED NOT DETECTED Final    Comment: Performed at [facility], [address] [place], [place] [address]     Radiology Studies: CT Renal Stone Study Result Date: 02/02/2024 CLINICAL DATA:  Right lower quadrant abdominal pain, history of appendectomy EXAM: CT ABDOMEN AND PELVIS WITHOUT CONTRAST TECHNIQUE: Multidetector CT imaging of the abdomen and pelvis was performed following the standard protocol without IV contrast. RADIATION DOSE REDUCTION: This exam was performed according to the departmental dose-optimization program which includes automated exposure control, adjustment of the mA and/or kV according to patient size and/or use of iterative reconstruction technique. COMPARISON:  None Available. FINDINGS: Lower chest: No acute abnormality. Hepatobiliary: No focal liver abnormality is seen. No gallstones, gallbladder wall thickening, or biliary dilatation. Pancreas: Unremarkable  Spleen: Unremarkable Adrenals/Urinary Tract: The adrenal glands are unremarkable. Fine calcifications are seen within the kidneys bilaterally in keeping medullary nephrocalcinosis. No urinary renal or ureteral calculi are identified. No hydronephrosis. No perinephric inflammatory stranding or fluid collections are seen. Bladder is unremarkable Stomach/Bowel: There is circumferential bowel wall thickening and extensive perirectal inflammatory stranding involving the mid and high rectum with extensive inflammatory changes extending into the presacral space, best appreciated on image # 57/2 and 70/7. There is suggestion of nodular soft tissue within the colorectal mesentery at this level (102/5) which may reflect inflammatory soft tissue/phlegmon. This may be infectious or inflammatory in nature as can be seen with inflammatory bowel disease. However, an infiltrative mass with transmural extension could appear similarly. Extensive perirectal adenopathy is present, possibly reactive though infiltrative disease could appear similarly. There is a resultant partial large bowel obstruction with moderate volume stool seen throughout the more proximal colon. The stomach, small bowel, and large bowel are otherwise unremarkable. Appendix has been removed. No free intraperitoneal gas or fluid Vascular/Lymphatic: The left gonadal vein appears dilated but is not well assessed on this noncontrast examination. The abdominal vasculature is otherwise unremarkable. No additional pathologic adenopathy identified within the abdomen and pelvis. Reproductive: Uterus and bilateral adnexa are unremarkable. Other: No abdominal wall hernia Musculoskeletal: No acute or significant osseous findings. IMPRESSION: 1. Circumferential bowel wall thickening and extensive perirectal inflammatory stranding involving the mid and high rectum with extensive inflammatory changes extending into the presacral space. There is suggestion of nodular soft tissue  within the colorectal mesentery at this level which may reflect inflammatory soft tissue/phlegmon. This may be infectious or inflammatory in nature as can be seen with inflammatory bowel disease. However, an infiltrative mass with transmural extension could appear similarly. Correlation with endoscopy is recommended. 2. Extensive perirectal adenopathy, possibly reactive though infiltrative disease could appear similarly. 3. Resultant partial large bowel obstruction with moderate volume stool seen throughout the more proximal colon. 4. Medullary nephrocalcinosis. Differential considerations include systemic hypercalcemia, renal tubular acidosis, or hyperparathyroidism Electronically Signed   By: Dorethia Molt M.D.   On: 02/02/2024 02:49    Scheduled Meds:  enoxaparin  (LOVENOX ) injection  30 mg Subcutaneous QHS   polyethylene glycol  17 g Oral Daily   polyethylene glycol-electrolytes  2,000 mL Oral Once   Continuous Infusions:  sodium chloride  Stopped (02/03/24 0217)   sodium chloride      sodium chloride      sodium chloride      piperacillin -tazobactam (ZOSYN )  IV 3.375 g (02/03/24  1152)     LOS: 1 day   Ivonne Mustache, MD [facility] P9/08/2023, 11:58 AM

## 2024-02-04 ENCOUNTER — Other Ambulatory Visit: Payer: Self-pay | Admitting: *Deleted

## 2024-02-04 ENCOUNTER — Inpatient Hospital Stay (HOSPITAL_COMMUNITY)

## 2024-02-04 DIAGNOSIS — C19 Malignant neoplasm of rectosigmoid junction: Secondary | ICD-10-CM

## 2024-02-04 DIAGNOSIS — R109 Unspecified abdominal pain: Secondary | ICD-10-CM | POA: Diagnosis not present

## 2024-02-04 MED ORDER — IOHEXOL 9 MG/ML PO SOLN
500.0000 mL | ORAL | Status: AC
  Administered 2024-02-04 (×2): 500 mL via ORAL

## 2024-02-04 MED ORDER — POLYETHYLENE GLYCOL 3350 17 G PO PACK
17.0000 g | PACK | Freq: Two times a day (BID) | ORAL | Status: DC
  Administered 2024-02-04 – 2024-02-07 (×5): 17 g via ORAL
  Filled 2024-02-04 (×6): qty 1

## 2024-02-04 MED ORDER — IOHEXOL 300 MG/ML  SOLN
100.0000 mL | Freq: Once | INTRAMUSCULAR | Status: AC | PRN
  Administered 2024-02-04: 85 mL via INTRAVENOUS

## 2024-02-04 MED ORDER — HYDROMORPHONE HCL 1 MG/ML IJ SOLN
0.5000 mg | Freq: Once | INTRAMUSCULAR | Status: AC
  Administered 2024-02-04: 0.5 mg via INTRAVENOUS
  Filled 2024-02-04: qty 0.5

## 2024-02-04 MED ORDER — FLEET ENEMA RE ENEM
1.0000 | ENEMA | Freq: Once | RECTAL | Status: AC
  Administered 2024-02-04: 1 via RECTAL
  Filled 2024-02-04: qty 1

## 2024-02-04 NOTE — Plan of Care (Signed)

## 2024-02-04 NOTE — Progress Notes (Signed)
Referral placed for medical oncology.

## 2024-02-04 NOTE — Progress Notes (Signed)
  Subjective No acute events.  Feeling the same as yesterday.  Objective: Vital signs in last 24 hours: Temp:  [98.1 F (36.7 C)-98.8 F (37.1 C)] 98.8 F (37.1 C) (09/04 2049) Pulse Rate:  [53-135] 60 (09/04 2049) Resp:  [11-21] 16 (09/04 2049) BP: (111-147)/(54-93) 115/69 (09/04 2049) SpO2:  [100 %] 100 % (09/04 2049) Weight:  [45.4 kg] 45.4 kg (09/04 1248) Last BM Date : 02/03/24  Intake/Output from previous day: 09/04 0701 - 09/05 0700 In: 370 [P.O.:120; I.V.:250] Out: -  Intake/Output this shift: No intake/output data recorded.  Gen: NAD, comfortable CV: RRR Pulm: Normal work of breathing Abd: Soft, NT/ND  Ext: SCDs in place  Lab Results: CBC  Recent Labs    02/01/24 2338 02/03/24 0533  WBC 9.1 6.9  HGB 11.1* 9.4*  HCT 33.0* 29.7*  PLT 326 293   BMET Recent Labs    02/01/24 2338 02/03/24 0533  NA 142 137  K 3.4* 3.5  CL 107 107  CO2 23 19*  GLUCOSE 92 81  BUN 9 <5*  CREATININE 0.67 0.51  CALCIUM 9.1 8.4*   PT/INR No results for input(s): LABPROT, INR in the last 72 hours. ABG No results for input(s): PHART, HCO3 in the last 72 hours.  Invalid input(s): PCO2, PO2  Studies/Results:  Anti-infectives: Anti-infectives (From admission, onward)    Start     Dose/Rate Route Frequency Ordered Stop   02/02/24 1030  piperacillin -tazobactam (ZOSYN ) IVPB 3.375 g        3.375 g 12.5 mL/hr over 240 Minutes Intravenous Every 8 hours 02/02/24 1020     02/02/24 0400  piperacillin -tazobactam (ZOSYN ) IVPB 3.375 g        3.375 g 100 mL/hr over 30 Minutes Intravenous  Once 02/02/24 0346 02/02/24 0455        Assessment/Plan: Patient Active Problem List   Diagnosis Date Noted   Abdominal pain 02/02/2024   Normocytic anemia 02/02/2024   Acute pyelonephritis 05/06/2018   Preeclampsia in postpartum period 07/22/2017   Chlamydia infection affecting pregnancy 07/02/2017   History of smoking 02/11/2016   33 year old female with suspected  newly diagnosed rectal cancer  - Findings on scope concerning for proximal and mid rectal cancer.  I have discussed with Dr. Rollin as well.  He noted that he was able to traverse this with the colonoscope.  We have therefore discussed twice daily MiraLAX  for time being and further workup.  Plans underway for pelvic MRI, CT chest/abdomen/pelvis with contrast, and CEA level.  Medical and radiation oncology consultation has been recommended. - Will continue to follow with you.   LOS: 2 days   I spent a total of 40 minutes in both face-to-face and non-face-to-face activities, excluding procedures performed, for this visit on the date of this encounter.  Lonni Pizza, MD [facility], [facility]

## 2024-02-04 NOTE — Plan of Care (Signed)

## 2024-02-04 NOTE — Progress Notes (Signed)
 PROGRESS NOTE  Amy Scott  FMW:[id number] DOB: May 28, 1991 DOA: 02/02/2024 PCP: Patient, No Pcp Per   Brief Narrative: Amy Scott is a 33 y.o. female with no significant past medical history presented to med Center at [facility] with complaint of right flank pain for 2 weeks. CT renal stone study showed circumferential bowel wall thickening and extensive perirectal inflammatory stranding involving the mid and high rectum with extensive inflammatory changes extending into the presacral space.  Findings suspicious for inflammatory bowel disease.  Started on antibiotic.  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  General surgery following.  Plan for staging workup with MRI of the pelvis and CT chest/abdomen/pelvis.  Oncology already arranged outpatient follow-up.  Assessment & Plan:  Principal Problem:   Abdominal pain Active Problems:   Normocytic anemia   Abdominal pain/rectal cancer: No significant past medical history.  Report of right flank pain since last 2 weeks, progressively worse.  No report of fever or gross bloody bowel movements.  Had seen some spots of fresh blood mixed with the stool. Imaging showed circumferential bowel wall thickening and extensive perirectal inflammatory stranding involving the mid and high rectum with extensive inflammatory changes extending into the presacral space. Also showed nodular soft tissue within the colorectal mesentery at this level which may reflect inflammatory soft tissue/phlegmon.  Initially suspected inflammatory bowel disease  Started on Zosyn .  Negative  GI pathogen panel. 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  General surgery following.  Plan for staging workup with MRI of the pelvis and CT chest/abdomen/pelvis.  Oncology already arranged outpatient  follow-up.  Partial large bowel obstruction: Partial large bowel obstruction with moderate volume stool seen throughout the more proximal colon.  This is secondary to malignant  mass.  Continue MiraLAX  twice daily.  Having regular bowel movement now.  Hypokalemia: Supplemented with potassium and corrected        DVT prophylaxis:enoxaparin  (LOVENOX ) injection 30 mg Start: 02/02/24 2200     Code Status: Prior  Family Communication: Family members at bedside on 9/5  Patient status:Inpatient  Patient is from :Home  Anticipated discharge un:Ynfz  Estimated DC date:after full work up   Consultants: GI, general surgery  Procedures:None  Antimicrobials:  Anti-infectives (From admission, onward)    Start     Dose/Rate Route Frequency Ordered Stop   02/02/24 1030  piperacillin -tazobactam (ZOSYN ) IVPB 3.375 g        3.375 g 12.5 mL/hr over 240 Minutes Intravenous Every 8 hours 02/02/24 1020     02/02/24 0400  piperacillin -tazobactam (ZOSYN ) IVPB 3.375 g        3.375 g 100 mL/hr over 30 Minutes Intravenous  Once 02/02/24 0346 02/02/24 0455       Subjective: Seen and examined at the bedside today.  Appears comfortable.  No nausea or vomiting.  Having regular bowel movements without trace of blood.  Continues to have some discomfort on the lower abdomen mainly on defecation.  We discussed about colonoscopy findings and further steps ahead.  All questions answered  Objective: Vitals:   02/03/24 1450 02/03/24 1455 02/03/24 1520 02/03/24 2049  BP: (!) 142/92  121/78 115/69  Pulse: 62 61 61 60  Resp: 16 12 18 16   Temp:   98.1 F (36.7 C) 98.8 F (37.1 C)  TempSrc:   Oral   SpO2: 100% 100% 100% 100%  Weight:  Height:        Intake/Output Summary (Last 24 hours) at 02/04/2024 1121 Last data filed at 02/03/2024 2018 Gross per 24 hour  Intake 370 ml  Output --  Net 370 ml   Filed Weights   02/01/24 2334 02/03/24 1248  Weight: 45.4 kg 45.4 kg     Examination:  General exam: Overall comfortable, not in distress HEENT: PERRL Respiratory system:  no wheezes or crackles  Cardiovascular system: S1 & S2 heard, RRR.  Gastrointestinal system: Abdomen is nondistended, soft .  Bowel sounds present.  Suprapubic tenderness Central nervous system: Alert and oriented Extremities: No edema, no clubbing ,no cyanosis Skin: No rashes, no ulcers,no icterus     Data Reviewed: I have personally reviewed following labs and imaging studies  CBC: Recent Labs  Lab 02/01/24 2338 02/03/24 0533  WBC 9.1 6.9  NEUTROABS 5.9  --   HGB 11.1* 9.4*  HCT 33.0* 29.7*  MCV 82.9 84.9  PLT 326 293   Basic Metabolic Panel: Recent Labs  Lab 02/01/24 2338 02/03/24 0533  NA 142 137  K 3.4* 3.5  CL 107 107  CO2 23 19*  GLUCOSE 92 81  BUN 9 <5*  CREATININE 0.67 0.51  CALCIUM 9.1 8.4*     Recent Results (from the past 240 hours)  Blood culture (routine x 2)     Status: None (Preliminary result)   Collection Time: 02/02/24  3:55 AM   Specimen: BLOOD  Result Value Ref Range Status   Specimen Description   Final    BLOOD RIGHT ANTECUBITAL Performed at [facility], [address] [place], [place] [address]    Special Requests   Final    BOTTLES DRAWN AEROBIC AND ANAEROBIC Blood Culture adequate volume Performed at [facility], [address] [place], [place] [address]    Culture   Final    NO GROWTH 2 DAYS Performed at [facility], [address] [place], [place] [address]    Report Status PENDING  Incomplete  Blood culture (routine x 2)     Status: None (Preliminary result)   Collection Time: 02/02/24  4:00 AM   Specimen: BLOOD  Result Value Ref Range Status   Specimen Description   Final    BLOOD LEFT ANTECUBITAL Performed at [facility], [address] [place], [place] [address]    Special Requests   Final    BOTTLES DRAWN AEROBIC AND ANAEROBIC Blood Culture adequate volume Performed  at [facility], [address] [place], [place] [address]    Culture   Final    NO GROWTH 2 DAYS Performed at [facility], [address] [place], [place] [address]    Report Status PENDING  Incomplete  Gastrointestinal Panel by PCR , Stool     Status: None   Collection Time: 02/02/24  7:14 PM   Specimen: Stool  Result Value Ref Range Status   Campylobacter species NOT DETECTED NOT DETECTED Final   Plesimonas shigelloides NOT DETECTED NOT DETECTED Final   Salmonella species NOT DETECTED NOT DETECTED Final   Yersinia enterocolitica NOT DETECTED NOT DETECTED Final   Vibrio species NOT DETECTED NOT DETECTED Final   Vibrio cholerae NOT DETECTED NOT DETECTED Final   Enteroaggregative E coli (EAEC) NOT DETECTED NOT DETECTED Final   Enteropathogenic E coli (EPEC) NOT DETECTED NOT DETECTED Final   Enterotoxigenic E coli (ETEC) NOT DETECTED NOT DETECTED Final  Shiga like toxin producing E coli (STEC) NOT DETECTED NOT DETECTED Final   Shigella/Enteroinvasive E coli (EIEC) NOT DETECTED NOT DETECTED Final   Cryptosporidium NOT DETECTED NOT DETECTED Final   Cyclospora cayetanensis NOT DETECTED NOT DETECTED Final   Entamoeba histolytica NOT DETECTED NOT DETECTED Final   Giardia lamblia NOT DETECTED NOT DETECTED Final   Adenovirus F40/41 NOT DETECTED NOT DETECTED Final   Astrovirus NOT DETECTED NOT DETECTED Final   Norovirus GI/GII NOT DETECTED NOT DETECTED Final   Rotavirus A NOT DETECTED NOT DETECTED Final   Sapovirus (I, II, IV, and V) NOT DETECTED NOT DETECTED Final    Comment: Performed at [facility], [address], [place], [place] [address]     Radiology Studies: No results found.   Scheduled Meds:  enoxaparin  (LOVENOX ) injection  30 mg Subcutaneous QHS   polyethylene glycol  17 g Oral BID   Continuous Infusions:  piperacillin -tazobactam (ZOSYN )  IV 3.375 g (02/04/24 0216)     LOS: 2 days   Ivonne Mustache, MD [facility] P9/09/2023,  11:21 AM

## 2024-02-04 NOTE — Progress Notes (Addendum)
 Subjective: No complaints.  Objective: Vital signs in last 24 hours: Temp:  [98.1 F (36.7 C)-98.8 F (37.1 C)] 98.8 F (37.1 C) (09/04 2049) Pulse Rate:  [53-135] 60 (09/04 2049) Resp:  [11-21] 16 (09/04 2049) BP: (111-147)/(54-93) 115/69 (09/04 2049) SpO2:  [100 %] 100 % (09/04 2049) Weight:  [45.4 kg] 45.4 kg (09/04 1248) Last BM Date : 02/03/24  Intake/Output from previous day: 09/04 0701 - 09/05 0700 In: 370 [P.O.:120; I.V.:250] Out: -  Intake/Output this shift: No intake/output data recorded.  General appearance: alert and no distress GI: soft, non-tender; bowel sounds normal; no masses,  no organomegaly  Lab Results: Recent Labs    02/01/24 2338 02/03/24 0533  WBC 9.1 6.9  HGB 11.1* 9.4*  HCT 33.0* 29.7*  PLT 326 293   BMET Recent Labs    02/01/24 2338 02/03/24 0533  NA 142 137  K 3.4* 3.5  CL 107 107  CO2 23 19*  GLUCOSE 92 81  BUN 9 <5*  CREATININE 0.67 0.51  CALCIUM 9.1 8.4*   LFT Recent Labs    02/01/24 2338  PROT 7.4  ALBUMIN 4.1  AST 12*  ALT <5  ALKPHOS 91  BILITOT 0.5   PT/INR No results for input(s): LABPROT, INR in the last 72 hours. Hepatitis Panel No results for input(s): HEPBSAG, HCVAB, HEPAIGM, HEPBIGM in the last 72 hours. C-Diff No results for input(s): CDIFFTOX in the last 72 hours. Fecal Lactopherrin No results for input(s): FECLLACTOFRN in the last 72 hours.  Studies/Results: No results found.  Medications: Scheduled:  enoxaparin  (LOVENOX ) injection  30 mg Subcutaneous QHS   polyethylene glycol  17 g Oral BID   Continuous:  piperacillin -tazobactam (ZOSYN )  IV 3.375 g (02/04/24 0216)    Assessment/Plan: 1) Near obstructing rectal mass.  Consistent with malignancy. 2) Constipation.   The patient is well clinically.  She did not report any complications from yesterday's FFS.  She will maintain her Miralax  BID.  Plan: 1) No further GI evaluation. 2) Oncology and Surgery follow up. 3) Repeat  the colonoscopy in 1 year. 4) Signing off.  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  LOS: 2 days   Lewin Pellow D 02/04/2024, 9:40 AM

## 2024-02-05 DIAGNOSIS — R109 Unspecified abdominal pain: Secondary | ICD-10-CM | POA: Diagnosis not present

## 2024-02-05 NOTE — Plan of Care (Signed)

## 2024-02-05 NOTE — Progress Notes (Signed)
 PROGRESS NOTE  Amy Scott  FMW:[id number] DOB: Mar 19, 1991 DOA: 02/02/2024 PCP: Patient, No Pcp Per   Brief Narrative: Amy Scott is a 33 y.o. female with no significant past medical history presented to med Center at [facility] with complaint of right flank pain for 2 weeks. CT renal stone study showed circumferential bowel wall thickening and extensive perirectal inflammatory stranding involving the mid and high rectum with extensive inflammatory changes extending into the presacral space.  Findings suspicious for inflammatory bowel disease.  Started on antibiotic.  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  General surgery following. Underwent  staging workup with MRI of the pelvis and CT chest/abdomen/pelvis.  Oncology already arranged outpatient follow-up.  Assessment & Plan:  Principal Problem:   Abdominal pain Active Problems:   Normocytic anemia   Abdominal pain/rectal cancer: No significant past medical history.  Report of right flank pain since last 2 weeks, progressively worse.  No report of fever or gross bloody bowel movements.  Had seen some spots of fresh blood mixed with the stool. Imaging showed circumferential bowel wall thickening and extensive perirectal inflammatory stranding involving the mid and high rectum with extensive inflammatory changes extending into the presacral space. Also showed nodular soft tissue within the colorectal mesentery at this level which may reflect inflammatory soft tissue/phlegmon.  Initially suspected inflammatory bowel disease  Started on Zosyn .  Negative  GI pathogen panel. 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  General surgery following.   Oncology already arranged outpatient follow-up. CT chest, abdomen, pelvis/MRI pelvis showed  u.  Lcerated  circumferential mass involving the distal rectosigmoid colon, sub-centimeter perirectal and presacral lymph nodes concerning for nodal metastases.  No distant metastasis.  Will follow-up with general surgery about the next plan.  Partial large bowel obstruction: Partial large bowel obstruction with moderate volume stool seen throughout the more proximal colon.  This is secondary to malignant  mass.  Continue MiraLAX  twice daily.  Having regular bowel movement now.  Hypokalemia: Supplemented with potassium and corrected        DVT prophylaxis:enoxaparin  (LOVENOX ) injection 30 mg Start: 02/02/24 2200     Code Status: Prior  Family Communication: Family members at bedside on 9/5.  Discussed on phone on 9/6  Patient status:Inpatient  Patient is from :Home  Anticipated discharge un:Ynfz  Estimated DC date:after full work up   Consultants: GI, general surgery  Procedures:None  Antimicrobials:  Anti-infectives (From admission, onward)    Start     Dose/Rate Route Frequency Ordered Stop   02/02/24 1030  piperacillin -tazobactam (ZOSYN ) IVPB 3.375 g        3.375 g 12.5 mL/hr over 240 Minutes Intravenous Every 8 hours 02/02/24 1020     02/02/24 0400  piperacillin -tazobactam (ZOSYN ) IVPB 3.375 g        3.375 g 100 mL/hr over 30 Minutes Intravenous  Once 02/02/24 0346 02/02/24 0455       Subjective: Patient seen and examined at bedside today.  Appears comfortable.  Having regular liquidy bowel movements.  Denies any significant abdominal pain today.  No nausea or vomiting.  Objective: Vitals:   02/04/24 2233 02/05/24 0606 02/05/24 0900 02/05/24 1303  BP: 111/65 106/67  (!) 142/81  Pulse: 62 (!) 55  (!) 55  Resp: 16 16  16   Temp: 98.3 F (36.8 C) 98.2 F (36.8 C)  98.4 F (36.9 C)  TempSrc:  SpO2: 100% 100%  100%  Weight:   49.7 kg   Height:        Intake/Output Summary (Last 24 hours) at 02/05/2024 1359 Last data filed at 02/05/2024 9391 Gross per 24 hour  Intake  285.11 ml  Output --  Net 285.11 ml   Filed Weights   02/01/24 2334 02/03/24 1248 02/05/24 0900  Weight: 45.4 kg 45.4 kg 49.7 kg    Examination:  General exam: Overall comfortable, not in distress HEENT: PERRL Respiratory system:  no wheezes or crackles  Cardiovascular system: S1 & S2 heard, RRR.  Gastrointestinal system: Abdomen is nondistended, soft and nontender. Central nervous system: Alert and oriented Extremities: No edema, no clubbing ,no cyanosis Skin: No rashes, no ulcers,no icterus    Data Reviewed: I have personally reviewed following labs and imaging studies  CBC: Recent Labs  Lab 02/01/24 2338 02/03/24 0533  WBC 9.1 6.9  NEUTROABS 5.9  --   HGB 11.1* 9.4*  HCT 33.0* 29.7*  MCV 82.9 84.9  PLT 326 293   Basic Metabolic Panel: Recent Labs  Lab 02/01/24 2338 02/03/24 0533  NA 142 137  K 3.4* 3.5  CL 107 107  CO2 23 19*  GLUCOSE 92 81  BUN 9 <5*  CREATININE 0.67 0.51  CALCIUM 9.1 8.4*     Recent Results (from the past 240 hours)  Blood culture (routine x 2)     Status: None (Preliminary result)   Collection Time: 02/02/24  3:55 AM   Specimen: BLOOD  Result Value Ref Range Status   Specimen Description   Final    BLOOD RIGHT ANTECUBITAL Performed at [facility], [address] [place], [place] [address]    Special Requests   Final    BOTTLES DRAWN AEROBIC AND ANAEROBIC Blood Culture adequate volume Performed at [facility], [address] [place], [place] [address]    Culture   Final    NO GROWTH 3 DAYS Performed at [facility], [address], [place], [place] [address]    Report Status PENDING  Incomplete  Blood culture (routine x 2)     Status: None (Preliminary result)   Collection Time: 02/02/24  4:00 AM   Specimen: BLOOD  Result Value Ref Range Status   Specimen Description   Final    BLOOD LEFT ANTECUBITAL Performed at [facility], [address] [place], [place] [address]     Special Requests   Final    BOTTLES DRAWN AEROBIC AND ANAEROBIC Blood Culture adequate volume Performed at [facility], [address] [place], [place] [address]    Culture   Final    NO GROWTH 3 DAYS Performed at [facility], [address] [place], [place] [address]    Report Status PENDING  Incomplete  Gastrointestinal Panel by PCR , Stool     Status: None   Collection Time: 02/02/24  7:14 PM   Specimen: Stool  Result Value Ref Range Status   Campylobacter species NOT DETECTED NOT DETECTED Final   Plesimonas shigelloides NOT DETECTED NOT DETECTED Final   Salmonella species NOT DETECTED NOT DETECTED Final   Yersinia enterocolitica NOT DETECTED NOT DETECTED Final   Vibrio species NOT DETECTED NOT DETECTED Final   Vibrio cholerae NOT DETECTED NOT DETECTED Final   Enteroaggregative E coli (EAEC) NOT DETECTED NOT DETECTED Final   Enteropathogenic E coli (EPEC) NOT DETECTED NOT DETECTED Final  Enterotoxigenic E coli (ETEC) NOT DETECTED NOT DETECTED Final   Shiga like toxin producing E coli (STEC) NOT DETECTED NOT DETECTED Final   Shigella/Enteroinvasive E coli (EIEC) NOT DETECTED NOT DETECTED Final   Cryptosporidium NOT DETECTED NOT DETECTED Final   Cyclospora cayetanensis NOT DETECTED NOT DETECTED Final   Entamoeba histolytica NOT DETECTED NOT DETECTED Final   Giardia lamblia NOT DETECTED NOT DETECTED Final   Adenovirus F40/41 NOT DETECTED NOT DETECTED Final   Astrovirus NOT DETECTED NOT DETECTED Final   Norovirus GI/GII NOT DETECTED NOT DETECTED Final   Rotavirus A NOT DETECTED NOT DETECTED Final   Sapovirus (I, II, IV, and V) NOT DETECTED NOT DETECTED Final    Comment: Performed at [facility], [address], [place], [place] [address]     Radiology Studies: CT CHEST ABDOMEN PELVIS W CONTRAST Result Date: 02/04/2024 CLINICAL DATA:  Rectal cancer, staging EXAM: CT CHEST, ABDOMEN, AND PELVIS WITH CONTRAST TECHNIQUE: Multidetector CT imaging of the  chest, abdomen and pelvis was performed following the standard protocol during bolus administration of intravenous contrast. RADIATION DOSE REDUCTION: This exam was performed according to the departmental dose-optimization program which includes automated exposure control, adjustment of the mA and/or kV according to patient size and/or use of iterative reconstruction technique. CONTRAST:  85mL OMNIPAQUE  IOHEXOL  300 MG/ML  SOLN COMPARISON:  02/04/2024, 02/02/2024 FINDINGS: CT CHEST FINDINGS Cardiovascular: The heart is unremarkable without pericardial effusion. No evidence of thoracic aortic aneurysm or dissection. Mediastinum/Nodes: No enlarged mediastinal, hilar, or axillary lymph nodes. Thyroid gland, trachea, and esophagus demonstrate no significant findings. Lungs/Pleura: No acute airspace disease, effusion, or pneumothorax. Central airways are patent. Musculoskeletal: No acute or destructive bony abnormalities. Reconstructed images demonstrate no additional findings. CT ABDOMEN PELVIS FINDINGS Hepatobiliary: Subcentimeter subcapsular right lobe liver cyst image 55/2. No other focal liver abnormalities. No biliary duct dilation. The gallbladder is unremarkable. Pancreas: Unremarkable. No pancreatic ductal dilatation or surrounding inflammatory changes. Spleen: Normal in size without focal abnormality. Adrenals/Urinary Tract: The kidneys enhance normally and symmetrically. No urinary tract calculi or obstruction. The adrenals and bladder are unremarkable. Stomach/Bowel: There is circumferential mural thickening within the distal colon at the rectosigmoid junction, corresponding to the known malignancy. There is a 1.3 x 1.8 x 1.2 cm intramural fluid collection reference image 101/2, which may reflect ulceration within the circumferential mass. No evidence of perforation. No bowel obstruction or ileus. Vascular/Lymphatic: Subcentimeter perirectal and presacral lymph nodes are again identified, measuring 7 mm image  103/2 and 6 mm image 108/2. No pathologically enlarged lymph nodes are observed. No significant vascular findings. Reproductive: Uterus and bilateral adnexa are unremarkable. Other: Trace pelvic free fluid. No free intraperitoneal gas. No abdominal wall hernia. Musculoskeletal: No acute or destructive bony abnormalities. Reconstructed images demonstrate no additional findings. IMPRESSION: 1. Ulcerated circumferential mass involving the distal rectosigmoid colon, corresponding to biopsy-proven malignancy. 2. Subcentimeter perirectal and presacral lymph nodes concerning for nodal metastases. No pathologic adenopathy elsewhere within the abdomen or pelvis. 3. No evidence of distant metastases. Electronically Signed   By: Ozell Daring M.D.   On: 02/04/2024 20:18   MR PELVIS WO CONTRAST Result Date: 02/04/2024 CLINICAL DATA:  Rectosigmoid mass on CT EXAM: MRI PELVIS WITHOUT CONTRAST TECHNIQUE: Multiplanar multisequence MR imaging of the pelvis was performed. No intravenous contrast was administered. COMPARISON:  CT 02/02/2024 FINDINGS: The patient was unable to tolerate rectal gel and therefore decision was made to hold the exam and repeat when patient is more clinically stable (as an outpatient). Limited T2 and  diffusion-weighted imaging was performed. Urinary Tract:  Normal urinary bladder.  No distal hydroureter Bowel: The rectosigmoid junction primary is identified including on 14/3. Suboptimally evaluated secondary to underdistention. There is likely transmural extension into the right mesorectum and sigmoid mesocolon including on 13/3. Vascular/Lymphatic: No pelvic aneurysm. Perirectal adenopathy including 5 mm perirectal node of on 17/3. A presacral node measures 6 mm on 12/03. Reproductive:  Normal uterus and adnexa. Other:  Trace free pelvic fluid is likely physiologic. Musculoskeletal: No acute osseous abnormality. IMPRESSION: 1. The exam was halted because patient could not tolerate rectal gel insertion. A  limited exam was performed as detailed above. 2. Rectosigmoid primary, suboptimally evaluated. Probable transmural spread into the right mesorectum and sigmoid mesocolon. 3. Pelvic adenopathy, highly suspicious for nodal metastasis. 4. When the patient is clinically stable (i.e. As an outpatient), dedicated staging MRI should be considered. Electronically Signed   By: Rockey Kilts M.D.   On: 02/04/2024 16:03     Scheduled Meds:  enoxaparin  (LOVENOX ) injection  30 mg Subcutaneous QHS   polyethylene glycol  17 g Oral BID   Continuous Infusions:  piperacillin -tazobactam (ZOSYN )  IV 3.375 g (02/05/24 9391)     LOS: 3 days   Ivonne Mustache, MD [facility] P9/10/2023, 1:59 PM

## 2024-02-05 NOTE — Progress Notes (Signed)
 2 Days Post-Op   Subjective/Chief Complaint: No complaints   Objective: Vital signs in last 24 hours: Temp:  [98.2 F (36.8 C)-98.3 F (36.8 C)] 98.2 F (36.8 C) (09/06 0606) Pulse Rate:  [55-62] 55 (09/06 0606) Resp:  [16] 16 (09/06 0606) BP: (106-111)/(65-67) 106/67 (09/06 0606) SpO2:  [100 %] 100 % (09/06 0606) Last BM Date : 02/03/24  Intake/Output from previous day: 09/05 0701 - 09/06 0700 In: 285.1 [IV Piggyback:285.1] Out: -  Intake/Output this shift: No intake/output data recorded.  General appearance: alert and cooperative Resp: clear to auscultation bilaterally Cardio: regular rate and rhythm GI: soft, nontender  Lab Results:  Recent Labs    02/03/24 0533  WBC 6.9  HGB 9.4*  HCT 29.7*  PLT 293   BMET Recent Labs    02/03/24 0533  NA 137  K 3.5  CL 107  CO2 19*  GLUCOSE 81  BUN <5*  CREATININE 0.51  CALCIUM 8.4*   PT/INR No results for input(s): LABPROT, INR in the last 72 hours. ABG No results for input(s): PHART, HCO3 in the last 72 hours.  Invalid input(s): PCO2, PO2  Studies/Results: CT CHEST ABDOMEN PELVIS W CONTRAST Result Date: 02/04/2024 CLINICAL DATA:  Rectal cancer, staging EXAM: CT CHEST, ABDOMEN, AND PELVIS WITH CONTRAST TECHNIQUE: Multidetector CT imaging of the chest, abdomen and pelvis was performed following the standard protocol during bolus administration of intravenous contrast. RADIATION DOSE REDUCTION: This exam was performed according to the departmental dose-optimization program which includes automated exposure control, adjustment of the mA and/or kV according to patient size and/or use of iterative reconstruction technique. CONTRAST:  85mL OMNIPAQUE  IOHEXOL  300 MG/ML  SOLN COMPARISON:  02/04/2024, 02/02/2024 FINDINGS: CT CHEST FINDINGS Cardiovascular: The heart is unremarkable without pericardial effusion. No evidence of thoracic aortic aneurysm or dissection. Mediastinum/Nodes: No enlarged mediastinal, hilar,  or axillary lymph nodes. Thyroid gland, trachea, and esophagus demonstrate no significant findings. Lungs/Pleura: No acute airspace disease, effusion, or pneumothorax. Central airways are patent. Musculoskeletal: No acute or destructive bony abnormalities. Reconstructed images demonstrate no additional findings. CT ABDOMEN PELVIS FINDINGS Hepatobiliary: Subcentimeter subcapsular right lobe liver cyst image 55/2. No other focal liver abnormalities. No biliary duct dilation. The gallbladder is unremarkable. Pancreas: Unremarkable. No pancreatic ductal dilatation or surrounding inflammatory changes. Spleen: Normal in size without focal abnormality. Adrenals/Urinary Tract: The kidneys enhance normally and symmetrically. No urinary tract calculi or obstruction. The adrenals and bladder are unremarkable. Stomach/Bowel: There is circumferential mural thickening within the distal colon at the rectosigmoid junction, corresponding to the known malignancy. There is a 1.3 x 1.8 x 1.2 cm intramural fluid collection reference image 101/2, which may reflect ulceration within the circumferential mass. No evidence of perforation. No bowel obstruction or ileus. Vascular/Lymphatic: Subcentimeter perirectal and presacral lymph nodes are again identified, measuring 7 mm image 103/2 and 6 mm image 108/2. No pathologically enlarged lymph nodes are observed. No significant vascular findings. Reproductive: Uterus and bilateral adnexa are unremarkable. Other: Trace pelvic free fluid. No free intraperitoneal gas. No abdominal wall hernia. Musculoskeletal: No acute or destructive bony abnormalities. Reconstructed images demonstrate no additional findings. IMPRESSION: 1. Ulcerated circumferential mass involving the distal rectosigmoid colon, corresponding to biopsy-proven malignancy. 2. Subcentimeter perirectal and presacral lymph nodes concerning for nodal metastases. No pathologic adenopathy elsewhere within the abdomen or pelvis. 3. No  evidence of distant metastases. Electronically Signed   By: Ozell Daring M.D.   On: 02/04/2024 20:18   MR PELVIS WO CONTRAST Result Date: 02/04/2024 CLINICAL DATA:  Rectosigmoid mass  on CT EXAM: MRI PELVIS WITHOUT CONTRAST TECHNIQUE: Multiplanar multisequence MR imaging of the pelvis was performed. No intravenous contrast was administered. COMPARISON:  CT 02/02/2024 FINDINGS: The patient was unable to tolerate rectal gel and therefore decision was made to hold the exam and repeat when patient is more clinically stable (as an outpatient). Limited T2 and diffusion-weighted imaging was performed. Urinary Tract:  Normal urinary bladder.  No distal hydroureter Bowel: The rectosigmoid junction primary is identified including on 14/3. Suboptimally evaluated secondary to underdistention. There is likely transmural extension into the right mesorectum and sigmoid mesocolon including on 13/3. Vascular/Lymphatic: No pelvic aneurysm. Perirectal adenopathy including 5 mm perirectal node of on 17/3. A presacral node measures 6 mm on 12/03. Reproductive:  Normal uterus and adnexa. Other:  Trace free pelvic fluid is likely physiologic. Musculoskeletal: No acute osseous abnormality. IMPRESSION: 1. The exam was halted because patient could not tolerate rectal gel insertion. A limited exam was performed as detailed above. 2. Rectosigmoid primary, suboptimally evaluated. Probable transmural spread into the right mesorectum and sigmoid mesocolon. 3. Pelvic adenopathy, highly suspicious for nodal metastasis. 4. When the patient is clinically stable (i.e. As an outpatient), dedicated staging MRI should be considered. Electronically Signed   By: Rockey Kilts M.D.   On: 02/04/2024 16:03    Anti-infectives: Anti-infectives (From admission, onward)    Start     Dose/Rate Route Frequency Ordered Stop   02/02/24 1030  piperacillin -tazobactam (ZOSYN ) IVPB 3.375 g        3.375 g 12.5 mL/hr over 240 Minutes Intravenous Every 8 hours  02/02/24 1020     02/02/24 0400  piperacillin -tazobactam (ZOSYN ) IVPB 3.375 g        3.375 g 100 mL/hr over 30 Minutes Intravenous  Once 02/02/24 0346 02/02/24 0455       Assessment/Plan: s/p Procedure(s) with comments: SIGMOIDOSCOPY, FLEXIBLE (N/A) - rectal bleeding/abnormal CT scan. Continue soft diet with miralax  Staging studies Will likely need neoadjuvant chemo and radiation No surgical issues right now. Will see again next week  LOS: 3 days    Deward Null III 02/05/2024

## 2024-02-06 ENCOUNTER — Encounter (HOSPITAL_COMMUNITY): Payer: Self-pay | Admitting: Gastroenterology

## 2024-02-06 DIAGNOSIS — R109 Unspecified abdominal pain: Secondary | ICD-10-CM | POA: Diagnosis not present

## 2024-02-06 LAB — CBC
HCT: 34.6 % — ABNORMAL LOW (ref 36.0–46.0)
Hemoglobin: 11 g/dL — ABNORMAL LOW (ref 12.0–15.0)
MCH: 26.7 pg (ref 26.0–34.0)
MCHC: 31.8 g/dL (ref 30.0–36.0)
MCV: 84 fL (ref 80.0–100.0)
Platelets: 359 K/uL (ref 150–400)
RBC: 4.12 MIL/uL (ref 3.87–5.11)
RDW: 14.7 % (ref 11.5–15.5)
WBC: 3.6 K/uL — ABNORMAL LOW (ref 4.0–10.5)
nRBC: 0 % (ref 0.0–0.2)

## 2024-02-06 NOTE — Plan of Care (Signed)
  Problem: Education: Goal: Knowledge of General Education information will improve Description: Including pain rating scale, medication(s)/side effects and non-pharmacologic comfort measures Outcome: Progressing   Problem: Nutrition: Goal: Adequate nutrition will be maintained Outcome: Progressing   Problem: Pain Managment: Goal: General experience of comfort will improve and/or be controlled Outcome: Progressing

## 2024-02-06 NOTE — Plan of Care (Signed)
  Problem: Education: Goal: Knowledge of General Education information will improve Description: Including pain rating scale, medication(s)/side effects and non-pharmacologic comfort measures Outcome: Progressing   Problem: Clinical Measurements: Goal: Ability to maintain clinical measurements within normal limits will improve Outcome: Progressing   Problem: Activity: Goal: Risk for activity intolerance will decrease Outcome: Progressing   Problem: Elimination: Goal: Will not experience complications related to bowel motility Outcome: Progressing   Problem: Elimination: Goal: Will not experience complications related to urinary retention Outcome: Progressing

## 2024-02-06 NOTE — Progress Notes (Signed)
 PROGRESS NOTE  Amy Scott  FMW:[id number] DOB: 1990/12/10 DOA: 02/02/2024 PCP: Patient, No Pcp Per   Brief Narrative: Amy Scott is a 33 y.o. female with no significant past medical history presented to med Center at [facility] with complaint of right flank pain for 2 weeks. CT renal stone study showed circumferential bowel wall thickening and extensive perirectal inflammatory stranding involving the mid and high rectum with extensive inflammatory changes extending into the presacral space.  Findings suspicious for inflammatory bowel disease.  Started on antibiotic.  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  General surgery following. Underwent  staging workup with MRI of the pelvis and CT chest/abdomen/pelvis.  Oncology already arranged outpatient follow-up.  Assessment & Plan:  Principal Problem:   Abdominal pain Active Problems:   Normocytic anemia   Abdominal pain/rectal cancer: No significant past medical history.  Report of right flank pain since last 2 weeks, progressively worse.  No report of fever or gross bloody bowel movements.  Had seen some spots of fresh blood mixed with the stool. Imaging showed circumferential bowel wall thickening and extensive perirectal inflammatory stranding involving the mid and high rectum with extensive inflammatory changes extending into the presacral space. Also showed nodular soft tissue within the colorectal mesentery at this level which may reflect inflammatory soft tissue/phlegmon.  Initially suspected inflammatory bowel disease  Started on Zosyn .  Negative  GI pathogen panel. 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  General surgery following.   Oncology already arranged outpatient follow-up. CT chest, abdomen, pelvis/MRI pelvis showed ulcerated circumferential  mass involving the distal rectosigmoid colon, sub-centimeter perirectal and presacral lymph nodes concerning for nodal metastases.  No distant metastasis.  Will follow-up with general surgery about the next plan.Sent message to Dr Teresa, awaiting response  Partial large bowel obstruction: Partial large bowel obstruction with moderate volume stool seen throughout the more proximal colon.  This is secondary to malignant  mass.  Continue MiraLAX  twice daily.  Having regular bowel movement now.  Hypokalemia: Supplemented with potassium and corrected        DVT prophylaxis:enoxaparin  (LOVENOX ) injection 30 mg Start: 02/02/24 2200     Code Status: Prior  Family Communication: Boyfriend at bedside on 9/7  Patient status:Inpatient  Patient is from :Home  Anticipated discharge un:Ynfz  Estimated DC date:after full work up, general surgery clearance   Consultants: GI, general surgery  Procedures: Colonoscopy  Antimicrobials:  Anti-infectives (From admission, onward)    Start     Dose/Rate Route Frequency Ordered Stop   02/02/24 1030  piperacillin -tazobactam (ZOSYN ) IVPB 3.375 g  Status:  Discontinued        3.375 g 12.5 mL/hr over 240 Minutes Intravenous Every 8 hours 02/02/24 1020 02/06/24 0816   02/02/24 0400  piperacillin -tazobactam (ZOSYN ) IVPB 3.375 g        3.375 g 100 mL/hr over 30 Minutes Intravenous  Once 02/02/24 0346 02/02/24 0455       Subjective: Seen and examined at bedside today.  Comfortable.  Lying in bed.  Denying new complaints.  No significant lower abdominal pain.  Having regular bowel movements.  No hematochezia or melena.  Eager to go home.  Objective: Vitals:   02/05/24 0900 02/05/24 1303 02/05/24 2128 02/06/24 0643  BP:  (!) 142/81 123/79 126/79  Pulse:  (!) 55 (!) 56 64  Resp:  16 18 18   Temp:  98.4  F (36.9 C)  98.8 F (37.1 C)  TempSrc:    Oral  SpO2:  100% 100% 100%  Weight: 49.7 kg     Height:        Intake/Output Summary (Last 24  hours) at 02/06/2024 1132 Last data filed at 02/05/2024 2124 Gross per 24 hour  Intake 530.59 ml  Output --  Net 530.59 ml   Filed Weights   02/01/24 2334 02/03/24 1248 02/05/24 0900  Weight: 45.4 kg 45.4 kg 49.7 kg    Examination:   General exam: Overall comfortable, not in distress HEENT: PERRL Respiratory system:  no wheezes or crackles  Cardiovascular system: S1 & S2 heard, RRR.  Gastrointestinal system: Abdomen is nondistended, soft and nontender. Central nervous system: Alert and oriented Extremities: No edema, no clubbing ,no cyanosis Skin: No rashes, no ulcers,no icterus    Data Reviewed: I have personally reviewed following labs and imaging studies  CBC: Recent Labs  Lab 02/01/24 2338 02/03/24 0533 02/06/24 0930  WBC 9.1 6.9 3.6*  NEUTROABS 5.9  --   --   HGB 11.1* 9.4* 11.0*  HCT 33.0* 29.7* 34.6*  MCV 82.9 84.9 84.0  PLT 326 293 359   Basic Metabolic Panel: Recent Labs  Lab 02/01/24 2338 02/03/24 0533  NA 142 137  K 3.4* 3.5  CL 107 107  CO2 23 19*  GLUCOSE 92 81  BUN 9 <5*  CREATININE 0.67 0.51  CALCIUM 9.1 8.4*     Recent Results (from the past 240 hours)  Blood culture (routine x 2)     Status: None (Preliminary result)   Collection Time: 02/02/24  3:55 AM   Specimen: BLOOD  Result Value Ref Range Status   Specimen Description   Final    BLOOD RIGHT ANTECUBITAL Performed at [facility], [address] [place], [place] [address]    Special Requests   Final    BOTTLES DRAWN AEROBIC AND ANAEROBIC Blood Culture adequate volume Performed at [facility], [address] [place], [place] [address]    Culture   Final    NO GROWTH 4 DAYS Performed at [facility], [address] [place], [place] [address]    Report Status PENDING  Incomplete  Blood culture (routine x 2)     Status: None (Preliminary result)   Collection Time: 02/02/24  4:00 AM   Specimen: BLOOD  Result Value Ref Range Status   Specimen  Description   Final    BLOOD LEFT ANTECUBITAL Performed at [facility], [address] [place], [place] [address]    Special Requests   Final    BOTTLES DRAWN AEROBIC AND ANAEROBIC Blood Culture adequate volume Performed at [facility], [address] [place], [place] [address]    Culture   Final    NO GROWTH 4 DAYS Performed at [facility], [address] [place], [place] [address]    Report Status PENDING  Incomplete  Gastrointestinal Panel by PCR , Stool     Status: None   Collection Time: 02/02/24  7:14 PM   Specimen: Stool  Result Value Ref Range Status   Campylobacter species NOT DETECTED NOT DETECTED Final   Plesimonas shigelloides NOT DETECTED NOT DETECTED Final   Salmonella species NOT DETECTED NOT DETECTED Final   Yersinia enterocolitica NOT DETECTED NOT DETECTED Final   Vibrio species NOT DETECTED NOT DETECTED Final   Vibrio cholerae NOT DETECTED NOT  DETECTED Final   Enteroaggregative E coli (EAEC) NOT DETECTED NOT DETECTED Final   Enteropathogenic E coli (EPEC) NOT DETECTED NOT DETECTED Final   Enterotoxigenic E coli (ETEC) NOT DETECTED NOT DETECTED Final   Shiga like toxin producing E coli (STEC) NOT DETECTED NOT DETECTED Final   Shigella/Enteroinvasive E coli (EIEC) NOT DETECTED NOT DETECTED Final   Cryptosporidium NOT DETECTED NOT DETECTED Final   Cyclospora cayetanensis NOT DETECTED NOT DETECTED Final   Entamoeba histolytica NOT DETECTED NOT DETECTED Final   Giardia lamblia NOT DETECTED NOT DETECTED Final   Adenovirus F40/41 NOT DETECTED NOT DETECTED Final   Astrovirus NOT DETECTED NOT DETECTED Final   Norovirus GI/GII NOT DETECTED NOT DETECTED Final   Rotavirus A NOT DETECTED NOT DETECTED Final   Sapovirus (I, II, IV, and V) NOT DETECTED NOT DETECTED Final    Comment: Performed at [facility], [address] [place], [place] [address]     Radiology Studies: CT CHEST ABDOMEN PELVIS W CONTRAST Result Date:  02/04/2024 CLINICAL DATA:  Rectal cancer, staging EXAM: CT CHEST, ABDOMEN, AND PELVIS WITH CONTRAST TECHNIQUE: Multidetector CT imaging of the chest, abdomen and pelvis was performed following the standard protocol during bolus administration of intravenous contrast. RADIATION DOSE REDUCTION: This exam was performed according to the departmental dose-optimization program which includes automated exposure control, adjustment of the mA and/or kV according to patient size and/or use of iterative reconstruction technique. CONTRAST:  85mL OMNIPAQUE  IOHEXOL  300 MG/ML  SOLN COMPARISON:  02/04/2024, 02/02/2024 FINDINGS: CT CHEST FINDINGS Cardiovascular: The heart is unremarkable without pericardial effusion. No evidence of thoracic aortic aneurysm or dissection. Mediastinum/Nodes: No enlarged mediastinal, hilar, or axillary lymph nodes. Thyroid gland, trachea, and esophagus demonstrate no significant findings. Lungs/Pleura: No acute airspace disease, effusion, or pneumothorax. Central airways are patent. Musculoskeletal: No acute or destructive bony abnormalities. Reconstructed images demonstrate no additional findings. CT ABDOMEN PELVIS FINDINGS Hepatobiliary: Subcentimeter subcapsular right lobe liver cyst image 55/2. No other focal liver abnormalities. No biliary duct dilation. The gallbladder is unremarkable. Pancreas: Unremarkable. No pancreatic ductal dilatation or surrounding inflammatory changes. Spleen: Normal in size without focal abnormality. Adrenals/Urinary Tract: The kidneys enhance normally and symmetrically. No urinary tract calculi or obstruction. The adrenals and bladder are unremarkable. Stomach/Bowel: There is circumferential mural thickening within the distal colon at the rectosigmoid junction, corresponding to the known malignancy. There is a 1.3 x 1.8 x 1.2 cm intramural fluid collection reference image 101/2, which may reflect ulceration within the circumferential mass. No evidence of perforation. No  bowel obstruction or ileus. Vascular/Lymphatic: Subcentimeter perirectal and presacral lymph nodes are again identified, measuring 7 mm image 103/2 and 6 mm image 108/2. No pathologically enlarged lymph nodes are observed. No significant vascular findings. Reproductive: Uterus and bilateral adnexa are unremarkable. Other: Trace pelvic free fluid. No free intraperitoneal gas. No abdominal wall hernia. Musculoskeletal: No acute or destructive bony abnormalities. Reconstructed images demonstrate no additional findings. IMPRESSION: 1. Ulcerated circumferential mass involving the distal rectosigmoid colon, corresponding to biopsy-proven malignancy. 2. Subcentimeter perirectal and presacral lymph nodes concerning for nodal metastases. No pathologic adenopathy elsewhere within the abdomen or pelvis. 3. No evidence of distant metastases. Electronically Signed   By: Ozell Daring M.D.   On: 02/04/2024 20:18   MR PELVIS WO CONTRAST Result Date: 02/04/2024 CLINICAL DATA:  Rectosigmoid mass on CT EXAM: MRI PELVIS WITHOUT CONTRAST TECHNIQUE: Multiplanar multisequence MR imaging of the pelvis was performed. No intravenous contrast was administered. COMPARISON:  CT 02/02/2024 FINDINGS: The patient was unable to  tolerate rectal gel and therefore decision was made to hold the exam and repeat when patient is more clinically stable (as an outpatient). Limited T2 and diffusion-weighted imaging was performed. Urinary Tract:  Normal urinary bladder.  No distal hydroureter Bowel: The rectosigmoid junction primary is identified including on 14/3. Suboptimally evaluated secondary to underdistention. There is likely transmural extension into the right mesorectum and sigmoid mesocolon including on 13/3. Vascular/Lymphatic: No pelvic aneurysm. Perirectal adenopathy including 5 mm perirectal node of on 17/3. A presacral node measures 6 mm on 12/03. Reproductive:  Normal uterus and adnexa. Other:  Trace free pelvic fluid is likely  physiologic. Musculoskeletal: No acute osseous abnormality. IMPRESSION: 1. The exam was halted because patient could not tolerate rectal gel insertion. A limited exam was performed as detailed above. 2. Rectosigmoid primary, suboptimally evaluated. Probable transmural spread into the right mesorectum and sigmoid mesocolon. 3. Pelvic adenopathy, highly suspicious for nodal metastasis. 4. When the patient is clinically stable (i.e. As an outpatient), dedicated staging MRI should be considered. Electronically Signed   By: Rockey Kilts M.D.   On: 02/04/2024 16:03     Scheduled Meds:  enoxaparin  (LOVENOX ) injection  30 mg Subcutaneous QHS   polyethylene glycol  17 g Oral BID   Continuous Infusions:     LOS: 4 days   Ivonne Mustache, MD [facility] P9/11/2023, 11:32 AM

## 2024-02-07 ENCOUNTER — Encounter: Payer: Self-pay | Admitting: *Deleted

## 2024-02-07 ENCOUNTER — Ambulatory Visit
Admission: RE | Admit: 2024-02-07 | Discharge: 2024-02-07 | Disposition: A | Discharge status: Home / Self Care | Source: Ambulatory Visit | Attending: Radiation Oncology | Admitting: Radiation Oncology

## 2024-02-07 ENCOUNTER — Other Ambulatory Visit: Payer: Self-pay | Admitting: Radiation Oncology

## 2024-02-07 ENCOUNTER — Other Ambulatory Visit (HOSPITAL_COMMUNITY): Payer: Self-pay

## 2024-02-07 DIAGNOSIS — C20 Malignant neoplasm of rectum: Secondary | ICD-10-CM

## 2024-02-07 DIAGNOSIS — R109 Unspecified abdominal pain: Secondary | ICD-10-CM | POA: Diagnosis not present

## 2024-02-07 DIAGNOSIS — C19 Malignant neoplasm of rectosigmoid junction: Secondary | ICD-10-CM | POA: Insufficient documentation

## 2024-02-07 LAB — CULTURE, BLOOD (ROUTINE X 2)
Culture: NO GROWTH
Culture: NO GROWTH
Special Requests: ADEQUATE
Special Requests: ADEQUATE

## 2024-02-07 LAB — SURGICAL PATHOLOGY

## 2024-02-07 MED ORDER — ENSURE PLUS HIGH PROTEIN PO LIQD: 237.0000 mL | Freq: Two times a day (BID) | ORAL | Status: DC

## 2024-02-07 MED ORDER — ENSURE PLUS HIGH PROTEIN PO LIQD
237.0000 mL | Freq: Two times a day (BID) | ORAL | 0 refills | Status: AC
  Filled 2024-02-07: qty 5000, 11d supply, fill #0

## 2024-02-07 MED ORDER — POLYETHYLENE GLYCOL 3350 17 GM/SCOOP PO POWD
17.0000 g | Freq: Two times a day (BID) | ORAL | 0 refills | Status: DC
  Filled 2024-02-07: qty 238, 7d supply, fill #0

## 2024-02-07 MED ORDER — OXYCODONE HCL 5 MG PO TABS
5.0000 mg | ORAL_TABLET | Freq: Four times a day (QID) | ORAL | 0 refills | Status: DC | PRN
  Filled 2024-02-07: qty 30, 5d supply, fill #0

## 2024-02-07 NOTE — Progress Notes (Signed)
 See inpatient consult note for complete documentation.

## 2024-02-07 NOTE — Consult Note (Addendum)
 Radiation Oncology         [phone] ________________________________  Initial inpatient Consultation  Name: Amy Scott MRN: [id number]  Date of Service: 02/01/2024 DOB: 1990-06-08  RR:[id number], No Pcp Per  No ref. provider found   REFERRING PHYSICIAN: No ref. 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 female seen at the request of Dr. Ivonne.  Amy Scott presented to an ER in [facility] on 02/01/24 with acutely worsening abdominal pain in the setting of a month long history of abdominal and R flank pain. A CT Renal Stone was performed which demonstrated circumferential bowel wall thickening and extensive perirectal inflammatory stranding involving the mid and high rectum w/ extensive inflammatory changes with a resultant partial large bowel obstruction. Extensive perirectal adenopathy was noted.   Due to concern for malignancy and for further evaluation she underwent a flexible sigmoidoscopy w/ Dr. Rollin on 09/04/25who noted a near obstructing rectal mass consistent with malignancy. Biopsy revealed invasive moderately differentiated adenocarcinoma. Subsequent staging scans including an MRI Pelvis and a CT CAP were performed. CT CAP was without evidence of metastatic disease. Specifically no metastatic disease in the thorax or liver. Mass at the rectosigmoid junction noted with pelvic adenopathy. MRI was stopped prematurely due to difficulty tolerating rectal gel but noted a rectosigmoid primary with probable transmural spread into the R mesorectum and sigmoid mesocolon. Pelvic adenopathy again noted and suspicious for nodal metastasis.   She has been evaluated by general surgery. No surgical intervention was performed. Referred for consideration of neoadjuvant therapy. She is on twice daily Miralax  for partial obstruction. She has not yet seen medical oncology.  She is accompanied in the hospital  by her boyfriend and her older sister was on the phone during the consultation. She is ready to proceed with recommended treatments. Sister is concerned about her weight and appetite which are low at baseline. The patient says she prefers nutrition supplementation with clear Ensures.   Today she reports a month long hx of abdominal pain, rectal pressure and rectal bleeding. Also of note is occasional dyspareunia. She denies dysuria or hematuria.   She has four kids and is not planning to have any additional children.   PREVIOUS RADIATION THERAPY: No  AUTOIMMUNE DISEASE: No  MEDICAL DEVICES: No  PREGNANCY: Verbal pregnancy denial   PAST MEDICAL HISTORY:  Past Medical History:  Diagnosis Date   Headache(784.0)    No pertinent past medical history       PAST SURGICAL HISTORY: Past Surgical History:  Procedure Laterality Date   APPENDECTOMY     FLEXIBLE SIGMOIDOSCOPY N/A 02/03/2024   Procedure: KINGSTON SIDE;  Surgeon: Rollin Dover, MD;  Location: [facility];  Service: Gastroenterology;  Laterality: N/A;  rectal bleeding/abnormal CT scan.   MOUTH SURGERY      FAMILY HISTORY: History reviewed. No pertinent family history.  SOCIAL HISTORY:  Social History   Socioeconomic History   Marital status: Single    Spouse name: Not on file   Number of children: Not on file   Years of education: Not on file   Highest education level: Not on file  Occupational History   Not on file  Tobacco Use   Smoking status: Former    Types: Cigarettes   Smokeless tobacco: Never  Vaping Use   Vaping status: Never Used  Substance and Sexual Activity   Alcohol use: No   Drug use: No   Sexual activity: Yes  Other Topics Concern   Not on file  Social History Narrative   Not on file   Social Drivers of Health   Financial Resource Strain: Not on file  Food Insecurity: No Food Insecurity (02/03/2024)   Hunger Vital Sign    Worried About Running Out of Food in the Last Year: Never  true    Ran Out of Food in the Last Year: Never true  Transportation Needs: No Transportation Needs (02/03/2024)   PRAPARE - Administrator, Civil Service (Medical): No    Lack of Transportation (Non-Medical): No  Physical Activity: Not on file  Stress: Not on file  Social Connections: Not on file  Intimate Partner Violence: Not At Risk (02/02/2024)   Humiliation, Afraid, Rape, and Kick questionnaire    Fear of Current or Ex-Partner: No    Emotionally Abused: No    Physically Abused: No    Sexually Abused: No    ALLERGIES: Morphine and codeine  MEDICATIONS:  No current facility-administered medications for this encounter.   Current Outpatient Medications  Medication Sig Dispense Refill   feeding supplement (ENSURE PLUS HIGH PROTEIN) LIQD Take 237 mLs by mouth 2 (two) times daily between meals. 5000 mL 0   oxyCODONE  (OXY IR/ROXICODONE ) 5 MG immediate release tablet Take 1 tablet (5 mg total) by mouth every 6 (six) hours as needed for moderate pain (pain score 4-6). 30 tablet 0   polyethylene glycol powder (GLYCOLAX /MIRALAX ) 17 GM/SCOOP powder Take 17 g dissolved in liquid by mouth 2 (two) times daily. 238 g 0    REVIEW OF SYSTEMS:  On review of systems the patient reports abdominal pain, rectal pressure, rectal pain, blood in her stool, dyspareunia, decreased appetite and weight loss. Denies N/V (except when preparing for sigmoidoscopy, could not tolerate prep), dysuria and hematuria.      PHYSICAL EXAM:  Wt Readings from Last 3 Encounters:  02/05/24 109 lb 9.1 oz (49.7 kg)  01/31/21 110 lb (49.9 kg)  04/07/13 120 lb 8 oz (54.7 kg)   Temp Readings from Last 3 Encounters:  02/07/24 98.7 F (37.1 C) (Oral)  01/31/21 98.6 F (37 C)  06/24/13 98.3 F (36.8 C) (Oral)   BP Readings from Last 3 Encounters:  02/07/24 130/77  01/31/21 118/65  06/24/13 111/68   Pulse Readings from Last 3 Encounters:  02/07/24 (!) 56  01/31/21 (!) 58  06/24/13 70   Pain  Assessment Pain Score: Asleep/10  ECOG: 1 General: Thin appearing female. Alert and oriented, in no acute distress.   HEENT: Head is normocephalic. Extraocular movements are intact.  Neck: Neck is supple Heart: No extremity edema Chest: No respiratory distress Abdomen: Non-distended, tender to palpation throughout lower abdomen/suprapubic region, most significantly in the RLQ Extremities: No cyanosis or edema. Skin: no open wounds or suspicious lesions Musculoskeletal: symmetric movement  Neurologic: Cranial nerves II through XII are grossly intact. No obvious focalities. Speech is fluent.  Psychiatric: Judgment and insight are intact. Affect is appropriate.   LABORATORY DATA:  Lab Results  Component Value Date   WBC 3.6 (L) 02/06/2024   HGB 11.0 (L) 02/06/2024   HCT 34.6 (L) 02/06/2024   MCV 84.0 02/06/2024   PLT 359 02/06/2024   Lab Results  Component Value Date   NA 137 02/03/2024   K 3.5 02/03/2024   CL 107 02/03/2024   CO2 19 (L) 02/03/2024   Lab Results  Component Value Date   ALT <5 02/01/2024   AST 12 (L) 02/01/2024  ALKPHOS 91 02/01/2024   BILITOT 0.5 02/01/2024     RADIOGRAPHY: CT CHEST ABDOMEN PELVIS W CONTRAST Result Date: 02/04/2024 CLINICAL DATA:  Rectal cancer, staging EXAM: CT CHEST, ABDOMEN, AND PELVIS WITH CONTRAST TECHNIQUE: Multidetector CT imaging of the chest, abdomen and pelvis was performed following the standard protocol during bolus administration of intravenous contrast. RADIATION DOSE REDUCTION: This exam was performed according to the departmental dose-optimization program which includes automated exposure control, adjustment of the mA and/or kV according to patient size and/or use of iterative reconstruction technique. CONTRAST:  85mL OMNIPAQUE  IOHEXOL  300 MG/ML  SOLN COMPARISON:  02/04/2024, 02/02/2024 FINDINGS: CT CHEST FINDINGS Cardiovascular: The heart is unremarkable without pericardial effusion. No evidence of thoracic aortic aneurysm or  dissection. Mediastinum/Nodes: No enlarged mediastinal, hilar, or axillary lymph nodes. Thyroid gland, trachea, and esophagus demonstrate no significant findings. Lungs/Pleura: No acute airspace disease, effusion, or pneumothorax. Central airways are patent. Musculoskeletal: No acute or destructive bony abnormalities. Reconstructed images demonstrate no additional findings. CT ABDOMEN PELVIS FINDINGS Hepatobiliary: Subcentimeter subcapsular right lobe liver cyst image 55/2. No other focal liver abnormalities. No biliary duct dilation. The gallbladder is unremarkable. Pancreas: Unremarkable. No pancreatic ductal dilatation or surrounding inflammatory changes. Spleen: Normal in size without focal abnormality. Adrenals/Urinary Tract: The kidneys enhance normally and symmetrically. No urinary tract calculi or obstruction. The adrenals and bladder are unremarkable. Stomach/Bowel: There is circumferential mural thickening within the distal colon at the rectosigmoid junction, corresponding to the known malignancy. There is a 1.3 x 1.8 x 1.2 cm intramural fluid collection reference image 101/2, which may reflect ulceration within the circumferential mass. No evidence of perforation. No bowel obstruction or ileus. Vascular/Lymphatic: Subcentimeter perirectal and presacral lymph nodes are again identified, measuring 7 mm image 103/2 and 6 mm image 108/2. No pathologically enlarged lymph nodes are observed. No significant vascular findings. Reproductive: Uterus and bilateral adnexa are unremarkable. Other: Trace pelvic free fluid. No free intraperitoneal gas. No abdominal wall hernia. Musculoskeletal: No acute or destructive bony abnormalities. Reconstructed images demonstrate no additional findings. IMPRESSION: 1. Ulcerated circumferential mass involving the distal rectosigmoid colon, corresponding to biopsy-proven malignancy. 2. Subcentimeter perirectal and presacral lymph nodes concerning for nodal metastases. No pathologic  adenopathy elsewhere within the abdomen or pelvis. 3. No evidence of distant metastases. Electronically Signed   By: Ozell Daring M.D.   On: 02/04/2024 20:18   MR PELVIS WO CONTRAST Result Date: 02/04/2024 CLINICAL DATA:  Rectosigmoid mass on CT EXAM: MRI PELVIS WITHOUT CONTRAST TECHNIQUE: Multiplanar multisequence MR imaging of the pelvis was performed. No intravenous contrast was administered. COMPARISON:  CT 02/02/2024 FINDINGS: The patient was unable to tolerate rectal gel and therefore decision was made to hold the exam and repeat when patient is more clinically stable (as an outpatient). Limited T2 and diffusion-weighted imaging was performed. Urinary Tract:  Normal urinary bladder.  No distal hydroureter Bowel: The rectosigmoid junction primary is identified including on 14/3. Suboptimally evaluated secondary to underdistention. There is likely transmural extension into the right mesorectum and sigmoid mesocolon including on 13/3. Vascular/Lymphatic: No pelvic aneurysm. Perirectal adenopathy including 5 mm perirectal node of on 17/3. A presacral node measures 6 mm on 12/03. Reproductive:  Normal uterus and adnexa. Other:  Trace free pelvic fluid is likely physiologic. Musculoskeletal: No acute osseous abnormality. IMPRESSION: 1. The exam was halted because patient could not tolerate rectal gel insertion. A limited exam was performed as detailed above. 2. Rectosigmoid primary, suboptimally evaluated. Probable transmural spread into the right mesorectum and sigmoid mesocolon. 3. Pelvic adenopathy,  highly suspicious for nodal metastasis. 4. When the patient is clinically stable (i.e. As an outpatient), dedicated staging MRI should be considered. Electronically Signed   By: Rockey Kilts M.D.   On: 02/04/2024 16:03   CT Renal Stone Study Result Date: 02/02/2024 CLINICAL DATA:  Right lower quadrant abdominal pain, history of appendectomy EXAM: CT ABDOMEN AND PELVIS WITHOUT CONTRAST TECHNIQUE: Multidetector CT  imaging of the abdomen and pelvis was performed following the standard protocol without IV contrast. RADIATION DOSE REDUCTION: This exam was performed according to the departmental dose-optimization program which includes automated exposure control, adjustment of the mA and/or kV according to patient size and/or use of iterative reconstruction technique. COMPARISON:  None Available. FINDINGS: Lower chest: No acute abnormality. Hepatobiliary: No focal liver abnormality is seen. No gallstones, gallbladder wall thickening, or biliary dilatation. Pancreas: Unremarkable Spleen: Unremarkable Adrenals/Urinary Tract: The adrenal glands are unremarkable. Fine calcifications are seen within the kidneys bilaterally in keeping medullary nephrocalcinosis. No urinary renal or ureteral calculi are identified. No hydronephrosis. No perinephric inflammatory stranding or fluid collections are seen. Bladder is unremarkable Stomach/Bowel: There is circumferential bowel wall thickening and extensive perirectal inflammatory stranding involving the mid and high rectum with extensive inflammatory changes extending into the presacral space, best appreciated on image # 57/2 and 70/7. There is suggestion of nodular soft tissue within the colorectal mesentery at this level (102/5) which may reflect inflammatory soft tissue/phlegmon. This may be infectious or inflammatory in nature as can be seen with inflammatory bowel disease. However, an infiltrative mass with transmural extension could appear similarly. Extensive perirectal adenopathy is present, possibly reactive though infiltrative disease could appear similarly. There is a resultant partial large bowel obstruction with moderate volume stool seen throughout the more proximal colon. The stomach, small bowel, and large bowel are otherwise unremarkable. Appendix has been removed. No free intraperitoneal gas or fluid Vascular/Lymphatic: The left gonadal vein appears dilated but is not well  assessed on this noncontrast examination. The abdominal vasculature is otherwise unremarkable. No additional pathologic adenopathy identified within the abdomen and pelvis. Reproductive: Uterus and bilateral adnexa are unremarkable. Other: No abdominal wall hernia Musculoskeletal: No acute or significant osseous findings. IMPRESSION: 1. Circumferential bowel wall thickening and extensive perirectal inflammatory stranding involving the mid and high rectum with extensive inflammatory changes extending into the presacral space. There is suggestion of nodular soft tissue within the colorectal mesentery at this level which may reflect inflammatory soft tissue/phlegmon. This may be infectious or inflammatory in nature as can be seen with inflammatory bowel disease. However, an infiltrative mass with transmural extension could appear similarly. Correlation with endoscopy is recommended. 2. Extensive perirectal adenopathy, possibly reactive though infiltrative disease could appear similarly. 3. Resultant partial large bowel obstruction with moderate volume stool seen throughout the more proximal colon. 4. Medullary nephrocalcinosis. Differential considerations include systemic hypercalcemia, renal tubular acidosis, or hyperparathyroidism Electronically Signed   By: Dorethia Molt M.D.   On: 02/02/2024 02:49     PATHOLOGY:  Rectal Mass Biopsy 02/03/24:  SURGICAL PATHOLOGY  CASE: [id number]  PATIENT: [name]  Surgical Pathology Report   Clinical History: Rectal bleeding with abnormal CT scan ([facility] )    FINAL MICROSCOPIC DIAGNOSIS:   A. COLON, RECTOSIGMOID, BIOPSY:  - Invasive moderately differentiated adenocarcinoma      IMPRESSION/PLAN: 33 y.o. 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  She was found to have a near-obstructing mass, biopsy  consistent with  adenocarcinoma. Initial staging CT showed no metastatic disease. MRI pelvis was suboptimal for locoregional staging. She appears to be a candidate for total neoadjuvant therapy pending tumor board discussion and final recommendations.  We reviewed the general treatment paradigm of total neoadjvuant therapy, specifically radiation which can include 5.5 weeks of treatment with concurrent chemotherapy. We reviewed the logistics of radiation therapy, including the process of CT simulation, daily treatments and the overall treatment timeline. We also discussed anticipated side effects, including but not limited to fatigue, abdominal pain, nausea, dysuria, urinary frequency, diarrhea, blood in the stool and tenesmus. We also discussed radiation's impact on fertility and offered a referral to discuss fertility preservation which the patient declined. She states she has finished having children.  Plan:  -Repeat pelvic MRI for improved locoregional staging. -Case to be reviewed at multidisciplinary tumor board for consensus recommendations. -Tentative plan for total neoadjuvant therapy pending tumor board discussion -Will proceed with CT simulation scheduling in anticipation of therapy.  -Pain management: Hospitalist will prescribe pain medicine at discharge -Nutritional support: Hospitalist will prescribe prescription for Clear Ensure  -Social work referral for disability assistance and psychosocial support.  Total time spent today in preparation for this visit was 60 minutes. This included patient care, imaging and path review, documentation, multidisciplinary discussion and coordination of care and follow up.    Estefana HERO. Maritza, M.D.

## 2024-02-07 NOTE — Discharge Summary (Signed)
 Physician Discharge Summary  Onna Nodal FMW:[id number] DOB: 04-Apr-1991 DOA: 02/02/2024  PCP: Amy Scott, No Pcp Per  Admit date: 02/02/2024 Discharge date: 02/07/2024  Admitted From: Home Disposition:  Home  Discharge Condition:Stable CODE STATUS:FULL Diet recommendation:  Regular   Brief/Interim Summary: Amy Scott is a 33 y.o. female with no significant past medical history presented to med Center at [facility] with complaint of right flank pain for 2 weeks. CT renal stone study showed circumferential bowel wall thickening and extensive perirectal inflammatory stranding involving the mid and high rectum with extensive inflammatory changes extending into the presacral space.  Findings suspicious for inflammatory bowel disease.  Started on antibiotic.  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  General surgery following. Underwent  staging workup with MRI of the pelvis and CT chest/abdomen/pelvis.  We communicated with oncology, radiation oncology and general surgery.  She will be called for follow-up appointments.  She is medically stable for discharge home today.  Following problems were addressed during the hospitalization:  Abdominal pain/rectal cancer: No significant past medical history.  Report of right flank pain since last 2 weeks, progressively worse.  No report of fever or gross bloody bowel movements.  Had seen some spots of fresh blood mixed with the stool. Imaging showed circumferential bowel wall thickening and extensive perirectal inflammatory stranding involving the mid and high rectum with extensive inflammatory changes extending into the presacral space. Also showed nodular soft tissue within the colorectal mesentery at this level which may reflect inflammatory soft tissue/phlegmon.  Initially suspected inflammatory bowel disease  Started on Zosyn .  Negative  GI  pathogen panel. 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 CT chest, abdomen, pelvis/MRI pelvis showed ulcerated circumferential mass involving the distal rectosigmoid colon, sub-centimeter perirectal and presacral lymph nodes concerning for nodal metastases.  No distant metastasis.  She will be called for appointment by oncology, radiation oncology and general surgery . Continue bowel regimen  partial large bowel obstruction: Resolved.  Having regular bowel movements now  Hypokalemia: Supplemented with potassium and corrected   Discharge Diagnoses:  Principal Problem:   Abdominal pain Active Problems:   Normocytic anemia    Discharge Instructions  Discharge Instructions     Diet general   Complete by: As directed    Discharge instructions   Complete by: As directed    1)Please take your medications as instructed 2)You will be called by oncology, radiation oncology and general surgery for follow-up appointment. 3)Follow up with your PCP in 1 to 2 weeks.   Increase activity slowly   Complete by: As directed       Allergies as of 02/07/2024       Reactions   Morphine And Codeine Itching, Swelling, Hives, Shortness Of Breath        Medication List     TAKE these medications    feeding supplement Liqd Take 237 mLs by mouth 2 (two) times daily between meals.   oxyCODONE  5 MG immediate release tablet Commonly known as: Oxy IR/ROXICODONE  Take 1 tablet (5 mg total) by mouth every 6 (six) hours as needed for moderate pain (pain score 4-6).   polyethylene glycol 17 g packet Commonly known as: MIRALAX  / GLYCOLAX  Take 17 g by mouth 2 (two) times daily.        Allergies  Allergen Reactions   Morphine And  Codeine Itching, Swelling, Hives and Shortness Of Breath    Consultations: Oncology, general surgery, radiation  oncology   Procedures/Studies: CT CHEST ABDOMEN PELVIS W CONTRAST Result Date: 02/04/2024 CLINICAL DATA:  Rectal cancer, staging EXAM: CT CHEST, ABDOMEN, AND PELVIS WITH CONTRAST TECHNIQUE: Multidetector CT imaging of the chest, abdomen and pelvis was performed following the standard protocol during bolus administration of intravenous contrast. RADIATION DOSE REDUCTION: This exam was performed according to the departmental dose-optimization program which includes automated exposure control, adjustment of the mA and/or kV according to Amy Scott size and/or use of iterative reconstruction technique. CONTRAST:  85mL OMNIPAQUE  IOHEXOL  300 MG/ML  SOLN COMPARISON:  02/04/2024, 02/02/2024 FINDINGS: CT CHEST FINDINGS Cardiovascular: The heart is unremarkable without pericardial effusion. No evidence of thoracic aortic aneurysm or dissection. Mediastinum/Nodes: No enlarged mediastinal, hilar, or axillary lymph nodes. Thyroid gland, trachea, and esophagus demonstrate no significant findings. Lungs/Pleura: No acute airspace disease, effusion, or pneumothorax. Central airways are patent. Musculoskeletal: No acute or destructive bony abnormalities. Reconstructed images demonstrate no additional findings. CT ABDOMEN PELVIS FINDINGS Hepatobiliary: Subcentimeter subcapsular right lobe liver cyst image 55/2. No other focal liver abnormalities. No biliary duct dilation. The gallbladder is unremarkable. Pancreas: Unremarkable. No pancreatic ductal dilatation or surrounding inflammatory changes. Spleen: Normal in size without focal abnormality. Adrenals/Urinary Tract: The kidneys enhance normally and symmetrically. No urinary tract calculi or obstruction. The adrenals and bladder are unremarkable. Stomach/Bowel: There is circumferential mural thickening within the distal colon at the rectosigmoid junction, corresponding to the known malignancy. There is a 1.3 x 1.8 x 1.2 cm intramural fluid collection reference image 101/2, which may  reflect ulceration within the circumferential mass. No evidence of perforation. No bowel obstruction or ileus. Vascular/Lymphatic: Subcentimeter perirectal and presacral lymph nodes are again identified, measuring 7 mm image 103/2 and 6 mm image 108/2. No pathologically enlarged lymph nodes are observed. No significant vascular findings. Reproductive: Uterus and bilateral adnexa are unremarkable. Other: Trace pelvic free fluid. No free intraperitoneal gas. No abdominal wall hernia. Musculoskeletal: No acute or destructive bony abnormalities. Reconstructed images demonstrate no additional findings. IMPRESSION: 1. Ulcerated circumferential mass involving the distal rectosigmoid colon, corresponding to biopsy-proven malignancy. 2. Subcentimeter perirectal and presacral lymph nodes concerning for nodal metastases. No pathologic adenopathy elsewhere within the abdomen or pelvis. 3. No evidence of distant metastases. Electronically Signed   By: Ozell Daring M.D.   On: 02/04/2024 20:18   MR PELVIS WO CONTRAST Result Date: 02/04/2024 CLINICAL DATA:  Rectosigmoid mass on CT EXAM: MRI PELVIS WITHOUT CONTRAST TECHNIQUE: Multiplanar multisequence MR imaging of the pelvis was performed. No intravenous contrast was administered. COMPARISON:  CT 02/02/2024 FINDINGS: The Amy Scott was unable to tolerate rectal gel and therefore decision was made to hold the exam and repeat when Amy Scott is more clinically stable (as an outpatient). Limited T2 and diffusion-weighted imaging was performed. Urinary Tract:  Normal urinary bladder.  No distal hydroureter Bowel: The rectosigmoid junction primary is identified including on 14/3. Suboptimally evaluated secondary to underdistention. There is likely transmural extension into the right mesorectum and sigmoid mesocolon including on 13/3. Vascular/Lymphatic: No pelvic aneurysm. Perirectal adenopathy including 5 mm perirectal node of on 17/3. A presacral node measures 6 mm on 12/03.  Reproductive:  Normal uterus and adnexa. Other:  Trace free pelvic fluid is likely physiologic. Musculoskeletal: No acute osseous abnormality. IMPRESSION: 1. The exam was halted because Amy Scott could not tolerate rectal gel insertion. A limited exam was performed as detailed above. 2. Rectosigmoid primary, suboptimally evaluated. Probable transmural spread into the  right mesorectum and sigmoid mesocolon. 3. Pelvic adenopathy, highly suspicious for nodal metastasis. 4. When the Amy Scott is clinically stable (i.e. As an outpatient), dedicated staging MRI should be considered. Electronically Signed   By: Rockey Kilts M.D.   On: 02/04/2024 16:03   CT Renal Stone Study Result Date: 02/02/2024 CLINICAL DATA:  Right lower quadrant abdominal pain, history of appendectomy EXAM: CT ABDOMEN AND PELVIS WITHOUT CONTRAST TECHNIQUE: Multidetector CT imaging of the abdomen and pelvis was performed following the standard protocol without IV contrast. RADIATION DOSE REDUCTION: This exam was performed according to the departmental dose-optimization program which includes automated exposure control, adjustment of the mA and/or kV according to Amy Scott size and/or use of iterative reconstruction technique. COMPARISON:  None Available. FINDINGS: Lower chest: No acute abnormality. Hepatobiliary: No focal liver abnormality is seen. No gallstones, gallbladder wall thickening, or biliary dilatation. Pancreas: Unremarkable Spleen: Unremarkable Adrenals/Urinary Tract: The adrenal glands are unremarkable. Fine calcifications are seen within the kidneys bilaterally in keeping medullary nephrocalcinosis. No urinary renal or ureteral calculi are identified. No hydronephrosis. No perinephric inflammatory stranding or fluid collections are seen. Bladder is unremarkable Stomach/Bowel: There is circumferential bowel wall thickening and extensive perirectal inflammatory stranding involving the mid and high rectum with extensive inflammatory changes  extending into the presacral space, best appreciated on image # 57/2 and 70/7. There is suggestion of nodular soft tissue within the colorectal mesentery at this level (102/5) which may reflect inflammatory soft tissue/phlegmon. This may be infectious or inflammatory in nature as can be seen with inflammatory bowel disease. However, an infiltrative mass with transmural extension could appear similarly. Extensive perirectal adenopathy is present, possibly reactive though infiltrative disease could appear similarly. There is a resultant partial large bowel obstruction with moderate volume stool seen throughout the more proximal colon. The stomach, small bowel, and large bowel are otherwise unremarkable. Appendix has been removed. No free intraperitoneal gas or fluid Vascular/Lymphatic: The left gonadal vein appears dilated but is not well assessed on this noncontrast examination. The abdominal vasculature is otherwise unremarkable. No additional pathologic adenopathy identified within the abdomen and pelvis. Reproductive: Uterus and bilateral adnexa are unremarkable. Other: No abdominal wall hernia Musculoskeletal: No acute or significant osseous findings. IMPRESSION: 1. Circumferential bowel wall thickening and extensive perirectal inflammatory stranding involving the mid and high rectum with extensive inflammatory changes extending into the presacral space. There is suggestion of nodular soft tissue within the colorectal mesentery at this level which may reflect inflammatory soft tissue/phlegmon. This may be infectious or inflammatory in nature as can be seen with inflammatory bowel disease. However, an infiltrative mass with transmural extension could appear similarly. Correlation with endoscopy is recommended. 2. Extensive perirectal adenopathy, possibly reactive though infiltrative disease could appear similarly. 3. Resultant partial large bowel obstruction with moderate volume stool seen throughout the more  proximal colon. 4. Medullary nephrocalcinosis. Differential considerations include systemic hypercalcemia, renal tubular acidosis, or hyperparathyroidism Electronically Signed   By: Dorethia Molt M.D.   On: 02/02/2024 02:49      Subjective: Amy Scott seen and examined at bedside today.  Comfortable.  Denies any abdomen pain, nausea or vomiting.  Tolerating diet.  Having bowel movements.  Eager to go home.  Medically stable for discharge home today.  Discharge Exam: Vitals:   02/06/24 2007 02/07/24 0616  BP: 139/88 130/77  Pulse: (!) 53 (!) 56  Resp: 18 18  Temp: 98.9 F (37.2 C) 98.7 F (37.1 C)  SpO2: 100% 100%   Vitals:   02/06/24 0643 02/06/24 1236  02/06/24 2007 02/07/24 0616  BP: 126/79 131/84 139/88 130/77  Pulse: 64 (!) 56 (!) 53 (!) 56  Resp: 18  18 18   Temp: 98.8 F (37.1 C) 98.5 F (36.9 C) 98.9 F (37.2 C) 98.7 F (37.1 C)  TempSrc: Oral Oral Oral Oral  SpO2: 100% 100% 100% 100%  Weight:      Height:        General: Pt is alert, awake, not in acute distress Cardiovascular: RRR, S1/S2 +, no rubs, no gallops Respiratory: CTA bilaterally, no wheezing, no rhonchi Abdominal: Soft, NT, ND, bowel sounds + Extremities: no edema, no cyanosis    The results of significant diagnostics from this hospitalization (including imaging, microbiology, ancillary and laboratory) are listed below for reference.     Microbiology: Recent Results (from the past 240 hours)  Blood culture (routine x 2)     Status: None   Collection Time: 02/02/24  3:55 AM   Specimen: BLOOD  Result Value Ref Range Status   Specimen Description   Final    BLOOD RIGHT ANTECUBITAL Performed at [facility], [address] [place], [place] [address]    Special Requests   Final    BOTTLES DRAWN AEROBIC AND ANAEROBIC Blood Culture adequate volume Performed at [facility], [address] [place], [place] [address]    Culture   Final    NO GROWTH 5 DAYS Performed at [facility], [address] [place], [place] [address]    Report Status 02/07/2024 FINAL  Final  Blood culture (routine x 2)     Status: None   Collection Time: 02/02/24  4:00 AM   Specimen: BLOOD  Result Value Ref Range Status   Specimen Description   Final    BLOOD LEFT ANTECUBITAL Performed at [facility], [address] [place], [place] [address]    Special Requests   Final    BOTTLES DRAWN AEROBIC AND ANAEROBIC Blood Culture adequate volume Performed at [facility], [address] [place], [place] [address]    Culture   Final    NO GROWTH 5 DAYS Performed at [facility], [address], [place], [place] [address]    Report Status 02/07/2024 FINAL  Final  Gastrointestinal Panel by PCR , Stool     Status: None   Collection Time: 02/02/24  7:14 PM   Specimen: Stool  Result Value Ref Range Status   Campylobacter species NOT DETECTED NOT DETECTED Final   Plesimonas shigelloides NOT DETECTED NOT DETECTED Final   Salmonella species NOT DETECTED NOT DETECTED Final   Yersinia enterocolitica NOT DETECTED NOT DETECTED Final   Vibrio species NOT DETECTED NOT DETECTED Final   Vibrio cholerae NOT DETECTED NOT DETECTED Final   Enteroaggregative E coli (EAEC) NOT DETECTED NOT DETECTED Final   Enteropathogenic E coli (EPEC) NOT DETECTED NOT DETECTED Final   Enterotoxigenic E coli (ETEC) NOT DETECTED NOT DETECTED Final   Shiga like toxin producing E coli (STEC) NOT DETECTED NOT DETECTED Final   Shigella/Enteroinvasive E coli (EIEC) NOT DETECTED NOT DETECTED Final   Cryptosporidium NOT DETECTED NOT DETECTED Final   Cyclospora cayetanensis NOT DETECTED NOT DETECTED Final   Entamoeba histolytica NOT DETECTED NOT DETECTED Final   Giardia lamblia NOT DETECTED NOT DETECTED Final   Adenovirus F40/41 NOT DETECTED NOT DETECTED Final   Astrovirus NOT DETECTED NOT DETECTED Final   Norovirus GI/GII NOT DETECTED NOT DETECTED Final  Rotavirus A NOT DETECTED NOT  DETECTED Final   Sapovirus (I, II, IV, and V) NOT DETECTED NOT DETECTED Final    Comment: Performed at [facility], [address] [place], [place] [address]     Labs: BNP (last 3 results) No results for input(s): BNP in the last 8760 hours. Basic Metabolic Panel: Recent Labs  Lab 02/01/24 2338 02/03/24 0533  NA 142 137  K 3.4* 3.5  CL 107 107  CO2 23 19*  GLUCOSE 92 81  BUN 9 <5*  CREATININE 0.67 0.51  CALCIUM 9.1 8.4*   Liver Function Tests: Recent Labs  Lab 02/01/24 2338  AST 12*  ALT <5  ALKPHOS 91  BILITOT 0.5  PROT 7.4  ALBUMIN 4.1   No results for input(s): LIPASE, AMYLASE in the last 168 hours. No results for input(s): AMMONIA in the last 168 hours. CBC: Recent Labs  Lab 02/01/24 2338 02/03/24 0533 02/06/24 0930  WBC 9.1 6.9 3.6*  NEUTROABS 5.9  --   --   HGB 11.1* 9.4* 11.0*  HCT 33.0* 29.7* 34.6*  MCV 82.9 84.9 84.0  PLT 326 293 359   Cardiac Enzymes: No results for input(s): CKTOTAL, CKMB, CKMBINDEX, TROPONINI in the last 168 hours. BNP: Invalid input(s): POCBNP CBG: No results for input(s): GLUCAP in the last 168 hours. D-Dimer No results for input(s): DDIMER in the last 72 hours. Hgb A1c No results for input(s): HGBA1C in the last 72 hours. Lipid Profile No results for input(s): CHOL, HDL, LDLCALC, TRIG, CHOLHDL, LDLDIRECT in the last 72 hours. Thyroid function studies No results for input(s): TSH, T4TOTAL, T3FREE, THYROIDAB in the last 72 hours.  Invalid input(s): FREET3 Anemia work up No results for input(s): VITAMINB12, FOLATE, FERRITIN, TIBC, IRON , RETICCTPCT in the last 72 hours. Urinalysis    Component Value Date/Time   COLORURINE AMBER (A) 02/01/2024 2336   APPEARANCEUR HAZY (A) 02/01/2024 2336   LABSPEC 1.020 02/01/2024 2336   PHURINE 7.5 02/01/2024 2336   GLUCOSEU 100 (A) 02/01/2024 2336   HGBUR MODERATE (A) 02/01/2024 2336   BILIRUBINUR MODERATE (A)  02/01/2024 2336   KETONESUR 40 (A) 02/01/2024 2336   PROTEINUR 30 (A) 02/01/2024 2336   UROBILINOGEN 1.0 04/10/2013 2052   NITRITE NEGATIVE 02/01/2024 2336   LEUKOCYTESUR NEGATIVE 02/01/2024 2336   Sepsis Labs Recent Labs  Lab 02/01/24 2338 02/03/24 0533 02/06/24 0930  WBC 9.1 6.9 3.6*   Microbiology Recent Results (from the past 240 hours)  Blood culture (routine x 2)     Status: None   Collection Time: 02/02/24  3:55 AM   Specimen: BLOOD  Result Value Ref Range Status   Specimen Description   Final    BLOOD RIGHT ANTECUBITAL Performed at [facility], [address] [place], [place] [address]    Special Requests   Final    BOTTLES DRAWN AEROBIC AND ANAEROBIC Blood Culture adequate volume Performed at [facility], [address] [place], [place] [address]    Culture   Final    NO GROWTH 5 DAYS Performed at [facility], [address] [place], [place] [address]    Report Status 02/07/2024 FINAL  Final  Blood culture (routine x 2)     Status: None   Collection Time: 02/02/24  4:00 AM   Specimen: BLOOD  Result Value Ref Range Status   Specimen Description   Final    BLOOD LEFT ANTECUBITAL Performed at [facility], [address] [place], [place] [address]    Special Requests   Final    BOTTLES DRAWN AEROBIC AND ANAEROBIC Blood Culture adequate volume Performed at [facility], [address] [place], [place] [address]    Culture   Final    NO GROWTH 5 DAYS Performed at [facility], [address], [place], [place] [address]    Report Status 02/07/2024 FINAL  Final  Gastrointestinal Panel by PCR , Stool     Status: None   Collection Time: 02/02/24  7:14 PM   Specimen: Stool  Result Value Ref Range Status   Campylobacter species NOT DETECTED NOT DETECTED Final   Plesimonas shigelloides NOT DETECTED NOT DETECTED Final   Salmonella species NOT DETECTED NOT DETECTED Final   Yersinia enterocolitica NOT  DETECTED NOT DETECTED Final   Vibrio species NOT DETECTED NOT DETECTED Final   Vibrio cholerae NOT DETECTED NOT DETECTED Final   Enteroaggregative E coli (EAEC) NOT DETECTED NOT DETECTED Final   Enteropathogenic E coli (EPEC) NOT DETECTED NOT DETECTED Final   Enterotoxigenic E coli (ETEC) NOT DETECTED NOT DETECTED Final   Shiga like toxin producing E coli (STEC) NOT DETECTED NOT DETECTED Final   Shigella/Enteroinvasive E coli (EIEC) NOT DETECTED NOT DETECTED Final   Cryptosporidium NOT DETECTED NOT DETECTED Final   Cyclospora cayetanensis NOT DETECTED NOT DETECTED Final   Entamoeba histolytica NOT DETECTED NOT DETECTED Final   Giardia lamblia NOT DETECTED NOT DETECTED Final   Adenovirus F40/41 NOT DETECTED NOT DETECTED Final   Astrovirus NOT DETECTED NOT DETECTED Final   Norovirus GI/GII NOT DETECTED NOT DETECTED Final   Rotavirus A NOT DETECTED NOT DETECTED Final   Sapovirus (I, II, IV, and V) NOT DETECTED NOT DETECTED Final    Comment: Performed at [facility], [address], [place], [place] [address]    Please note: You were cared for by a hospitalist during your hospital stay. Once you are discharged, your primary care physician will handle any further medical issues. Please note that NO REFILLS for any discharge medications will be authorized once you are discharged, as it is imperative that you return to your primary care physician (or establish a relationship with a primary care physician if you do not have one) for your post hospital discharge needs so that they can reassess your need for medications and monitor your lab values.    Time coordinating discharge: 40 minutes  SIGNED:   Ivonne Mustache, MD  [facility] 02/07/2024, 10:45 AM Pager [phone]  If 7PM-7AM, please contact night-coverage [email or web address] Password [email or web address]

## 2024-02-07 NOTE — Progress Notes (Signed)
 Patient recently admitted to the hospital for abdominal pain. She was diagnosed with rectal cancer. Discharged today.   Reached out to [name] to introduce myself as the office RN Navigator and explain our new patient process. Reviewed the reason for their referral and scheduled their new patient appointment along with labs. Provided address and directions to the office including call back phone number. Reviewed with patient any concerns they may have or any possible barriers to attending their appointment.   Informed patient about my role as a navigator and that I will meet with them prior to their New Patient appointment and more fully discuss what services I can provide. At this time patient has no further questions or needs.    Oncology Nurse Navigator Documentation     02/07/2024    2:00 PM  Oncology Nurse Navigator Flowsheets  Abnormal Finding Date 02/02/2024  Confirmed Diagnosis Date 02/03/2024  Navigator Follow Up Date: 02/11/2024  Navigator Follow Up Reason: New Patient Appointment  Navigator Location [facility]  Referral Date to RadOnc/MedOnc 02/04/2024  Navigator Encounter Type Introductory Phone Call  Patient Visit Type MedOnc  Treatment Phase Pre-Tx/Tx Discussion  Barriers/Navigation Needs Coordination of Care;Education  Education Other  Interventions Coordination of Care;Education  Acuity Level 2-Minimal Needs (1-2 Barriers Identified)  Coordination of Care Appts  Education Method Verbal  Time Spent with Patient 30

## 2024-02-07 NOTE — Progress Notes (Signed)
 Discharge instructions given to patient and family, no questions at this time, discharge medications delivered to patient at bedside.

## 2024-02-07 NOTE — TOC Initial Note (Signed)
 Transition of Care [facility]) - Initial/Assessment Note    Patient Details  Name: Amy Scott MRN: [id number] Date of Birth: 15-Mar-1991  Transition of Care [facility]) CM/SW Contact:    Doneta Glenys DASEN, RN Phone Number: 02/07/2024, 9:57 AM  Clinical Narrative:                 CM met with patient induced and explained role. Patient agreeable to allowing CM to schedule appointment. Patient Goals and CMS Choice    Expected Discharge Plan and Services                     Prior Living Arrangements/Services                       Activities of Daily Living   ADL Screening (condition at time of admission) Independently performs ADLs?: Yes (appropriate for developmental age) Is the patient deaf or have difficulty hearing?: No Does the patient have difficulty seeing, even when wearing glasses/contacts?: Yes Does the patient have difficulty concentrating, remembering, or making decisions?: No  Permission Sought/Granted                  Emotional Assessment              Admission diagnosis:  Phlegmon [L02.91] Lymphadenopathy [R59.1] Large bowel obstruction (HCC) [K56.609] Colon wall thickening [K63.9] Abdominal pain [R10.9] Patient Active Problem List   Diagnosis Date Noted   Abdominal pain 02/02/2024   Normocytic anemia 02/02/2024   Acute pyelonephritis 05/06/2018   Preeclampsia in postpartum period 07/22/2017   Chlamydia infection affecting pregnancy 07/02/2017   History of smoking 02/11/2016   PCP:  Patient, No Pcp Per Pharmacy:   [id number] - [place], [place] - [address] AT [facility] [address] [place] [address] Phone: [phone] Fax: [phone]     Social Drivers of Health (SDOH) Social History: SDOH Screenings   Food Insecurity: No Food Insecurity (02/03/2024)  Housing: Unknown (02/03/2024)  Transportation Needs: No Transportation Needs (02/03/2024)  Utilities: Not At Risk (02/03/2024)   Tobacco Use: Medium Risk (02/03/2024)   SDOH Interventions:     Readmission Risk Interventions    02/03/2024   10:19 AM  Readmission Risk Prevention Plan  Post Dischage Appt Complete  Medication Screening Complete  Transportation Screening Complete

## 2024-02-09 ENCOUNTER — Telehealth: Payer: Self-pay | Admitting: Genetic Counselor

## 2024-02-09 ENCOUNTER — Encounter: Payer: Self-pay | Admitting: *Deleted

## 2024-02-09 ENCOUNTER — Other Ambulatory Visit: Payer: Self-pay

## 2024-02-09 NOTE — Telephone Encounter (Signed)
 Patient presented on the GI tumor board.  Due to young age of onset, she is a candidate for genetic testing.  Her MMR is intact, but she is young, still a concern for Lynch syndrome.    Darice Monte, MS, CGC  Licensed, Patent attorney [email or web address] phone: [phone]

## 2024-02-09 NOTE — Progress Notes (Signed)
 Patient presented at GI [facility]. Recommendation for neoadjuvant concurrent chemo/radiation followed by surgery. She is scheduled for staging MRI on 02/14/2024. She is scheduled next week for simulation.   She qualifies for genetics. Dr Maritza will speak to our GYN ONC surgeons about ovarian preservation surrounding radiation.   Per Dr Timmy, request to add on MSI testing sent on specimen [id number] DOS 02/03/2024  Oncology Nurse Navigator Documentation     02/09/2024    7:30 AM  Oncology Nurse Navigator Flowsheets  Navigator Follow Up Date: 02/11/2024  Navigator Follow Up Reason: New Patient Appointment  Navigator Location [facility]  Navigator Encounter Type Clinic/MDC  Multidisiplinary Clinic Date 02/09/2024  Multidisiplinary Clinic Type GI  Patient Visit Type MedOnc  Treatment Phase Pre-Tx/Tx Discussion  Barriers/Navigation Needs Coordination of Care;Education  Interventions Coordination of Care;Education  Acuity Level 2-Minimal Needs (1-2 Barriers Identified)  Coordination of Care Other  Time Spent with Patient 60

## 2024-02-09 NOTE — Progress Notes (Signed)
 The proposed treatment discussed in conference is for discussion purpose only and is not a binding recommendation.  The patients have not been physically examined, or presented with their treatment options.  Therefore, final treatment plans cannot be decided.

## 2024-02-09 NOTE — Progress Notes (Signed)
.  rad

## 2024-02-09 NOTE — Progress Notes (Incomplete Revision)
 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   [name] presented here today for a follow up new appointment with Dr. Maritza. Patient had a sigmoidoscopy on 02/03/24 with Dr.Toth.  Biopsies of *** (if applicable) revealed: {:18581}  Past/Anticipated interventions by surgeon, if any:   Past/Anticipated interventions by medical oncology, if any: {:18581}  Weight changes, if any: {:18581}  Bowel/Bladder complaints, if any: {:18581}  Nausea / Vomiting, if any: {:18581}  Pain issues, if any:  {:18581}  Any blood per rectum:   {:18581}  SAFETY ISSUES: Prior radiation? {:18581} Pacemaker/ICD? {:18581} Possible current pregnancy? {:18581} Is the patient on methotrexate? {:18581}  Current Complaints/Details:She is scheduled for staging MRI on 02/14/2024.  She is scheduled next week for simulation.

## 2024-02-10 ENCOUNTER — Encounter: Payer: Self-pay | Admitting: Radiation Oncology

## 2024-02-10 ENCOUNTER — Inpatient Hospital Stay: Attending: Hematology & Oncology

## 2024-02-10 ENCOUNTER — Ambulatory Visit
Admission: RE | Admit: 2024-02-10 | Discharge: 2024-02-10 | Discharge status: Home / Self Care | Source: Ambulatory Visit | Attending: Radiation Oncology | Admitting: Radiation Oncology

## 2024-02-10 ENCOUNTER — Ambulatory Visit
Admit: 2024-02-10 | Discharge: 2024-02-10 | Disposition: A | Discharge status: Home / Self Care | Attending: Radiation Oncology | Admitting: Radiation Oncology

## 2024-02-10 DIAGNOSIS — R590 Localized enlarged lymph nodes: Secondary | ICD-10-CM | POA: Insufficient documentation

## 2024-02-10 DIAGNOSIS — Z452 Encounter for adjustment and management of vascular access device: Secondary | ICD-10-CM | POA: Insufficient documentation

## 2024-02-10 DIAGNOSIS — C19 Malignant neoplasm of rectosigmoid junction: Secondary | ICD-10-CM

## 2024-02-10 DIAGNOSIS — G893 Neoplasm related pain (acute) (chronic): Secondary | ICD-10-CM | POA: Insufficient documentation

## 2024-02-10 DIAGNOSIS — Z5111 Encounter for antineoplastic chemotherapy: Secondary | ICD-10-CM | POA: Insufficient documentation

## 2024-02-10 NOTE — Progress Notes (Signed)
 [facility] Clinical Social Work  Initial Assessment   Amy Scott is a 33 y.o. year old female contacted by phone. Clinical Social Work was referred by medical provider for assessment of psychosocial needs.   SDOH (Social Determinants of Health) assessments performed: Yes   SDOH Screenings   Food Insecurity: No Food Insecurity (02/10/2024)  Housing: Low Risk  (02/10/2024)  Transportation Needs: No Transportation Needs (02/10/2024)  Utilities: Not At Risk (02/10/2024)  Depression (PHQ2-9): Low Risk  (02/10/2024)  Tobacco Use: Medium Risk (02/03/2024)    PHQ 2/9:    02/10/2024    8:36 AM  Depression screen PHQ 2/9  Decreased Interest 0  Down, Depressed, Hopeless 0  PHQ - 2 Score 0     Distress Screen completed: No     No data to display            Family/Social Information:  Housing Arrangement: patient lives with her sister and children. Family members/support persons in your life? Family.  Patient has four children ages 54,9,7 and 54.  Her ex-husband is currently in [place]. Transportation concerns: no  Employment: Unemployed.  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 yes Concerns about diagnosis and/or treatment: Losing my job and/or losing income Patient reported stressors: Therapist, art and/or priorities: Family Patient enjoys time with family/ friends Current coping skills/ strengths: Microbiologist of independent living , Manufacturing systems engineer , Radio producer fund of knowledge , Motivation for treatment/growth , and Supportive family/friends     SUMMARY: Current SDOH  Barriers:  Financial constraints related to no income.  Clinical Social Work Clinical Goal(s):  Explore community resource options for unmet needs related to:  Financial Strain   Interventions: Discussed common feeling and emotions when being diagnosed with cancer, and the importance of support during treatment Informed patient of the support team roles and support services at [facility] Provided CSW contact information and encouraged patient to call with any questions or concerns Provided patient with information about the [employer] and made referral to [employer].  Patient receives food stamps and meets financial requirement.  CSW also obtained information for [facility] social security disability referral.    Follow Up Plan: CSW will follow-up with patient by phone  Patient verbalizes understanding of plan: Yes    [name], LCSW Clinical Social Worker [facility]

## 2024-02-10 NOTE — Progress Notes (Signed)
 Radiation Oncology         [phone] ________________________________  Consultation conducted via telephone for patient preference.  I spoke with the patient to conduct this consult visit via telephone. The patient was notified in advance and was offered an in person or telemedicine meeting and opted to proceed with a telephone consult.   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 female seen in consultation for radiation therapy.  She was initially seen in the hospital after presenting with a month of R sided abdominal and flank pain. Work up revealed a rectal mass causing a partial obstruction. This was biopsied and consistent with adenocarcinoma of the rectum. Surgery weighed in while she was inpatient and did not recommend any surgical procedures at this time. Staging CT CAP was without metastatic disease, specifically there is no visible disease in the lungs or liver. There is pelvic adenopathy on MRI and CT CAP. MRI was of poor quality and is going to be repeated next week for staging completion and to assist in radiation planning.  Since we last saw her we have discussed her case at tumor board with final recommendation for neoadjuvant chemoRT followed by neoadjuvant chemotherapy: total neoadjuvant therapy. She is scheduled for a radiation simulation scan next week and can begin radiation early the following week. She sees Dr. Timmy in medical oncology tomorrow for discussion of concurrent chemotherapy.   Today she reports her pain is well managed. She has some mild rectal discomfort when sitting in certain positions and when having bowel movements. Taking oxycodone  for pain. Has not been using any OTC analgesics like tylenol   or ibuprofen .     PAST MEDICAL HISTORY:  Past Medical History:  Diagnosis Date   Headache(784.0)    No pertinent past medical history        PAST SURGICAL HISTORY: Past Surgical History:  Procedure Laterality Date   APPENDECTOMY     FLEXIBLE SIGMOIDOSCOPY N/A 02/03/2024   Procedure: KINGSTON SIDE;  Surgeon: Rollin Dover, MD;  Location: [facility];  Service: Gastroenterology;  Laterality: N/A;  rectal bleeding/abnormal CT scan.   MOUTH SURGERY       FAMILY HISTORY: History reviewed. No pertinent family history.   SOCIAL HISTORY:  reports that she has quit smoking. Her smoking use included cigarettes. She has never used smokeless tobacco. She reports that she does not drink alcohol and does not use drugs.   ALLERGIES: Morphine and codeine   MEDICATIONS:  Current Outpatient Medications  Medication Sig Dispense Refill   feeding supplement (ENSURE PLUS HIGH PROTEIN) LIQD Take 237 mLs by mouth 2 (two) times daily between meals. 5000 mL 0   oxyCODONE  (OXY IR/ROXICODONE ) 5 MG immediate release tablet Take 1 tablet (5 mg total) by mouth every 6 (six) hours as needed for moderate pain (pain score 4-6). 30 tablet 0   polyethylene glycol powder (GLYCOLAX /MIRALAX ) 17 GM/SCOOP powder Take 17 g dissolved in liquid by mouth 2 (two) times daily. 238 g 0   No current facility-administered medications for this encounter.     REVIEW OF SYSTEMS: The patient reports that she is doing well overall and a review of symptoms is otherwise negative.      PHYSICAL EXAM:  Deferred, telephone visit   LABORATORY DATA:  Lab Results  Component Value Date   WBC 3.6 (L) 02/06/2024   HGB 11.0 (L) 02/06/2024   HCT 34.6 (L) 02/06/2024   MCV 84.0 02/06/2024   PLT 359 02/06/2024   Lab Results  Component Value Date   NA 137 02/03/2024   K 3.5 02/03/2024   CL 107 02/03/2024   CO2 19 (L) 02/03/2024   Lab Results  Component Value Date   ALT <5 02/01/2024   AST 12 (L) 02/01/2024    ALKPHOS 91 02/01/2024   BILITOT 0.5 02/01/2024      RADIOGRAPHY: CT CHEST ABDOMEN PELVIS W CONTRAST Result Date: 02/04/2024 CLINICAL DATA:  Rectal cancer, staging EXAM: CT CHEST, ABDOMEN, AND PELVIS WITH CONTRAST TECHNIQUE: Multidetector CT imaging of the chest, abdomen and pelvis was performed following the standard protocol during bolus administration of intravenous contrast. RADIATION DOSE REDUCTION: This exam was performed according to the departmental dose-optimization program which includes automated exposure control, adjustment of the mA and/or kV according to patient size and/or use of iterative reconstruction technique. CONTRAST:  85mL OMNIPAQUE  IOHEXOL  300 MG/ML  SOLN COMPARISON:  02/04/2024, 02/02/2024 FINDINGS: CT CHEST FINDINGS Cardiovascular: The heart is unremarkable without pericardial effusion. No evidence of thoracic aortic aneurysm or dissection. Mediastinum/Nodes: No enlarged mediastinal, hilar, or axillary lymph nodes. Thyroid gland, trachea, and esophagus demonstrate no significant findings. Lungs/Pleura: No acute airspace disease, effusion, or pneumothorax. Central airways are patent. Musculoskeletal: No acute or destructive bony abnormalities. Reconstructed images demonstrate no additional findings. CT ABDOMEN PELVIS FINDINGS Hepatobiliary: Subcentimeter subcapsular right lobe liver cyst image 55/2. No other focal liver abnormalities. No biliary duct dilation. The gallbladder is unremarkable. Pancreas: Unremarkable. No pancreatic ductal dilatation or surrounding inflammatory changes. Spleen: Normal in size without focal abnormality. Adrenals/Urinary Tract: The kidneys enhance normally and symmetrically. No urinary tract calculi or obstruction. The adrenals and bladder are unremarkable. Stomach/Bowel: There is circumferential mural thickening within the distal colon at the rectosigmoid junction, corresponding to the known malignancy. There is a 1.3 x 1.8 x 1.2 cm intramural fluid  collection reference image 101/2, which may reflect ulceration within the circumferential mass. No evidence of perforation. No bowel obstruction or ileus. Vascular/Lymphatic: Subcentimeter perirectal and presacral lymph nodes are again identified, measuring 7 mm image 103/2 and 6 mm image 108/2. No pathologically enlarged lymph nodes are observed. No significant vascular findings. Reproductive: Uterus and bilateral adnexa are unremarkable. Other: Trace pelvic free fluid. No free intraperitoneal gas. No abdominal wall hernia. Musculoskeletal: No acute or destructive bony abnormalities. Reconstructed images demonstrate no additional findings. IMPRESSION: 1. Ulcerated circumferential mass involving the distal rectosigmoid colon, corresponding to biopsy-proven malignancy. 2. Subcentimeter perirectal and presacral lymph nodes concerning for nodal metastases. No pathologic adenopathy elsewhere within the abdomen or pelvis. 3. No evidence of distant metastases. Electronically Signed   By: Ozell Daring M.D.   On: 02/04/2024 20:18   MR PELVIS WO CONTRAST Result Date: 02/04/2024 CLINICAL DATA:  Rectosigmoid mass on CT EXAM: MRI PELVIS WITHOUT CONTRAST TECHNIQUE: Multiplanar multisequence MR imaging of the pelvis was performed. No intravenous contrast was administered. COMPARISON:  CT 02/02/2024 FINDINGS: The patient was unable to tolerate rectal gel and therefore decision was made to hold the exam and repeat when patient is more clinically stable (as an outpatient). Limited T2 and diffusion-weighted imaging was performed. Urinary Tract:  Normal urinary bladder.  No distal hydroureter Bowel: The rectosigmoid junction primary is identified including on 14/3. Suboptimally evaluated secondary to underdistention. There is likely transmural  extension into the right mesorectum and sigmoid mesocolon including on 13/3. Vascular/Lymphatic: No pelvic aneurysm. Perirectal adenopathy including 5 mm perirectal node of on 17/3. A  presacral node measures 6 mm on 12/03. Reproductive:  Normal uterus and adnexa. Other:  Trace free pelvic fluid is likely physiologic. Musculoskeletal: No acute osseous abnormality. IMPRESSION: 1. The exam was halted because patient could not tolerate rectal gel insertion. A limited exam was performed as detailed above. 2. Rectosigmoid primary, suboptimally evaluated. Probable transmural spread into the right mesorectum and sigmoid mesocolon. 3. Pelvic adenopathy, highly suspicious for nodal metastasis. 4. When the patient is clinically stable (i.e. As an outpatient), dedicated staging MRI should be considered. Electronically Signed   By: Rockey Kilts M.D.   On: 02/04/2024 16:03   CT Renal Stone Study Result Date: 02/02/2024 CLINICAL DATA:  Right lower quadrant abdominal pain, history of appendectomy EXAM: CT ABDOMEN AND PELVIS WITHOUT CONTRAST TECHNIQUE: Multidetector CT imaging of the abdomen and pelvis was performed following the standard protocol without IV contrast. RADIATION DOSE REDUCTION: This exam was performed according to the departmental dose-optimization program which includes automated exposure control, adjustment of the mA and/or kV according to patient size and/or use of iterative reconstruction technique. COMPARISON:  None Available. FINDINGS: Lower chest: No acute abnormality. Hepatobiliary: No focal liver abnormality is seen. No gallstones, gallbladder wall thickening, or biliary dilatation. Pancreas: Unremarkable Spleen: Unremarkable Adrenals/Urinary Tract: The adrenal glands are unremarkable. Fine calcifications are seen within the kidneys bilaterally in keeping medullary nephrocalcinosis. No urinary renal or ureteral calculi are identified. No hydronephrosis. No perinephric inflammatory stranding or fluid collections are seen. Bladder is unremarkable Stomach/Bowel: There is circumferential bowel wall thickening and extensive perirectal inflammatory stranding involving the mid and high rectum  with extensive inflammatory changes extending into the presacral space, best appreciated on image # 57/2 and 70/7. There is suggestion of nodular soft tissue within the colorectal mesentery at this level (102/5) which may reflect inflammatory soft tissue/phlegmon. This may be infectious or inflammatory in nature as can be seen with inflammatory bowel disease. However, an infiltrative mass with transmural extension could appear similarly. Extensive perirectal adenopathy is present, possibly reactive though infiltrative disease could appear similarly. There is a resultant partial large bowel obstruction with moderate volume stool seen throughout the more proximal colon. The stomach, small bowel, and large bowel are otherwise unremarkable. Appendix has been removed. No free intraperitoneal gas or fluid Vascular/Lymphatic: The left gonadal vein appears dilated but is not well assessed on this noncontrast examination. The abdominal vasculature is otherwise unremarkable. No additional pathologic adenopathy identified within the abdomen and pelvis. Reproductive: Uterus and bilateral adnexa are unremarkable. Other: No abdominal wall hernia Musculoskeletal: No acute or significant osseous findings. IMPRESSION: 1. Circumferential bowel wall thickening and extensive perirectal inflammatory stranding involving the mid and high rectum with extensive inflammatory changes extending into the presacral space. There is suggestion of nodular soft tissue within the colorectal mesentery at this level which may reflect inflammatory soft tissue/phlegmon. This may be infectious or inflammatory in nature as can be seen with inflammatory bowel disease. However, an infiltrative mass with transmural extension could appear similarly. Correlation with endoscopy is recommended. 2. Extensive perirectal adenopathy, possibly reactive though infiltrative disease could appear similarly. 3. Resultant partial large bowel obstruction with moderate volume  stool seen throughout the more proximal colon. 4. Medullary nephrocalcinosis. Differential considerations include systemic hypercalcemia, renal tubular acidosis, or hyperparathyroidism Electronically Signed   By: Dorethia Molt M.D.   On: 02/02/2024 02:49  PATHOLOGY:    Rectal Mass Biopsy 02/03/24:   SURGICAL PATHOLOGY  CASE: [id number]  PATIENT: Amy Scott  Surgical Pathology Report   Clinical History: Rectal bleeding with abnormal CT scan ([facility] )    FINAL MICROSCOPIC DIAGNOSIS:   A. COLON, RECTOSIGMOID, BIOPSY:  - Invasive moderately differentiated adenocarcinoma    IMPRESSION/PLAN: 33 y.o. 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    We reviewed again the general treatment paradigm, the logistics of radiation therapy, and the treatment side effects. Specifically today we discussed radiation's side effects on ovarian function including premature menopause. We also discussed possible treatments including hormone therapy and even transposition of the ovaries or a single ovary prior to radiation. We discussed the pros and cons of hormone therapy versus maintenance of her natural hormones with transposition of an ovary. She would like to proceed forward with chemoRT as planned and does not wish to undergo surgery to transpose her ovaries.   Plan:  -Repeat pelvic MRI for improved locoregional staging. -CT simulation scheduled for next Wednesday. -Consult with medical oncology tomorrow   -Pain management: Recommended using tylenol  to avoid overuse of oxycodone  which can cause sedation/constipation  ---  This encounter was conducted via telephone. The patient has provided two factor identification and has given verbal consent for this type of encounter and has been advised to only accept a meeting of this type in a secure network environment.  The  attendants for this meeting include Eudora Sharps, Estefana Cha  and Amy Scott. During the encounter, Eudora Sharps and I were located at [facility] Radiation Oncology Department. Amy Scott was located at home.   Total time spent today in preparation for this visit was 45 minutes. This included patient care, imaging and path review, documentation, multidisciplinary discussion and coordination of care and follow up.    Estefana HERO. Cha, M.D.

## 2024-02-11 ENCOUNTER — Telehealth: Payer: Self-pay | Admitting: Pharmacy Technician

## 2024-02-11 ENCOUNTER — Ambulatory Visit: Admitting: Hematology & Oncology

## 2024-02-11 ENCOUNTER — Encounter: Payer: Self-pay | Admitting: *Deleted

## 2024-02-11 ENCOUNTER — Inpatient Hospital Stay

## 2024-02-11 ENCOUNTER — Encounter: Payer: Self-pay | Admitting: Hematology & Oncology

## 2024-02-11 VITALS — BP 106/66 | HR 62 | Temp 98.0°F | Resp 18 | Ht 64.0 in | Wt 103.8 lb

## 2024-02-11 DIAGNOSIS — G893 Neoplasm related pain (acute) (chronic): Secondary | ICD-10-CM | POA: Diagnosis not present

## 2024-02-11 DIAGNOSIS — C20 Malignant neoplasm of rectum: Secondary | ICD-10-CM | POA: Diagnosis not present

## 2024-02-11 DIAGNOSIS — C19 Malignant neoplasm of rectosigmoid junction: Secondary | ICD-10-CM | POA: Diagnosis present

## 2024-02-11 DIAGNOSIS — Z5111 Encounter for antineoplastic chemotherapy: Secondary | ICD-10-CM | POA: Diagnosis present

## 2024-02-11 DIAGNOSIS — Z452 Encounter for adjustment and management of vascular access device: Secondary | ICD-10-CM | POA: Diagnosis not present

## 2024-02-11 DIAGNOSIS — R590 Localized enlarged lymph nodes: Secondary | ICD-10-CM | POA: Diagnosis not present

## 2024-02-11 LAB — CBC WITH DIFFERENTIAL (CANCER CENTER ONLY)
Abs Immature Granulocytes: 0.01 K/uL (ref 0.00–0.07)
Basophils Absolute: 0 K/uL (ref 0.0–0.1)
Basophils Relative: 1 %
Eosinophils Absolute: 0.1 K/uL (ref 0.0–0.5)
Eosinophils Relative: 2 %
HCT: 34.8 % — ABNORMAL LOW (ref 36.0–46.0)
Hemoglobin: 11.3 g/dL — ABNORMAL LOW (ref 12.0–15.0)
Immature Granulocytes: 0 %
Lymphocytes Relative: 34 %
Lymphs Abs: 1.8 K/uL (ref 0.7–4.0)
MCH: 27.2 pg (ref 26.0–34.0)
MCHC: 32.5 g/dL (ref 30.0–36.0)
MCV: 83.9 fL (ref 80.0–100.0)
Monocytes Absolute: 0.3 K/uL (ref 0.1–1.0)
Monocytes Relative: 6 %
Neutro Abs: 3.2 K/uL (ref 1.7–7.7)
Neutrophils Relative %: 57 %
Platelet Count: 422 K/uL — ABNORMAL HIGH (ref 150–400)
RBC: 4.15 MIL/uL (ref 3.87–5.11)
RDW: 15.1 % (ref 11.5–15.5)
WBC Count: 5.5 K/uL (ref 4.0–10.5)
nRBC: 0 % (ref 0.0–0.2)

## 2024-02-11 LAB — CMP (CANCER CENTER ONLY)
ALT: 13 U/L (ref 0–44)
AST: 20 U/L (ref 15–41)
Albumin: 4 g/dL (ref 3.5–5.0)
Alkaline Phosphatase: 76 U/L (ref 38–126)
Anion gap: 8 (ref 5–15)
BUN: 7 mg/dL (ref 6–20)
CO2: 26 mmol/L (ref 22–32)
Calcium: 9.5 mg/dL (ref 8.9–10.3)
Chloride: 104 mmol/L (ref 98–111)
Creatinine: 0.55 mg/dL (ref 0.44–1.00)
GFR, Estimated: >60 mL/min (ref >60)
Glucose, Bld: 78 mg/dL (ref 70–99)
Potassium: 4.9 mmol/L (ref 3.5–5.1)
Sodium: 138 mmol/L (ref 135–145)
Total Bilirubin: 0.2 mg/dL (ref 0.0–1.2)
Total Protein: 7.2 g/dL (ref 6.5–8.1)

## 2024-02-11 LAB — IRON AND IRON BINDING CAPACITY (CC-WL,HP ONLY)
Iron: 27 ug/dL — ABNORMAL LOW (ref 28–170)
Saturation Ratios: 7 % — ABNORMAL LOW (ref 10.4–31.8)
TIBC: 391 ug/dL (ref 250–450)
UIBC: 364 ug/dL

## 2024-02-11 LAB — FERRITIN: Ferritin: 11 ng/mL (ref 11–307)

## 2024-02-11 NOTE — Progress Notes (Signed)
 Initial RN Navigator Patient Visit  Name: Amy Scott Date of Referral : 02/04/2024 Diagnosis: Rectal Cancer  Met with patient prior to their visit with MD. [name] Your Patient Navigator handout which explains my role, areas in which I am able to help, and all the contact information for myself and the office. Also gave patient MD and Navigator business card. Reviewed with patient the general overview of expected course after initial diagnosis and time frame for all steps to be completed.  New patient packet given to patient which includes: orientation to office and staff; campus directory; education on My Chart and Advance Directives; and patient centered education on rectal cancer.   Patient comes in on her own. She was fired from her job with her hospitalization. She has four children. No involved fathers. She lives with her sister. No additional family to help. She is strained financially and doesn't know how to improve this. [name] is already working with patient. Food bag and samples of Ensure given to patient.   She qualifies for genetics. Pamphlet given and reviewed. She will think about this.   She is scheduled for staging MRI on Monday. Radiation simulation next week.   Dr Timmy would like MSI completed on patient's path. Message sent. Lab notified that they cannot run on tissue specimen and they need a lavender tube of blood for testing. Drew lavender tube for pathology and courier called to pick up sample and bring to [facility] Pathology Department.   Patient completed visit with Dr. Timmy. He would like a PET and a port. Orders placed.   Patient understands all follow up procedures and expectations. They have my number to reach out for any further clarification or additional needs.   Oncology Nurse Navigator Documentation     02/11/2024   11:00 AM  Oncology Nurse Navigator Flowsheets  Navigator Follow Up Date: 02/14/2024  Navigator Follow Up Reason: Scan Review   Navigator Location [facility]  Navigator Encounter Type Initial MedOnc  Patient Visit Type MedOnc  Treatment Phase Pre-Tx/Tx Discussion  Barriers/Navigation Needs Coordination of Care;Education  Interventions Coordination of Care;Education;Psycho-Social Support;Referrals  Acuity Level 2-Minimal Needs (1-2 Barriers Identified)  Referrals Nutrition/dietician  Coordination of Care Other  Education Method Verbal;Written  Support Groups/Services Friends and Family  Time Spent with Patient 60

## 2024-02-11 NOTE — Telephone Encounter (Signed)
 Spoke with patient regarding the [employer].  Explained how the grant works and that it is a Environmental health practitioner.  Made patient aware that I need her to sign the Patient Acknowledgement Form and a copy of her EBT card.  I am mailing the Patient Acknowledgement Form to the patient and patient is to sign and return along with requested financial documentation to [facility].  Amy Scott Patient Pharmacologist [facility]

## 2024-02-11 NOTE — Progress Notes (Signed)
 Referral MD  Reason for Referral: Rectal cancer -- locally advanced  Chief Complaint  Patient presents with   New Patient (Initial Visit)  : I have rectal cancer.  HPI: Amy Scott is a very nice 33 year old African-American female.  She has 4 children.  She has been fairly healthy.  Unfortunately, she presented to the ER at [facility] in early September.  She had some worsening abdominal pain.  She been having abdominal pain for about a month or so.  She had decreased appetite.  She noticed a change in her bowel habits.  She initially underwent CT scan think she had a kidney stone.  Unfortunate, this showed bowel wall thickening and inflammatory changes in the mid to high rectum.  It was felt that there was partial large bowel obstruction.  She had perirectal adenopathy.  I think she was admitted.  She underwent a flexible sigmoidoscopy on 02/03/2024.  There was a new obstructing rectal mass.  Biopsies were done.  The pathology report ([id number]) showed an invasive moderately differentiated adenocarcinoma.  It was MMR proficient and MSI normal.  Her labs at the time showed a sodium 142.  Potassium 3.4.  BUN 9 creatinine 0.67.  Calcium 9.1 with an albumin of 4.1.  Her CBC showed a white cell count of 9.1.  Hemoglobin 11.1.  Platelet count 326,000.  She has CT of the body on 02/04/2024.  This showed an ulcerated cervical rectal mass involving the distal rectosigmoid colon.  She had perirectal and presacral lymph nodes.  Liver looks fine.  There is nothing in the thoracic cavity.  She had a MRI of the rectum.  This was done 02/04/2024.  This unfortunately was halted because she cannot tolerate rectal gel insertion.  What was noted was transmural spread into the right mesorectum and sigmoid mesocolon.  She had pelvic adenopathy.  She has seen Amy Scott of Radiation Oncology.  She felt that radiation therapy would be needed to help prevent obstruction.  General surgery saw her.  They did  not feel that she was a candidate for resection because of the extent of disease.  She is due to have radiation therapy to start next week or so.  She looks quite good.  She has no obvious family history of cancer.  She does not smoke.  She has no history of alcohol use.  There is been no cough.  She has had no leg swelling.  She has had no rashes.  Overall, I would say her performance status is probably ECOG 1.   Past Medical History:  Diagnosis Date   Headache(784.0)    No pertinent past medical history   :   Past Surgical History:  Procedure Laterality Date   APPENDECTOMY     FLEXIBLE SIGMOIDOSCOPY N/A 02/03/2024   Procedure: KINGSTON SIDE;  Surgeon: Amy Dover, MD;  Location: [facility];  Service: Gastroenterology;  Laterality: N/A;  rectal bleeding/abnormal CT scan.   MOUTH SURGERY    :   Current Outpatient Medications:    feeding supplement (ENSURE PLUS HIGH PROTEIN) LIQD, Take 237 mLs by mouth 2 (two) times daily between meals., Disp: 5000 mL, Rfl: 0   oxyCODONE  (OXY IR/ROXICODONE ) 5 MG immediate release tablet, Take 1 tablet (5 mg total) by mouth every 6 (six) hours as needed for moderate pain (pain score 4-6)., Disp: 30 tablet, Rfl: 0   polyethylene glycol powder (GLYCOLAX /MIRALAX ) 17 GM/SCOOP powder, Take 17 g dissolved in liquid by mouth 2 (two) times daily., Disp: 238 g,  Rfl: 0:  :   Allergies  Allergen Reactions   Morphine And Codeine Hives, Shortness Of Breath, Itching and Swelling  :  No family history on file.:   Social History   Socioeconomic History   Marital status: Single    Spouse name: Not on file   Number of children: Not on file   Years of education: Not on file   Highest education level: Not on file  Occupational History   Not on file  Tobacco Use   Smoking status: Former    Types: Cigarettes   Smokeless tobacco: Never  Vaping Use   Vaping status: Never Used  Substance and Sexual Activity   Alcohol use: No   Drug use:  No   Sexual activity: Yes  Other Topics Concern   Not on file  Social History Narrative   Not on file   Social Drivers of Health   Financial Resource Strain: Not on file  Food Insecurity: No Food Insecurity (02/11/2024)   Hunger Vital Sign    Worried About Running Out of Food in the Last Year: Never true    Ran Out of Food in the Last Year: Never true  Transportation Needs: No Transportation Needs (02/11/2024)   PRAPARE - Administrator, Civil Service (Medical): No    Lack of Transportation (Non-Medical): No  Physical Activity: Not on file  Stress: Not on file  Social Connections: Not on file  Intimate Partner Violence: Not At Risk (02/11/2024)   Humiliation, Afraid, Rape, and Kick questionnaire    Fear of Current or Ex-Partner: No    Emotionally Abused: No    Physically Abused: No    Sexually Abused: No  :  Review of Systems  Constitutional:  Positive for weight loss.  HENT: Negative.    Eyes: Negative.   Respiratory: Negative.    Cardiovascular: Negative.   Gastrointestinal: Negative.   Genitourinary: Negative.   Musculoskeletal: Negative.   Skin: Negative.   Neurological: Negative.   Endo/Heme/Allergies: Negative.   Psychiatric/Behavioral: Negative.       Exam:  Vital signs show temperature of 98.  Pulse 62.  Blood pressure 106/66.  Weight is 103 pounds. @IPVITALS @ Physical Exam Vitals reviewed.  HENT:     Head: Normocephalic and atraumatic.  Eyes:     Pupils: Pupils are equal, round, and reactive to light.  Cardiovascular:     Rate and Rhythm: Normal rate and regular rhythm.     Heart sounds: Normal heart sounds.  Pulmonary:     Effort: Pulmonary effort is normal.     Breath sounds: Normal breath sounds.  Abdominal:     General: Bowel sounds are normal.     Palpations: Abdomen is soft.  Musculoskeletal:        General: No tenderness or deformity. Normal range of motion.     Cervical back: Normal range of motion.  Lymphadenopathy:      Cervical: No cervical adenopathy.  Skin:    General: Skin is warm and dry.     Findings: No erythema or rash.  Neurological:     Mental Status: She is alert and oriented to person, place, and time.  Psychiatric:        Behavior: Behavior normal.        Thought Content: Thought content normal.        Judgment: Judgment normal.     Recent Labs    02/11/24 1115  WBC 5.5  HGB 11.3*  HCT 34.8*  PLT  422*    Recent Labs    02/11/24 1115  NA 138  K 4.9  CL 104  CO2 26  GLUCOSE 78  BUN 7  CREATININE 0.55  CALCIUM 9.5    Blood smear review: None  Pathology: See above    Assessment and Plan: Ms. Spickler is a very nice 33 year old Afro-American female.  She has probably at least stage III rectal cancer.  I really think we need to get a dedicated rectal MRI and probably also a PET scan to see if she does have any evidence of metastatic disease.  She is quite young.  I hate that she is so young and has this problem.  Ultimately, our goal is curative resection.  She is going start radiotherapy in a week or so.  We really need to give aggressive chemotherapy with radiotherapy.  I think that it be worthwhile to give her FOLFOX during radiotherapy.  I think that we can hopefully get a quick tumor reduction with the chemo radiation therapy.  I do long talk with her.  Again, I told her that we need to know what her stage is.  At this will tell us  what the prognosis will be.  She understands this.  I told her about chemotherapy.  I talked to her about the benefits of chemotherapy with radiation therapy.  She understands this and agrees to this.  She will need to have a Port-A-Cath placed.  She has got another MRI of the rectum for staging next week.  We will try to get a PET scan on her next week also.  Again, I really think that she would do okay with aggressive chemotherapy with radiotherapy.  We will try to get chemotherapy started on 02/22/2024.  I would like to see her  back when she starts her second cycle of chemotherapy.

## 2024-02-11 NOTE — Progress Notes (Signed)
 START ON PATHWAY REGIMEN - Colorectal     A cycle is every 14 days:     Oxaliplatin      Leucovorin      Fluorouracil      Fluorouracil   **Always confirm dose/schedule in your pharmacy ordering system**  Patient Characteristics: Preoperative or Nonsurgical Candidate, M0 (Clinical Staging), Rectal, cT2, cN1 or cT3, cN0-1, and Candidate for Sphincter-sparing Surgery Tumor Location: Rectal Therapeutic Status: Preoperative or Nonsurgical Candidate, M0 (Clinical Staging) AJCC T Category: cTX AJCC N Category: cNX AJCC M Category: cM0 AJCC 8 Stage Grouping: IIIC Intent of Therapy: Curative Intent, Discussed with Patient

## 2024-02-13 ENCOUNTER — Other Ambulatory Visit: Payer: Self-pay

## 2024-02-14 ENCOUNTER — Ambulatory Visit (HOSPITAL_COMMUNITY)
Admission: RE | Admit: 2024-02-14 | Discharge: 2024-02-14 | Disposition: A | Discharge status: Home / Self Care | Source: Ambulatory Visit | Attending: Radiation Oncology | Admitting: Radiation Oncology

## 2024-02-14 ENCOUNTER — Encounter: Payer: Self-pay | Admitting: Hematology & Oncology

## 2024-02-14 DIAGNOSIS — C775 Secondary and unspecified malignant neoplasm of intrapelvic lymph nodes: Secondary | ICD-10-CM | POA: Insufficient documentation

## 2024-02-14 DIAGNOSIS — C20 Malignant neoplasm of rectum: Secondary | ICD-10-CM | POA: Insufficient documentation

## 2024-02-15 NOTE — Progress Notes (Signed)
 REFERRING PHYSICIAN:  Self  PROVIDER:  LONNI OZELL PIZZA, MD  MRN: [id number] DOB: 1990/08/09 DATE OF ENCOUNTER: 02/15/2024  Subjective   Chief Complaint: Rectal cancer   History of Present Illness: Amy Scott is a 33 y.o. female, whom is seen in the office today for long-term follow-up evaluation of rectal cancer.    She had been admitted to the hospital 02/02/2024 after having presented to the med center with lower abdominal discomfort for the last 4 weeks as well as some bleeding.  She underwent workup and was found to have circumferential bowel wall thickening of the rectum on her CT scan with extensive perirectal inflammatory stranding involving the mid and upper rectum.  Possible nodular soft tissue within the colorectal mesentery, perirectal adenopathy and question of resultant partial large bowel obstruction.  She underwent workup in the hospital  Flex sig 02/03/24 - Dr. Rollin -  -Malignant partially obstructing tumor in the rectosigmoid colon.  Biopsied.  PATH A. COLON, RECTOSIGMOID, BIOPSY:  - Invasive moderately differentiated adenocarcinoma, see comment    MRI Pelvis 02/04/24 IMPRESSION: 1. The exam was halted because patient could not tolerate rectal gel insertion. A limited exam was performed as detailed above. 2. Rectosigmoid primary, suboptimally evaluated. Probable transmural spread into the right mesorectum and sigmoid mesocolon. 3. Pelvic adenopathy, highly suspicious for nodal metastasis. 4. When the patient is clinically stable (i.e. As an outpatient), dedicated staging MRI should be considered.  CT CAP 02/04/24 1. Ulcerated circumferential mass involving the distal rectosigmoid colon, corresponding to biopsy-proven malignancy. 2. Subcentimeter perirectal and presacral lymph nodes concerning for nodal metastases. No pathologic adenopathy elsewhere within the abdomen or pelvis. 3. No evidence of distant metastases.  Repeat pelvic MRI  02/14/2024: Pending read  She is now following with Dr. Timmy with medical oncology.  She has been doing well.  She is here today with her significant other.  She denies any complaints.  She is taking MiraLAX  and denies any abdominal pain, cramping, or constipation.  No nausea or vomiting.  PMH: Denies  PSH: Laparoscopic appendectomy otherwise denies abdominal or pelvic surgical history  FHx: Denies any known family history of colorectal, breast, endometrial or ovarian cancer  Social Hx: Denies use of tobacco/EtOH; she is here today with her significant other.   Review of Systems: A complete review of systems was obtained from the patient.  I have reviewed this information and discussed as appropriate with the patient.  See HPI as well for other ROS.  All of the below systems have been reviewed with the patient and positives are indicated with bold text General: chills, fever or night sweats Eyes: blurry vision or double vision ENT: epistaxis or sore throat Allergy/Immunology: itchy/watery eyes or nasal congestion Hematologic/Lymphatic: bleeding problems, blood clots or swollen lymph nodes Endocrine: temperature intolerance or unexpected weight changes Breast: new or changing breast lumps or nipple discharge Resp: cough, shortness of breath, or wheezing CV: chest pain or dyspnea on exertion GI: as per HPI GU: dysuria, trouble voiding, or hematuria MSK: joint pain or joint stiffness Neuro: TIA or stroke symptoms Derm: pruritus and skin lesion changes Psych: anxiety and depression   Medical History: History reviewed. No pertinent past medical history.  There is no problem list on file for this patient.   Past Surgical History:  Procedure Laterality Date  . FLEXIBLE SIGMOIDOSCOPY  02/03/2024  . APPENDECTOMY       Allergies  Allergen Reactions  . Morphine Swelling    Current Outpatient Medications on File  Prior to Visit  Medication Sig Dispense Refill  . oxyCODONE   (ROXICODONE ) 5 MG immediate release tablet Take 5 mg by mouth    . polyethylene glycol (MIRALAX ) powder Take 17 g by mouth     No current facility-administered medications on file prior to visit.    History reviewed. No pertinent family history.   Social History   Tobacco Use  Smoking Status Former  . Types: Cigarettes  Smokeless Tobacco Never     Social History   Socioeconomic History  . Marital status: Single  Tobacco Use  . Smoking status: Former    Types: Cigarettes  . Smokeless tobacco: Never  Vaping Use  . Vaping status: Never Used  Substance and Sexual Activity  . Alcohol use: Never  . Drug use: Yes    Types: Other-see comments    Comment: sweet leaves   Social Drivers of Health   Food Insecurity: No Food Insecurity (02/11/2024)   Received from [facility]   Hunger Vital Sign   . Within the past 12 months, you worried that your food would run out before you got the money to buy more.: Never true   . Within the past 12 months, the food you bought just didn't last and you didn't have money to get more.: Never true  Transportation Needs: No Transportation Needs (02/11/2024)   Received from [facility] - Transportation   . In the past 12 months, has lack of transportation kept you from medical appointments or from getting medications?: No   . In the past 12 months, has lack of transportation kept you from meetings, work, or from getting things needed for daily living?: No    Objective:    Vitals:   02/15/24 1009  BP: 108/70  Pulse: 64  Temp: 36.6 C (97.9 F)  Weight: 46.2 kg (101 lb 12.8 oz)  Height: 162.6 cm (5' 4)  PainSc: 0-No pain    Body mass index is 17.47 kg/m.  Constitutional: NAD; conversant Eyes: Moist conjunctiva; anicteric Lungs: Normal respiratory effort CV: RRR; no pitting edema GI: Abdomen is soft, NT/ND; no palpable hepatosplenomegaly Psychiatric: Appropriate affect  Assessment and Plan:  Diagnoses and all orders for  this visit:  Rectal cancer (CMS/HHS-HCC)     Amy Scott is a very pleasant 33 y.o. female with presumed locally advanced mid and upper rectal cancer   - We spent time reviewing the relevant anatomy and physiology of the GI tract and pathophysiology of colorectal cancer.  She is following with medical oncology with plans underway for chemo and chemoradiation.  We will discuss her case at our multidisciplinary tumor board.  - We did also give her an overview of the surgical approach to this after upfront chemo and chemoradiation therapy.  We discussed surgery remains a integral part of her potential care here.  We discussed robotic assisted low anterior resection with diverting loop ileostomy.  We discussed that in many cases these ileostomy's are temporary but left in place for on average 3 months time.  We discussed if she recovers and heals well, she would be a candidate for potential reversal following this.  We worked to give her an overview of what an ileostomy is and general expectations therein.  All of her questions were answered, she expressed understanding, and agreement the plan.  - We will plan to see her back follow-up in approximately 3 months or sooner if any other issues or concerns arise.  -I have reached out to Dr.  Ennever regarding timeline given planned neoadjuvant approach.  Return in about 3 months (around 05/16/2024).  I spent a total of 40 minutes today in both face-to-face and non-face-to-face activities to perform the following: review records, take history, perform exam, counsel the patient on the diagnosis, and document encounter, findings, and plan in the EHR  for this visit on the date of this encounter.  Lonni Pizza, MD [facility], [facility]

## 2024-02-16 ENCOUNTER — Ambulatory Visit
Admission: RE | Admit: 2024-02-16 | Discharge: 2024-02-16 | Disposition: A | Discharge status: Home / Self Care | Source: Ambulatory Visit | Attending: Radiation Oncology

## 2024-02-16 ENCOUNTER — Other Ambulatory Visit: Payer: Self-pay | Admitting: Radiation Oncology

## 2024-02-16 ENCOUNTER — Encounter: Payer: Self-pay | Admitting: *Deleted

## 2024-02-16 ENCOUNTER — Ambulatory Visit
Admission: RE | Admit: 2024-02-16 | Discharge: 2024-02-16 | Disposition: A | Discharge status: Home / Self Care | Source: Ambulatory Visit | Attending: Radiation Oncology | Admitting: Radiation Oncology

## 2024-02-16 DIAGNOSIS — C19 Malignant neoplasm of rectosigmoid junction: Secondary | ICD-10-CM

## 2024-02-16 MED ORDER — OXYCODONE HCL 5 MG PO TABS: 5.0000 mg | ORAL_TABLET | Freq: Four times a day (QID) | ORAL | 0 refills | Status: DC | PRN

## 2024-02-16 MED ORDER — ACETAMINOPHEN EXTRA STRENGTH 500 MG PO CAPS: 2.0000 | ORAL_CAPSULE | Freq: Three times a day (TID) | ORAL | 1 refills | Status: AC | PRN

## 2024-02-16 MED ORDER — SODIUM CHLORIDE FLUSH 0.9 % IV SOLN: 10.0000 mL | Freq: Once | INTRAVENOUS | Status: DC

## 2024-02-16 MED ORDER — POLYETHYLENE GLYCOL 3350 17 GM/SCOOP PO POWD: 17.0000 g | Freq: Two times a day (BID) | ORAL | 0 refills | Status: AC

## 2024-02-16 MED ORDER — IBUPROFEN 800 MG PO TABS: 800.0000 mg | ORAL_TABLET | Freq: Three times a day (TID) | ORAL | 0 refills | Status: DC | PRN

## 2024-02-16 MED ORDER — SODIUM CHLORIDE 0.9% FLUSH: 10.0000 mL | Freq: Once | INTRAVENOUS | Status: DC

## 2024-02-16 NOTE — Progress Notes (Signed)
 Pharmacist Chemotherapy Monitoring - Initial Assessment    Anticipated start date: 02/23/24   The following has been reviewed per standard work regarding the patient's treatment regimen: The patient's diagnosis, treatment plan and drug doses, and organ/hematologic function Lab orders and baseline tests specific to treatment regimen  The treatment plan start date, drug sequencing, and pre-medications Prior authorization status  Patient's documented medication list, including drug-drug interaction screen and prescriptions for anti-emetics and supportive care specific to the treatment regimen The drug concentrations, fluid compatibility, administration routes, and timing of the medications to be used The patient's access for treatment and lifetime cumulative dose history, if applicable  The patient's medication allergies and previous infusion related reactions, if applicable   Changes made to treatment plan:  treatment plan date  Follow up needed:  N/A   Amy Scott, Olam Browning, [facility], 02/16/2024  8:49 AM

## 2024-02-16 NOTE — Progress Notes (Signed)
 Reviewed MRI which shows metastatic mesorectal lymphadenopathy. She has radiation simulation today. Treatment to start 02/23/2024.  Calendar with all appointments printed, highlighted and information about all appointments including location, prep, etc completed and mailed to patient home.   Oncology Nurse Navigator Documentation     02/16/2024    1:45 PM  Oncology Nurse Navigator Flowsheets  Navigator Follow Up Date: 02/23/2024  Navigator Follow Up Reason: Follow-up Appointment;Chemotherapy  Navigator Location [facility]  Navigator Encounter Type Appt/Treatment Plan Review;Letter/Fax/Email;Scan Review  Patient Visit Type MedOnc  Treatment Phase Pre-Tx/Tx Discussion  Barriers/Navigation Needs Coordination of Care;Education  Education Other  Interventions Coordination of Care;Education  Acuity Level 2-Minimal Needs (1-2 Barriers Identified)  Education Method Written  Support Groups/Services Friends and Family  Time Spent with Patient 30

## 2024-02-17 ENCOUNTER — Ambulatory Visit (HOSPITAL_BASED_OUTPATIENT_CLINIC_OR_DEPARTMENT_OTHER): Admitting: Family Medicine

## 2024-02-21 ENCOUNTER — Other Ambulatory Visit: Payer: Self-pay | Admitting: Hematology & Oncology

## 2024-02-21 ENCOUNTER — Other Ambulatory Visit: Payer: Self-pay | Admitting: Radiology

## 2024-02-21 ENCOUNTER — Encounter: Payer: Self-pay | Admitting: Radiation Oncology

## 2024-02-21 DIAGNOSIS — C19 Malignant neoplasm of rectosigmoid junction: Secondary | ICD-10-CM

## 2024-02-21 NOTE — H&P (Signed)
 Chief Complaint: Locally advanced rectal cancer; referred for Port-A-Cath placement to assist with treatment    Referring Provider(s): Ennever,P  Supervising Physician: Vanice Revel  Patient Status: [facility] - Out-pt  History of Present Illness: Amy Scott is a 33 y.o. female ex smoker with history of newly diagnosed rectal cancer who presents today for Port-A-Cath placement to assist with treatment.   Patient is Full Code  Past Medical History:  Diagnosis Date   Headache(784.0)    No pertinent past medical history     Past Surgical History:  Procedure Laterality Date   APPENDECTOMY     FLEXIBLE SIGMOIDOSCOPY N/A 02/03/2024   Procedure: KINGSTON SIDE;  Surgeon: Rollin Dover, MD;  Location: [facility];  Service: Gastroenterology;  Laterality: N/A;  rectal bleeding/abnormal CT scan.   MOUTH SURGERY      Allergies: Morphine and codeine  Medications: Prior to Admission medications   Medication Sig Start Date End Date Taking? Authorizing Provider  Acetaminophen  Extra Strength 500 MG CAPS Take 2 tablets by mouth 3 (three) times daily as needed. DO NOT EXCEED 6 PILLS OR 3000 MG IN A 24 HOUR PERIOD. 02/16/24   Maritza Stagger, MD  feeding supplement (ENSURE PLUS HIGH PROTEIN) LIQD Take 237 mLs by mouth 2 (two) times daily between meals. 02/07/24   Jillian Buttery, MD  ibuprofen  (ADVIL ) 800 MG tablet Take 1 tablet (800 mg total) by mouth 3 (three) times daily as needed. 02/16/24   Maritza Stagger, MD  oxyCODONE  (OXY IR/ROXICODONE ) 5 MG immediate release tablet Take 1-2 tablets (5-10 mg total) by mouth every 6 (six) hours as needed for moderate pain (pain score 4-6). 02/16/24   Lanell Donald Stagger, PA-C  polyethylene glycol powder (GLYCOLAX /MIRALAX ) 17 GM/SCOOP powder Take 17 g dissolved in liquid by mouth 2 (two) times daily. 02/16/24   Maritza Stagger, MD     No family history on file.  Social History   Socioeconomic History   Marital status: Single    Spouse name:  Not on file   Number of children: Not on file   Years of education: Not on file   Highest education level: Not on file  Occupational History   Not on file  Tobacco Use   Smoking status: Former    Types: Cigarettes   Smokeless tobacco: Never  Vaping Use   Vaping status: Never Used  Substance and Sexual Activity   Alcohol use: No   Drug use: No   Sexual activity: Yes  Other Topics Concern   Not on file  Social History Narrative   Not on file   Social Drivers of Health   Financial Resource Strain: Not on file  Food Insecurity: No Food Insecurity (02/11/2024)   Hunger Vital Sign    Worried About Running Out of Food in the Last Year: Never true    Ran Out of Food in the Last Year: Never true  Transportation Needs: No Transportation Needs (02/11/2024)   PRAPARE - Administrator, Civil Service (Medical): No    Lack of Transportation (Non-Medical): No  Physical Activity: Not on file  Stress: Not on file  Social Connections: Not on file       Review of Systems: Currently denies fever, headache, chest pain, dyspnea, cough, back pain, nausea, vomiting or bleeding.  She does have some anterior pelvic discomfort  Vital Signs: Vitals:   02/22/24 1244  BP: 111/73  Resp: 18  Temp: 98.3 F (36.8 C)  SpO2: 100%    LMP 01/27/2024 (Exact  Date)   Advance Care Plan: No documents on file  Physical Exam: Awake, alert.  Chest clear to auscultation bilaterally.  Heart with regular rate and rhythm.  Abdomen soft, positive bowel sounds,  mildly tender suprapubic region to palpation.  No lower extremity edema.  Imaging: MR PELVIS WO CM RECTAL CA STAGING Result Date: 02/15/2024 CLINICAL DATA:  Rectal cancer staging EXAM: MRI PELVIS WITHOUT CONTRAST TECHNIQUE: Multiplanar multisequence MR imaging of the pelvis was performed. No intravenous contrast was administered. COMPARISON:  MR pelvis February 04, 2024, CT chest abdomen pelvis February 04, 2024. FINDINGS: Exam is suboptimal  given the lack of contrast. Tumor location and characteristics: Circumferential rectal wall thickening consistent with known malignancy involving the mid rectum spanning about 4.5 cm, distal margin about 8.5 cm to the anal verge. Clock face: Nearly circumferential 9 to 6 o'clock. Restrictive diffusion restriction in tumor/tumor bed (DWI): Likely present Extramural depth of invasion:0 mm. Relationship of the tumor to the mesorectal fascia : MRF  negative Extramural Vascular Invasion (EMVI): Absent Mesorectal lymph nodes and tumor deposits: N2: > 4 metastatic mesorectal lymph nodes. (At least 8 bilateral mesorectal lymph nodes measuring up to 4.5 mm) Reproductive: Normal uterus and ovaries. Left ovary dominant follicle measuring 2.4 cm. Urinary Tract: Bladder is unremarkable. Musculoskeletal: No suspicious osseous lesion to suggest metastatic disease. IMPRESSION: Mid rectal malignancy with metastatic mesorectal lymphadenopathy. Recommend future follow-ups with contrast-enhanced MR pelvis. Electronically Signed   By: Megan  Zare M.D.   On: 02/15/2024 14:42   CT CHEST ABDOMEN PELVIS W CONTRAST Result Date: 02/04/2024 CLINICAL DATA:  Rectal cancer, staging EXAM: CT CHEST, ABDOMEN, AND PELVIS WITH CONTRAST TECHNIQUE: Multidetector CT imaging of the chest, abdomen and pelvis was performed following the standard protocol during bolus administration of intravenous contrast. RADIATION DOSE REDUCTION: This exam was performed according to the departmental dose-optimization program which includes automated exposure control, adjustment of the mA and/or kV according to patient size and/or use of iterative reconstruction technique. CONTRAST:  85mL OMNIPAQUE  IOHEXOL  300 MG/ML  SOLN COMPARISON:  02/04/2024, 02/02/2024 FINDINGS: CT CHEST FINDINGS Cardiovascular: The heart is unremarkable without pericardial effusion. No evidence of thoracic aortic aneurysm or dissection. Mediastinum/Nodes: No enlarged mediastinal, hilar, or axillary  lymph nodes. Thyroid gland, trachea, and esophagus demonstrate no significant findings. Lungs/Pleura: No acute airspace disease, effusion, or pneumothorax. Central airways are patent. Musculoskeletal: No acute or destructive bony abnormalities. Reconstructed images demonstrate no additional findings. CT ABDOMEN PELVIS FINDINGS Hepatobiliary: Subcentimeter subcapsular right lobe liver cyst image 55/2. No other focal liver abnormalities. No biliary duct dilation. The gallbladder is unremarkable. Pancreas: Unremarkable. No pancreatic ductal dilatation or surrounding inflammatory changes. Spleen: Normal in size without focal abnormality. Adrenals/Urinary Tract: The kidneys enhance normally and symmetrically. No urinary tract calculi or obstruction. The adrenals and bladder are unremarkable. Stomach/Bowel: There is circumferential mural thickening within the distal colon at the rectosigmoid junction, corresponding to the known malignancy. There is a 1.3 x 1.8 x 1.2 cm intramural fluid collection reference image 101/2, which may reflect ulceration within the circumferential mass. No evidence of perforation. No bowel obstruction or ileus. Vascular/Lymphatic: Subcentimeter perirectal and presacral lymph nodes are again identified, measuring 7 mm image 103/2 and 6 mm image 108/2. No pathologically enlarged lymph nodes are observed. No significant vascular findings. Reproductive: Uterus and bilateral adnexa are unremarkable. Other: Trace pelvic free fluid. No free intraperitoneal gas. No abdominal wall hernia. Musculoskeletal: No acute or destructive bony abnormalities. Reconstructed images demonstrate no additional findings. IMPRESSION: 1. Ulcerated circumferential mass involving the distal  rectosigmoid colon, corresponding to biopsy-proven malignancy. 2. Subcentimeter perirectal and presacral lymph nodes concerning for nodal metastases. No pathologic adenopathy elsewhere within the abdomen or pelvis. 3. No evidence of  distant metastases. Electronically Signed   By: Ozell Daring M.D.   On: 02/04/2024 20:18   MR PELVIS WO CONTRAST Result Date: 02/04/2024 CLINICAL DATA:  Rectosigmoid mass on CT EXAM: MRI PELVIS WITHOUT CONTRAST TECHNIQUE: Multiplanar multisequence MR imaging of the pelvis was performed. No intravenous contrast was administered. COMPARISON:  CT 02/02/2024 FINDINGS: The patient was unable to tolerate rectal gel and therefore decision was made to hold the exam and repeat when patient is more clinically stable (as an outpatient). Limited T2 and diffusion-weighted imaging was performed. Urinary Tract:  Normal urinary bladder.  No distal hydroureter Bowel: The rectosigmoid junction primary is identified including on 14/3. Suboptimally evaluated secondary to underdistention. There is likely transmural extension into the right mesorectum and sigmoid mesocolon including on 13/3. Vascular/Lymphatic: No pelvic aneurysm. Perirectal adenopathy including 5 mm perirectal node of on 17/3. A presacral node measures 6 mm on 12/03. Reproductive:  Normal uterus and adnexa. Other:  Trace free pelvic fluid is likely physiologic. Musculoskeletal: No acute osseous abnormality. IMPRESSION: 1. The exam was halted because patient could not tolerate rectal gel insertion. A limited exam was performed as detailed above. 2. Rectosigmoid primary, suboptimally evaluated. Probable transmural spread into the right mesorectum and sigmoid mesocolon. 3. Pelvic adenopathy, highly suspicious for nodal metastasis. 4. When the patient is clinically stable (i.e. As an outpatient), dedicated staging MRI should be considered. Electronically Signed   By: Rockey Kilts M.D.   On: 02/04/2024 16:03   CT Renal Stone Study Result Date: 02/02/2024 CLINICAL DATA:  Right lower quadrant abdominal pain, history of appendectomy EXAM: CT ABDOMEN AND PELVIS WITHOUT CONTRAST TECHNIQUE: Multidetector CT imaging of the abdomen and pelvis was performed following the  standard protocol without IV contrast. RADIATION DOSE REDUCTION: This exam was performed according to the departmental dose-optimization program which includes automated exposure control, adjustment of the mA and/or kV according to patient size and/or use of iterative reconstruction technique. COMPARISON:  None Available. FINDINGS: Lower chest: No acute abnormality. Hepatobiliary: No focal liver abnormality is seen. No gallstones, gallbladder wall thickening, or biliary dilatation. Pancreas: Unremarkable Spleen: Unremarkable Adrenals/Urinary Tract: The adrenal glands are unremarkable. Fine calcifications are seen within the kidneys bilaterally in keeping medullary nephrocalcinosis. No urinary renal or ureteral calculi are identified. No hydronephrosis. No perinephric inflammatory stranding or fluid collections are seen. Bladder is unremarkable Stomach/Bowel: There is circumferential bowel wall thickening and extensive perirectal inflammatory stranding involving the mid and high rectum with extensive inflammatory changes extending into the presacral space, best appreciated on image # 57/2 and 70/7. There is suggestion of nodular soft tissue within the colorectal mesentery at this level (102/5) which may reflect inflammatory soft tissue/phlegmon. This may be infectious or inflammatory in nature as can be seen with inflammatory bowel disease. However, an infiltrative mass with transmural extension could appear similarly. Extensive perirectal adenopathy is present, possibly reactive though infiltrative disease could appear similarly. There is a resultant partial large bowel obstruction with moderate volume stool seen throughout the more proximal colon. The stomach, small bowel, and large bowel are otherwise unremarkable. Appendix has been removed. No free intraperitoneal gas or fluid Vascular/Lymphatic: The left gonadal vein appears dilated but is not well assessed on this noncontrast examination. The abdominal  vasculature is otherwise unremarkable. No additional pathologic adenopathy identified within the abdomen and pelvis. Reproductive: Uterus and  bilateral adnexa are unremarkable. Other: No abdominal wall hernia Musculoskeletal: No acute or significant osseous findings. IMPRESSION: 1. Circumferential bowel wall thickening and extensive perirectal inflammatory stranding involving the mid and high rectum with extensive inflammatory changes extending into the presacral space. There is suggestion of nodular soft tissue within the colorectal mesentery at this level which may reflect inflammatory soft tissue/phlegmon. This may be infectious or inflammatory in nature as can be seen with inflammatory bowel disease. However, an infiltrative mass with transmural extension could appear similarly. Correlation with endoscopy is recommended. 2. Extensive perirectal adenopathy, possibly reactive though infiltrative disease could appear similarly. 3. Resultant partial large bowel obstruction with moderate volume stool seen throughout the more proximal colon. 4. Medullary nephrocalcinosis. Differential considerations include systemic hypercalcemia, renal tubular acidosis, or hyperparathyroidism Electronically Signed   By: Dorethia Molt M.D.   On: 02/02/2024 02:49    Labs:  CBC: Recent Labs    02/01/24 2338 02/03/24 0533 02/06/24 0930 02/11/24 1115  WBC 9.1 6.9 3.6* 5.5  HGB 11.1* 9.4* 11.0* 11.3*  HCT 33.0* 29.7* 34.6* 34.8*  PLT 326 293 359 422*    COAGS: No results for input(s): INR, APTT in the last 8760 hours.  BMP: Recent Labs    02/01/24 2338 02/03/24 0533 02/11/24 1115  NA 142 137 138  K 3.4* 3.5 4.9  CL 107 107 104  CO2 23 19* 26  GLUCOSE 92 81 78  BUN 9 <5* 7  CALCIUM  9.1 8.4* 9.5  CREATININE 0.67 0.51 0.55  GFRNONAA >60 >60 >60    LIVER FUNCTION TESTS: Recent Labs    02/01/24 2338 02/11/24 1115  BILITOT 0.5 0.2  AST 12* 20  ALT <5 13  ALKPHOS 91 76  PROT 7.4 7.2  ALBUMIN  4.1 4.0    TUMOR MARKERS: No results for input(s): AFPTM, CEA, CA199, CHROMGRNA in the last 8760 hours.  Assessment and Plan: 33 y.o. female ex smoker  with  history of newly diagnosed rectal cancer who presents today for Port-A-Cath placement to assist with treatment.Risks and benefits of image guided port-a-catheter placement was discussed with the patient/boyfriend including, but not limited to bleeding, infection, pneumothorax, or fibrin sheath development and need for additional procedures.  All of the patient's questions were answered, patient is agreeable to proceed. Consent signed and in chart.    Thank you for allowing our service to participate in Amy Scott 's care.  Electronically Signed: D. Franky Rakers, PA-C   02/21/2024, 1:04 PM      I spent a total of  20 minutes   in face to face in clinical consultation, greater than 50% of which was counseling/coordinating care for port a cath placement

## 2024-02-21 NOTE — Progress Notes (Signed)
 Spoke with Dr. Timmy who prefers patient begins her TNT regime with chemotherapy rather than chemoRT. Upfront chemo will be followed by chemoRT for completion of TNT. We will not start her radiation this week as initially planned. We will see her back at the end of chemotherapy for repeat simulation scan. I called patient but unable to reach her as she is having difficulties with her phone. Did get in touch with her sister who will relay message to the patient. Phone number provided in case patient wishes to call me back to discuss further.

## 2024-02-22 ENCOUNTER — Ambulatory Visit (HOSPITAL_COMMUNITY)
Admission: RE | Admit: 2024-02-22 | Discharge: 2024-02-22 | Disposition: A | Discharge status: Home / Self Care | Source: Ambulatory Visit | Attending: Hematology & Oncology | Admitting: Hematology & Oncology

## 2024-02-22 ENCOUNTER — Encounter (HOSPITAL_COMMUNITY): Payer: Self-pay

## 2024-02-22 ENCOUNTER — Other Ambulatory Visit: Payer: Self-pay | Admitting: *Deleted

## 2024-02-22 DIAGNOSIS — C19 Malignant neoplasm of rectosigmoid junction: Secondary | ICD-10-CM

## 2024-02-22 DIAGNOSIS — C20 Malignant neoplasm of rectum: Secondary | ICD-10-CM | POA: Diagnosis present

## 2024-02-22 DIAGNOSIS — Z87891 Personal history of nicotine dependence: Secondary | ICD-10-CM | POA: Insufficient documentation

## 2024-02-22 HISTORY — PX: IR IMAGING GUIDED PORT INSERTION: IMG5740

## 2024-02-22 MED ORDER — SODIUM CHLORIDE 0.9 % IV SOLN: INTRAVENOUS | Status: DC

## 2024-02-22 MED ORDER — FENTANYL CITRATE (PF) 100 MCG/2ML IJ SOLN
INTRAMUSCULAR | Status: AC
  Filled 2024-02-22: qty 2

## 2024-02-22 MED ORDER — PROCHLORPERAZINE MALEATE 10 MG PO TABS: 10.0000 mg | ORAL_TABLET | Freq: Four times a day (QID) | ORAL | 1 refills | Status: DC | PRN

## 2024-02-22 MED ORDER — HEPARIN SOD (PORK) LOCK FLUSH 100 UNIT/ML IV SOLN
500.0000 [IU] | Freq: Once | INTRAVENOUS | Status: AC
Start: 2024-02-22 — End: 2024-02-22
  Administered 2024-02-22: 500 [IU] via INTRAVENOUS

## 2024-02-22 MED ORDER — LIDOCAINE-EPINEPHRINE 1 %-1:100000 IJ SOLN
20.0000 mL | Freq: Once | INTRAMUSCULAR | Status: AC
  Administered 2024-02-22: 20 mL via INTRADERMAL

## 2024-02-22 MED ORDER — LOPERAMIDE HCL 2 MG PO CAPS: ORAL_CAPSULE | ORAL | 3 refills | Status: AC

## 2024-02-22 MED ORDER — HEPARIN SOD (PORK) LOCK FLUSH 100 UNIT/ML IV SOLN
INTRAVENOUS | Status: AC
Start: 2024-02-22 — End: 2024-02-22
  Filled 2024-02-22: qty 5

## 2024-02-22 MED ORDER — ONDANSETRON HCL 8 MG PO TABS: 8.0000 mg | ORAL_TABLET | Freq: Three times a day (TID) | ORAL | 1 refills | Status: AC | PRN

## 2024-02-22 MED ORDER — DEXAMETHASONE 4 MG PO TABS: 8.0000 mg | ORAL_TABLET | Freq: Every day | ORAL | 1 refills | Status: DC

## 2024-02-22 MED ORDER — MIDAZOLAM HCL 2 MG/2ML IJ SOLN
INTRAMUSCULAR | Status: AC | PRN
  Administered 2024-02-22 (×2): 1 mg via INTRAVENOUS
  Administered 2024-02-22: 2 mg via INTRAVENOUS

## 2024-02-22 MED ORDER — MIDAZOLAM HCL 2 MG/2ML IJ SOLN
INTRAMUSCULAR | Status: AC
  Filled 2024-02-22: qty 4

## 2024-02-22 MED ORDER — LIDOCAINE-EPINEPHRINE 1 %-1:100000 IJ SOLN
INTRAMUSCULAR | Status: AC
  Filled 2024-02-22: qty 1

## 2024-02-22 MED ORDER — DIPHENHYDRAMINE HCL 50 MG/ML IJ SOLN
INTRAMUSCULAR | Status: AC | PRN
  Administered 2024-02-22: 25 mg via INTRAVENOUS

## 2024-02-22 MED ORDER — DIPHENHYDRAMINE HCL 50 MG/ML IJ SOLN
INTRAMUSCULAR | Status: AC
  Filled 2024-02-22: qty 1

## 2024-02-22 MED ORDER — FENTANYL CITRATE (PF) 100 MCG/2ML IJ SOLN
INTRAMUSCULAR | Status: AC | PRN
  Administered 2024-02-22 (×2): 50 ug via INTRAVENOUS

## 2024-02-22 NOTE — Procedures (Signed)
Interventional Radiology Procedure Note  Procedure: rt internal jugular power port    Complications: None  Estimated Blood Loss:  min  Findings: Tip svcra    Sharen Counter, MD

## 2024-02-22 NOTE — Discharge Instructions (Signed)
For questions /concerns may call Interventional Radiology at [phone] or  Interventional Radiology clinic [phone]   You may remove your dressing and shower tomorrow afternoon  DO NOT use EMLA cream for 2 weeks after port placement as the cream will remove surgical glue on your incision.                                                                                         Implanted Port Insertion, Care After  The following information offers guidance on how to care for yourself after your procedure. Your health care provider may also give you more specific instructions. If you have problems or questions, contact your health care provider. What can I expect after the procedure? After the procedure, it is common to have: Discomfort at the port insertion site. Bruising on the skin over the port. This should improve over 3-4 days. Follow these instructions at home: [facility] care After your port is placed, you will get a manufacturer's information card. The card has information about your port. Keep this card with you at all times. Take care of the port as told by your health care provider. Ask your health care provider if you or a family member can get training for taking care of the port at home. A home health care nurse will be be available to help care for the port. Make sure to remember what type of port you have. Incision care  Follow instructions from your health care provider about how to take care of your port insertion site. Make sure you: Wash your hands with soap and water for at least 20 seconds before and after you change your bandage (dressing). If soap and water are not available, use hand sanitizer. Change your dressing as told by your health care provider. Leave stitches (sutures), skin glue, or adhesive strips in place. These skin closures may need to stay in place for 2 weeks or longer. If adhesive strip edges start to loosen and curl up, you may trim the loose edges. Do  not remove adhesive strips completely unless your health care provider tells you to do that. Check your port insertion site every day for signs of infection. Check for: Redness, swelling, or pain. Fluid or blood. Warmth. Pus or a bad smell. Activity Return to your normal activities as told by your health care provider. Ask your health care provider what activities are safe for you. You may have to avoid lifting. Ask your health care provider how much you can safely lift. General instructions Take over-the-counter and prescription medicines only as told by your health care provider. Do not take baths, swim, or use a hot tub until your health care provider approves. Ask your health care provider if you may take showers. You may only be allowed to take sponge baths. If you were given a sedative during the procedure, it can affect you for several hours. Do not drive or operate machinery until your health care provider says that it is safe. Wear a medical alert bracelet in case of an emergency. This will tell any health care providers that you have a port. Keep all follow-up visits.  This is important. Contact a health care provider if: You cannot flush your port with saline as directed, or you cannot draw blood from the port. You have a fever or chills. You have redness, swelling, or pain around your port insertion site. You have fluid or blood coming from your port insertion site. Your port insertion site feels warm to the touch. You have pus or a bad smell coming from the port insertion site. Get help right away if: You have chest pain or shortness of breath. You have bleeding from your port that you cannot control. These symptoms may be an emergency. Get help right away. Call 911. Do not wait to see if the symptoms will go away. Do not drive yourself to the hospital. Summary Take care of the port as told by your health care provider. Keep the manufacturer's information card with you at all  times. Change your dressing as told by your health care provider. Contact a health care provider if you have a fever or chills or if you have redness, swelling, or pain around your port insertion site. Keep all follow-up visits. This information is not intended to replace advice given to you by your health care provider. Make sure you discuss any questions you have with your health care provider.                                                                                              Moderate Conscious Sedation, Adult, Care After After the procedure, it is common to have: Sleepiness for a few hours. Impaired judgment for a few hours. Trouble with balance. Nausea or vomiting if you eat too soon. Follow these instructions at home: For the time period you were told by your health care provider:  Rest. Do not participate in activities where you could fall or become injured. Do not drive or use machinery. Do not drink alcohol. Do not take sleeping pills or medicines that cause drowsiness. Do not make important decisions or sign legal documents. Do not take care of children on your own. Eating and drinking Follow instructions from your health care provider about what you may eat and drink. Drink enough fluid to keep your urine pale yellow. If you vomit: Drink clear fluids slowly and in small amounts as you are able. Clear fluids include water, ice chips, low-calorie sports drinks, and fruit juice that has water added to it (diluted fruit juice). Eat light and bland foods in small amounts as you are able. These foods include bananas, applesauce, rice, lean meats, toast, and crackers. General instructions Take over-the-counter and prescription medicines only as told by your health care provider. Have a responsible adult stay with you for the time you are told. Do not use any products that contain nicotine or tobacco. These products include cigarettes, chewing tobacco, and vaping devices,  such as e-cigarettes. If you need help quitting, ask your health care provider. Return to your normal activities as told by your health care provider. Ask your health care provider what activities are safe for you. Your health care provider may give you more instructions. Make sure you  know what you can and cannot do. Contact a health care provider if: You are still sleepy or having trouble with balance after 24 hours. You feel light-headed. You vomit every time you eat or drink. You get a rash. You have a fever. You have redness or swelling around the IV site. Get help right away if: You have trouble breathing. You start to feel confused at home. These symptoms may be an emergency. Get help right away. Call 911. Do not wait to see if the symptoms will go away. Do not drive yourself to the hospital. This information is not intended to replace advice given to you by your health care provider. Make sure you discuss any questions you have with your health care provider. Document Revised: 12/01/2021 Document Reviewed: 12/01/2021 Elsevier Patient Education  2024 [employer].

## 2024-02-23 ENCOUNTER — Inpatient Hospital Stay

## 2024-02-23 ENCOUNTER — Other Ambulatory Visit: Payer: Self-pay | Admitting: *Deleted

## 2024-02-23 ENCOUNTER — Ambulatory Visit: Admitting: Radiation Oncology

## 2024-02-23 ENCOUNTER — Encounter: Payer: Self-pay | Admitting: Medical Oncology

## 2024-02-23 ENCOUNTER — Other Ambulatory Visit: Payer: Self-pay

## 2024-02-23 ENCOUNTER — Inpatient Hospital Stay (HOSPITAL_BASED_OUTPATIENT_CLINIC_OR_DEPARTMENT_OTHER): Admitting: Medical Oncology

## 2024-02-23 ENCOUNTER — Encounter: Payer: Self-pay | Admitting: *Deleted

## 2024-02-23 ENCOUNTER — Telehealth: Payer: Self-pay | Admitting: Pharmacy Technician

## 2024-02-23 ENCOUNTER — Inpatient Hospital Stay: Admitting: Licensed Clinical Social Worker

## 2024-02-23 ENCOUNTER — Encounter (HOSPITAL_COMMUNITY): Admission: RE | Admit: 2024-02-23 | Source: Ambulatory Visit

## 2024-02-23 VITALS — BP 111/70 | HR 72

## 2024-02-23 VITALS — BP 110/71 | HR 75 | Temp 98.6°F | Resp 17 | Wt 103.0 lb

## 2024-02-23 DIAGNOSIS — C19 Malignant neoplasm of rectosigmoid junction: Secondary | ICD-10-CM

## 2024-02-23 DIAGNOSIS — D649 Anemia, unspecified: Secondary | ICD-10-CM

## 2024-02-23 DIAGNOSIS — G893 Neoplasm related pain (acute) (chronic): Secondary | ICD-10-CM

## 2024-02-23 DIAGNOSIS — Z5111 Encounter for antineoplastic chemotherapy: Secondary | ICD-10-CM | POA: Diagnosis not present

## 2024-02-23 LAB — CMP (CANCER CENTER ONLY)
ALT: 8 U/L (ref 0–44)
AST: 15 U/L (ref 15–41)
Albumin: 3.9 g/dL (ref 3.5–5.0)
Alkaline Phosphatase: 75 U/L (ref 38–126)
Anion gap: 11 (ref 5–15)
BUN: 11 mg/dL (ref 6–20)
CO2: 20 mmol/L — ABNORMAL LOW (ref 22–32)
Calcium: 9 mg/dL (ref 8.9–10.3)
Chloride: 104 mmol/L (ref 98–111)
Creatinine: 0.52 mg/dL (ref 0.44–1.00)
GFR, Estimated: >60 mL/min (ref >60)
Glucose, Bld: 89 mg/dL (ref 70–99)
Potassium: 4.2 mmol/L (ref 3.5–5.1)
Sodium: 135 mmol/L (ref 135–145)
Total Bilirubin: 0.4 mg/dL (ref 0.0–1.2)
Total Protein: 7.3 g/dL (ref 6.5–8.1)

## 2024-02-23 LAB — CEA (ACCESS): CEA (CHCC): 15.49 ng/mL — ABNORMAL HIGH (ref 0.00–5.00)

## 2024-02-23 LAB — CBC WITH DIFFERENTIAL (CANCER CENTER ONLY)
Abs Immature Granulocytes: 0.02 K/uL (ref 0.00–0.07)
Basophils Absolute: 0 K/uL (ref 0.0–0.1)
Basophils Relative: 0 %
Eosinophils Absolute: 0.1 K/uL (ref 0.0–0.5)
Eosinophils Relative: 1 %
HCT: 32.6 % — ABNORMAL LOW (ref 36.0–46.0)
Hemoglobin: 10.8 g/dL — ABNORMAL LOW (ref 12.0–15.0)
Immature Granulocytes: 0 %
Lymphocytes Relative: 29 %
Lymphs Abs: 2 K/uL (ref 0.7–4.0)
MCH: 26.5 pg (ref 26.0–34.0)
MCHC: 33.1 g/dL (ref 30.0–36.0)
MCV: 80.1 fL (ref 80.0–100.0)
Monocytes Absolute: 0.6 K/uL (ref 0.1–1.0)
Monocytes Relative: 8 %
Neutro Abs: 4.3 K/uL (ref 1.7–7.7)
Neutrophils Relative %: 62 %
Platelet Count: 256 K/uL (ref 150–400)
RBC: 4.07 MIL/uL (ref 3.87–5.11)
RDW: 15.1 % (ref 11.5–15.5)
WBC Count: 7 K/uL (ref 4.0–10.5)
nRBC: 0 % (ref 0.0–0.2)

## 2024-02-23 LAB — PREGNANCY, URINE: Preg Test, Ur: NEGATIVE

## 2024-02-23 LAB — IRON AND IRON BINDING CAPACITY (CC-WL,HP ONLY)
Iron: 64 ug/dL (ref 28–170)
Saturation Ratios: 16 % (ref 10.4–31.8)
TIBC: 393 ug/dL (ref 250–450)
UIBC: 329 ug/dL

## 2024-02-23 LAB — FERRITIN: Ferritin: 19 ng/mL (ref 11–307)

## 2024-02-23 LAB — TOTAL PROTEIN, URINE DIPSTICK: Protein, ur: NEGATIVE mg/dL

## 2024-02-23 MED ORDER — ATROPINE SULFATE 1 MG/ML IV SOLN
0.5000 mg | Freq: Once | INTRAVENOUS | Status: AC | PRN
  Administered 2024-02-23: 0.5 mg via INTRAVENOUS
  Filled 2024-02-23: qty 1

## 2024-02-23 MED ORDER — PALONOSETRON HCL INJECTION 0.25 MG/5ML
0.2500 mg | Freq: Once | INTRAVENOUS | Status: AC
  Administered 2024-02-23: 0.25 mg via INTRAVENOUS
  Filled 2024-02-23: qty 5

## 2024-02-23 MED ORDER — SODIUM CHLORIDE 0.9 % IV SOLN
2000.0000 mg/m2 | INTRAVENOUS | Status: DC
  Administered 2024-02-23: 2900 mg via INTRAVENOUS
  Filled 2024-02-23: qty 58

## 2024-02-23 MED ORDER — SODIUM CHLORIDE 0.9 % IV SOLN
150.0000 mg | Freq: Once | INTRAVENOUS | Status: AC
  Administered 2024-02-23: 150 mg via INTRAVENOUS
  Filled 2024-02-23: qty 150

## 2024-02-23 MED ORDER — SODIUM CHLORIDE 0.9 % IV SOLN: INTRAVENOUS | Status: DC

## 2024-02-23 MED ORDER — DEXAMETHASONE SODIUM PHOSPHATE 10 MG/ML IJ SOLN
10.0000 mg | Freq: Once | INTRAMUSCULAR | Status: AC
  Administered 2024-02-23: 10 mg via INTRAVENOUS
  Filled 2024-02-23: qty 1

## 2024-02-23 MED ORDER — SODIUM CHLORIDE 0.9 % IV SOLN
400.0000 mg/m2 | Freq: Once | INTRAVENOUS | Status: AC
  Administered 2024-02-23: 584 mg via INTRAVENOUS
  Filled 2024-02-23: qty 29.2

## 2024-02-23 MED ORDER — SODIUM CHLORIDE 0.9 % IV SOLN: 5.0000 mg/kg | Freq: Once | INTRAVENOUS | Status: DC

## 2024-02-23 MED ORDER — FLUOROURACIL CHEMO INJECTION 2.5 GM/50ML
400.0000 mg/m2 | Freq: Once | INTRAVENOUS | Status: AC
  Administered 2024-02-23: 600 mg via INTRAVENOUS
  Filled 2024-02-23: qty 12

## 2024-02-23 MED ORDER — LIDOCAINE-PRILOCAINE 2.5-2.5 % EX CREA: 1.0000 | TOPICAL_CREAM | CUTANEOUS | 0 refills | Status: AC | PRN

## 2024-02-23 MED ORDER — SODIUM CHLORIDE 0.9 % IV SOLN
180.0000 mg/m2 | Freq: Once | INTRAVENOUS | Status: AC
  Administered 2024-02-23: 260 mg via INTRAVENOUS
  Filled 2024-02-23: qty 5

## 2024-02-23 MED ORDER — DEXTROSE 5 % IV SOLN: INTRAVENOUS | Status: DC

## 2024-02-23 MED ORDER — OXALIPLATIN CHEMO INJECTION 100 MG/20ML
85.0000 mg/m2 | Freq: Once | INTRAVENOUS | Status: AC
  Administered 2024-02-23: 125 mg via INTRAVENOUS
  Filled 2024-02-23: qty 20

## 2024-02-23 NOTE — Progress Notes (Signed)
 Patient plan has changed, from concurrent ChemoRT to Chemotherapy followed by concurrent ChemoRT.   Also, patient had her PET scan scheduled today, however she doesn't have insurance authorization so it was cancelled. Reached out to [place] team and they state the PET was denied and an appeal will need to be filed. Dr Timmy will address that within the next 48h.   Spoke to patient again about genetics. She does wish to proceed. Referral placed.   Patient is ready for treatment today. She had education this morning. Reviewed the after hours on-call coverage. She will pick up her meds on the way home today. She has no further questions or concerns today. She knows to reach out to the office as needed.   Oncology Nurse Navigator Documentation     02/23/2024   10:30 AM  Oncology Nurse Navigator Flowsheets  Planned Course of Treatment Chemotherapy  Phase of Treatment Chemotherapy  Chemotherapy Actual Start Date: 02/23/2024  Chemotherapy Expected End Date: 05/03/2024  Navigator Follow Up Date: 02/25/2024  Navigator Follow Up Reason: Radiology  Navigator Location [facility]  Treatment Initiated Date 02/23/2024  Patient Visit Type MedOnc  Treatment Phase First Chemo Tx  Barriers/Navigation Needs Coordination of Care;Education  Education Other  Interventions Education;Referrals  Acuity Level 2-Minimal Needs (1-2 Barriers Identified)  Referrals Genetics  Education Method Verbal  Support Groups/Services Friends and Family  Time Spent with Patient 30

## 2024-02-23 NOTE — Progress Notes (Signed)
 CEA

## 2024-02-23 NOTE — Patient Instructions (Signed)

## 2024-02-23 NOTE — Progress Notes (Signed)
 Principle Diagnosis: Metastatic Rectal Cancer  Chief Complaint  Patient presents with   Follow-up    Oncology History  Malignant neoplasm of rectosigmoid junction (HCC)  02/07/2024 Initial Diagnosis   Malignant neoplasm of rectosigmoid junction (HCC)   02/11/2024 Cancer Staging   Staging form: Colon and Rectum, AJCC 8th Edition - Clinical stage from 02/11/2024: cN2, cM0 - Signed by Timmy Maude SAUNDERS, MD on 02/11/2024 Stage prefix: Initial diagnosis Histologic grade (G): G2 Histologic grading system: 4 grade system   02/23/2024 -  Chemotherapy   Patient is on Treatment Plan : COLORECTAL FOLFIRINOX + Bevacizumab q14d        HPI: Amy Scott is a very nice 33 year old African-American female.  She has 4 children.  She has been fairly healthy.  Unfortunately, she presented to the ER at [facility] in early September.  She had some worsening abdominal pain.  She been having abdominal pain for about a month or so.  She had decreased appetite.  She noticed a change in her bowel habits.  She initially underwent CT scan think she had a kidney stone.  Unfortunate, this showed bowel wall thickening and inflammatory changes in the mid to high rectum.  It was felt that there was partial large bowel obstruction.  She had perirectal adenopathy.  I think she was admitted.  She underwent a flexible sigmoidoscopy on 02/03/2024.  There was a new obstructing rectal mass.  Biopsies were done.  The pathology report ([id number]) showed an invasive moderately differentiated adenocarcinoma.  It was MMR proficient and MSI normal.  Her labs at the time showed a sodium 142.  Potassium 3.4.  BUN 9 creatinine 0.67.  Calcium  9.1 with an albumin of 4.1.  Her CBC showed a white cell count of 9.1.  Hemoglobin 11.1.  Platelet count 326,000.  She has CT of the body on 02/04/2024.  This showed an ulcerated cervical rectal mass involving the distal rectosigmoid colon.  She had perirectal and presacral lymph nodes.   Liver looks fine.  There is nothing in the thoracic cavity.  She had a MRI of the rectum.  This was done 02/04/2024.  This unfortunately was halted because she cannot tolerate rectal gel insertion.  What was noted was transmural spread into the right mesorectum and sigmoid mesocolon.  She had pelvic adenopathy.  She has seen Dr. Maritza of Radiation Oncology.  She felt that radiation therapy would be needed to help prevent obstruction.  General surgery saw her.  They did not feel that she was a candidate for resection because of the extent of disease.  She is due to have radiation therapy to start next week or so.  Interval History:   Today she states that she is well. She has no concerns and feels ready to start chemotherapy.   She had her port-a-cath placed yesterday. This went well. Still a bit sore when she turns her head but no redness or fevers.   She had CT simulation on 02/16/2024  She is starting Day 1 Cycle 1 of FOLFOX chemotherapy today. She has had chemotherapy education.   She denies abdominal pain, vomiting, constipation, diarrhea, fevers, SOB, chest pain.   She looks quite good.  She does not smoke.  She has no history of alcohol use.  There is been no cough.  She has had no leg swelling.  She has had no rashes.  Overall, I would say her performance status is probably ECOG 1. Wt Readings from Last 3 Encounters:  02/23/24  103 lb (46.7 kg)  02/23/24 103 lb (46.7 kg)  02/22/24 101 lb (45.8 kg)   Past Medical History:  Diagnosis Date   Headache(784.0)    No pertinent past medical history   :   Past Surgical History:  Procedure Laterality Date   APPENDECTOMY     FLEXIBLE SIGMOIDOSCOPY N/A 02/03/2024   Procedure: KINGSTON SIDE;  Surgeon: Rollin Dover, MD;  Location: [facility];  Service: Gastroenterology;  Laterality: N/A;  rectal bleeding/abnormal CT scan.   IR IMAGING GUIDED PORT INSERTION  02/22/2024   MOUTH SURGERY    :   Current Outpatient  Medications:    Acetaminophen  Extra Strength 500 MG CAPS, Take 2 tablets by mouth 3 (three) times daily as needed. DO NOT EXCEED 6 PILLS OR 3000 MG IN A 24 HOUR PERIOD., Disp: 60 capsule, Rfl: 1   dexamethasone  (DECADRON ) 4 MG tablet, Take 2 tablets (8 mg total) by mouth daily. Take 2 tablets daily x 3 days starting the day after chemotherapy. Take with food., Disp: 30 tablet, Rfl: 1   feeding supplement (ENSURE PLUS HIGH PROTEIN) LIQD, Take 237 mLs by mouth 2 (two) times daily between meals., Disp: 5000 mL, Rfl: 0   ibuprofen  (ADVIL ) 800 MG tablet, Take 1 tablet (800 mg total) by mouth 3 (three) times daily as needed., Disp: 30 tablet, Rfl: 0   loperamide  (IMODIUM ) 2 MG capsule, Take 2 tabs by mouth with first loose stool, then 1 tab with each additional loose stool as needed. Do not exceed 8 tabs in a 24-hour period, Disp: 60 capsule, Rfl: 3   ondansetron  (ZOFRAN ) 8 MG tablet, Take 1 tablet (8 mg total) by mouth every 8 (eight) hours as needed for nausea or vomiting. Start on the third day after chemotherapy, Disp: 30 tablet, Rfl: 1   oxyCODONE  (OXY IR/ROXICODONE ) 5 MG immediate release tablet, Take 1-2 tablets (5-10 mg total) by mouth every 6 (six) hours as needed for moderate pain (pain score 4-6)., Disp: 120 tablet, Rfl: 0   polyethylene glycol powder (GLYCOLAX /MIRALAX ) 17 GM/SCOOP powder, Take 17 g dissolved in liquid by mouth 2 (two) times daily., Disp: 238 g, Rfl: 0   prochlorperazine  (COMPAZINE ) 10 MG tablet, Take 1 tablet (10 mg total) by mouth every 6 (six) hours as needed for nausea or vomiting (Nausea or vomiting)., Disp: 30 tablet, Rfl: 1 No current facility-administered medications for this visit.  Facility-Administered Medications Ordered in Other Visits:    0.9 %  sodium chloride  infusion, , Intravenous, Continuous, Ennever, Maude SAUNDERS, MD, Last Rate: 100 mL/hr at 02/23/24 1136, Infusion Verify at 02/23/24 1136   atropine  injection 0.5 mg, 0.5 mg, Intravenous, Once PRN, Ennever, Peter R,  MD   dextrose  5 % solution, , Intravenous, Continuous, Ennever, Maude SAUNDERS, MD, Last Rate: 10 mL/hr at 02/23/24 1159, New Bag at 02/23/24 1159   fluorouracil  (ADRUCIL ) 2,900 mg in sodium chloride  0.9 % 92 mL chemo infusion, 2,000 mg/m2 (Treatment Plan Recorded), Intravenous, 1 day or 1 dose, Ennever, Maude SAUNDERS, MD   fluorouracil  (ADRUCIL ) chemo injection 600 mg, 400 mg/m2 (Treatment Plan Recorded), Intravenous, Once, Ennever, Maude SAUNDERS, MD   irinotecan  (CAMPTOSAR ) 260 mg in sodium chloride  0.9 % 500 mL chemo infusion, 180 mg/m2 (Treatment Plan Recorded), Intravenous, Once, Ennever, Maude SAUNDERS, MD   leucovorin  584 mg in sodium chloride  0.9 % 250 mL infusion, 400 mg/m2 (Treatment Plan Recorded), Intravenous, Once, Ennever, Maude SAUNDERS, MD   oxaliplatin  (ELOXATIN ) 125 mg in dextrose  5 % 500 mL chemo infusion, 85 mg/m2 (  Treatment Plan Recorded), Intravenous, Once, Timmy Maude SAUNDERS, MD, Last Rate: 263 mL/hr at 02/23/24 1201, 125 mg at 02/23/24 1201:  Allergies  Allergen Reactions   Morphine And Codeine Hives, Shortness Of Breath, Itching and Swelling  :  No family history on file.:   Social History   Socioeconomic History   Marital status: Single    Spouse name: Not on file   Number of children: Not on file   Years of education: Not on file   Highest education level: Not on file  Occupational History   Not on file  Tobacco Use   Smoking status: Former    Types: Cigarettes   Smokeless tobacco: Never  Vaping Use   Vaping status: Never Used  Substance and Sexual Activity   Alcohol use: No   Drug use: No   Sexual activity: Yes  Other Topics Concern   Not on file  Social History Narrative   Not on file   Social Drivers of Health   Financial Resource Strain: Not on file  Food Insecurity: No Food Insecurity (02/11/2024)   Hunger Vital Sign    Worried About Running Out of Food in the Last Year: Never true    Ran Out of Food in the Last Year: Never true  Transportation Needs: No Transportation  Needs (02/11/2024)   PRAPARE - Administrator, Civil Service (Medical): No    Lack of Transportation (Non-Medical): No  Physical Activity: Not on file  Stress: Not on file  Social Connections: Not on file  Intimate Partner Violence: Not At Risk (02/11/2024)   Humiliation, Afraid, Rape, and Kick questionnaire    Fear of Current or Ex-Partner: No    Emotionally Abused: No    Physically Abused: No    Sexually Abused: No    Review of Systems  Constitutional:  Positive for weight loss.  HENT: Negative.    Eyes: Negative.   Respiratory: Negative.    Cardiovascular: Negative.   Gastrointestinal: Negative.   Genitourinary: Negative.   Musculoskeletal: Negative.   Skin: Negative.   Neurological: Negative.   Endo/Heme/Allergies: Negative.   Psychiatric/Behavioral: Negative.       Vitals:   02/23/24 0942  BP: 110/71  Pulse: 75  Resp: 17  Temp: 98.6 F (37 C)  SpO2: 100%    Physical Exam Vitals and nursing note reviewed.  Constitutional:      General: She is not in acute distress.    Appearance: Normal appearance. She is not ill-appearing, toxic-appearing or diaphoretic.  HENT:     Head: Normocephalic and atraumatic.  Eyes:     Pupils: Pupils are equal, round, and reactive to light.  Cardiovascular:     Rate and Rhythm: Normal rate and regular rhythm.     Heart sounds: Normal heart sounds.  Pulmonary:     Effort: Pulmonary effort is normal.     Breath sounds: Normal breath sounds.  Abdominal:     General: Bowel sounds are normal.     Palpations: Abdomen is soft.  Musculoskeletal:        General: No tenderness or deformity. Normal range of motion.     Cervical back: Normal range of motion.  Lymphadenopathy:     Cervical: No cervical adenopathy.  Skin:    General: Skin is warm and dry.     Findings: No erythema or rash.  Neurological:     Mental Status: She is alert and oriented to person, place, and time.  Psychiatric:  Behavior: Behavior  normal.        Thought Content: Thought content normal.        Judgment: Judgment normal.     Recent Labs    02/23/24 0950  WBC 7.0  HGB 10.8*  HCT 32.6*  PLT 256    Recent Labs    02/23/24 0950  NA 135  K 4.2  CL 104  CO2 20*  GLUCOSE 89  BUN 11  CREATININE 0.52  CALCIUM  9.0     Encounter Diagnoses  Name Primary?   Malignant neoplasm of rectosigmoid junction (HCC) Yes   Rectosigmoid cancer (HCC)    Cancer related pain     Assessment and Plan: Ms. Moffat is a very nice 33 year old African- American female.  She has probably at least stage III rectal cancer.  She had her port-a-cath placed yesterday. PET scan pending to further assess for metastatic disease. Staging MRI on 02/14/2024 showed evidence of metastatic disease.  Goal is curative resection. She is now on chemotherapy with radiotherapy with FOLFOX.   Reviewed common potential side effects and precautions. Iron  levels pending- will supplement if needed. Discussed IV iron  including common side effects, risk of allergic reaction and benefits of use.   CBC and CMP acceptable for TX today  RTC 2 days for pump d/c RTC 2 weeks MD, port labs, FOLFOX Day 1 Cycle 2  Lauraine CHRISTELLA Dais PA-C

## 2024-02-23 NOTE — Patient Instructions (Signed)
 [facility] - A DEPT OF [facility]  Discharge Instructions: Thank you for choosing [facility] to provide your oncology and hematology care.   If you have a lab appointment with the Cancer Center, please go directly to the Cancer Center and check in at the registration area.  Wear comfortable clothing and clothing appropriate for easy access to any Portacath or PICC line.   We strive to give you quality time with your provider. You may need to reschedule your appointment if you arrive late (15 or more minutes).  Arriving late affects you and other patients whose appointments are after yours.  Also, if you miss three or more appointments without notifying the office, you may be dismissed from the clinic at the provider's discretion.      For prescription refill requests, have your pharmacy contact our office and allow 72 hours for refills to be completed.    Today you received the following chemotherapy and/or immunotherapy agents 5FU, Leucovorin , Irinotecan , Oxaliplatin       To help prevent nausea and vomiting after your treatment, we encourage you to take your nausea medication as directed.  BELOW ARE SYMPTOMS THAT SHOULD BE REPORTED IMMEDIATELY: *FEVER GREATER THAN 100.4 F (38 C) OR HIGHER *CHILLS OR SWEATING *NAUSEA AND VOMITING THAT IS NOT CONTROLLED WITH YOUR NAUSEA MEDICATION *UNUSUAL SHORTNESS OF BREATH *UNUSUAL BRUISING OR BLEEDING *URINARY PROBLEMS (pain or burning when urinating, or frequent urination) *BOWEL PROBLEMS (unusual diarrhea, constipation, pain near the anus) TENDERNESS IN MOUTH AND THROAT WITH OR WITHOUT PRESENCE OF ULCERS (sore throat, sores in mouth, or a toothache) UNUSUAL RASH, SWELLING OR PAIN  UNUSUAL VAGINAL DISCHARGE OR ITCHING   Items with * indicate a potential emergency and should be followed up as soon as possible or go to the Emergency Department if any problems should occur.  Please show the CHEMOTHERAPY  ALERT CARD or IMMUNOTHERAPY ALERT CARD at check-in to the Emergency Department and triage nurse. Should you have questions after your visit or need to cancel or reschedule your appointment, please contact [facility] - A DEPT OF [facility]  [phone] and follow the prompts.  Office hours are 8:00 a.m. to 4:30 p.m. Monday - Friday. Please note that voicemails left after 4:00 p.m. may not be returned until the following business day.  We are closed weekends and major holidays. You have access to a nurse at all times for urgent questions. Please call the main number to the clinic [phone] and follow the prompts.  For any non-urgent questions, you may also contact your provider using MyChart. We now offer e-Visits for anyone 3 and older to request care online for non-urgent symptoms. For details visit [email or web address].   Also download the MyChart app! Go to the app store, search MyChart, open the app, select [place], and log in with your MyChart username and password.

## 2024-02-23 NOTE — Telephone Encounter (Signed)
 Patient approved for the [employer].  [name] Patient Pharmacologist [facility]

## 2024-02-23 NOTE — Progress Notes (Signed)
 Patient here in infusion to start chemotherapy.  Saw Lauraine Dais PA prior to appt.  Held chemotherapy education in infusion room.  Significant other present.  Discussed side effects of Oxaliplatin , Leucovorin , Irinotecan , 5FU.  Held Avastin today due to port insertion yesterday  which include but are not limited to myelosuppression, decreased appetite, fatigue, fever, allergic or infusional reaction, mucositis, cardiac toxicity, cough, SOB, altered taste, nausea and vomiting, diarrhea, constipation, elevated LFTs myalgia and arthralgias, hair loss or thinning, rash, skin dryness, nail changes, peripheral neuropathy, discolored urine, delayed wound healing, mental changes (Chemo brain), increased risk of infections, weight loss.  Reviewed infusion room and office policy and procedure and phone numbers 24 hours x 7 days a week.  Reviewed ambulatory pump specifics and how to manage safe handling at home.  Reviewed when to call the office with any concerns or problems.  Transport planner given.  Discussed portacath insertion and EMLA  cream administration.  Antiemetic protocol and chemotherapy schedule reviewed. Patient verbalized understanding of chemotherapy indications and possible side effects.  Teachback done

## 2024-02-23 NOTE — Progress Notes (Signed)
 [facility] CSW Progress Note  Visual merchandiser met with patient to follow-up on need for community resources.    Interventions: Provided patient with information about the [facility] and social security disability.  She stated her phone was currently not working.  Provided her with Lori Sambol contact information at the [facility] and encouraged patient to contact her.  Patient's boyfriend, Brant, was also present.  Patient expressed no other needs.       Follow Up Plan:  CSW will follow-up with patient by phone     [name], LCSW Clinical Social Worker [facility]    Patient is participating in a Managed Medicaid Plan:  Yes

## 2024-02-23 NOTE — Progress Notes (Signed)
 Holding bevacizumab for cycle 1 since port placement was on 02/22/24. Planning to start with cycle 2 per Dr. Timmy.  Norleen JAYSON Sou, [facility] 02/23/24 12:25 PM

## 2024-02-24 ENCOUNTER — Telehealth: Payer: Self-pay | Admitting: Dietician

## 2024-02-24 ENCOUNTER — Encounter (HOSPITAL_COMMUNITY): Payer: Self-pay

## 2024-02-24 ENCOUNTER — Encounter: Payer: Self-pay | Admitting: *Deleted

## 2024-02-24 ENCOUNTER — Ambulatory Visit

## 2024-02-24 ENCOUNTER — Encounter: Admitting: Dietician

## 2024-02-24 NOTE — Telephone Encounter (Signed)
 Attempted to reach patient for a scheduled remote nutrition consult. Her preferred number is not taking calls.  Provided my contact information in text to return call for her  nutrition consult.  Micheline Craven, RDN, LDN Registered Dietitian, [facility] Part Time Remote (Usual office hours: Tuesday-Thursday) Cell: [phone]

## 2024-02-25 ENCOUNTER — Inpatient Hospital Stay

## 2024-02-25 ENCOUNTER — Ambulatory Visit

## 2024-02-25 ENCOUNTER — Encounter: Payer: Self-pay | Admitting: Hematology & Oncology

## 2024-02-25 DIAGNOSIS — Z5111 Encounter for antineoplastic chemotherapy: Secondary | ICD-10-CM | POA: Diagnosis not present

## 2024-02-25 NOTE — Progress Notes (Signed)
 Insurance has denied PET even after peer to peer. Spoke to patient today while she is here for pump dc. She is aware that at this time we will hold on PET and move forward with the scans she had.   Patient has had some nausea over the last 48h. She has tried both the compazine  and zofran  without much success. Patient has been drinking gingerale and she feels like this is helpful. She doesn't feel dehydrated. She is encouraged to keep on top of symptoms over the weekend and to continue her antiemetics. If this symptoms doesn't improve she is going to call the office on Monday for further symptom management.   Oncology Nurse Navigator Documentation     02/25/2024    1:30 PM  Oncology Nurse Navigator Flowsheets  Navigator Follow Up Date: 03/08/2024  Navigator Follow Up Reason: Follow-up Appointment;Chemotherapy  Navigator Radiographer, therapeutic Encounter Type Treatment  Patient Visit Type MedOnc  Treatment Phase Active Tx  Barriers/Navigation Needs Coordination of Care;Education  Education Pain/ Symptom Management  Interventions Education;Psycho-Social Support  Acuity Level 2-Minimal Needs (1-2 Barriers Identified)  Education Method Verbal  Support Groups/Services Friends and Family  Time Spent with Patient 15

## 2024-02-25 NOTE — Patient Instructions (Signed)

## 2024-02-27 ENCOUNTER — Emergency Department (HOSPITAL_BASED_OUTPATIENT_CLINIC_OR_DEPARTMENT_OTHER)
Admission: EM | Admit: 2024-02-27 | Discharge: 2024-02-27 | Disposition: A | Discharge status: Home / Self Care | Attending: Emergency Medicine | Admitting: Emergency Medicine

## 2024-02-27 ENCOUNTER — Emergency Department (HOSPITAL_BASED_OUTPATIENT_CLINIC_OR_DEPARTMENT_OTHER)

## 2024-02-27 ENCOUNTER — Other Ambulatory Visit: Payer: Self-pay

## 2024-02-27 ENCOUNTER — Encounter (HOSPITAL_BASED_OUTPATIENT_CLINIC_OR_DEPARTMENT_OTHER): Payer: Self-pay

## 2024-02-27 DIAGNOSIS — E876 Hypokalemia: Secondary | ICD-10-CM | POA: Diagnosis not present

## 2024-02-27 DIAGNOSIS — K6289 Other specified diseases of anus and rectum: Secondary | ICD-10-CM | POA: Diagnosis not present

## 2024-02-27 DIAGNOSIS — T451X5A Adverse effect of antineoplastic and immunosuppressive drugs, initial encounter: Secondary | ICD-10-CM | POA: Insufficient documentation

## 2024-02-27 DIAGNOSIS — R112 Nausea with vomiting, unspecified: Secondary | ICD-10-CM | POA: Insufficient documentation

## 2024-02-27 HISTORY — DX: Malignant (primary) neoplasm, unspecified: C80.1

## 2024-02-27 LAB — URINALYSIS, ROUTINE W REFLEX MICROSCOPIC
Bilirubin Urine: NEGATIVE
Glucose, UA: NEGATIVE mg/dL
Hgb urine dipstick: NEGATIVE
Ketones, ur: NEGATIVE mg/dL
Leukocytes,Ua: NEGATIVE
Nitrite: NEGATIVE
Protein, ur: NEGATIVE mg/dL
Specific Gravity, Urine: 1.01 (ref 1.005–1.030)
pH: 6.5 (ref 5.0–8.0)

## 2024-02-27 LAB — COMPREHENSIVE METABOLIC PANEL WITH GFR
ALT: 12 U/L (ref 0–44)
AST: 22 U/L (ref 15–41)
Albumin: 4.6 g/dL (ref 3.5–5.0)
Alkaline Phosphatase: 84 U/L (ref 38–126)
Anion gap: 20 — ABNORMAL HIGH (ref 5–15)
BUN: 11 mg/dL (ref 6–20)
CO2: 18 mmol/L — ABNORMAL LOW (ref 22–32)
Calcium: 9.7 mg/dL (ref 8.9–10.3)
Chloride: 99 mmol/L (ref 98–111)
Creatinine, Ser: 0.87 mg/dL (ref 0.44–1.00)
GFR, Estimated: >60 mL/min (ref >60)
Glucose, Bld: 91 mg/dL (ref 70–99)
Potassium: 2.8 mmol/L — ABNORMAL LOW (ref 3.5–5.1)
Sodium: 136 mmol/L (ref 135–145)
Total Bilirubin: 0.8 mg/dL (ref 0.0–1.2)
Total Protein: 8.7 g/dL — ABNORMAL HIGH (ref 6.5–8.1)

## 2024-02-27 LAB — MAGNESIUM: Magnesium: 2.1 mg/dL (ref 1.7–2.4)

## 2024-02-27 LAB — BASIC METABOLIC PANEL WITH GFR
Anion gap: 12 (ref 5–15)
BUN: 11 mg/dL (ref 6–20)
CO2: 17 mmol/L — ABNORMAL LOW (ref 22–32)
Calcium: 8.2 mg/dL — ABNORMAL LOW (ref 8.9–10.3)
Chloride: 107 mmol/L (ref 98–111)
Creatinine, Ser: 0.62 mg/dL (ref 0.44–1.00)
GFR, Estimated: >60 mL/min (ref >60)
Glucose, Bld: 127 mg/dL — ABNORMAL HIGH (ref 70–99)
Potassium: 3.3 mmol/L — ABNORMAL LOW (ref 3.5–5.1)
Sodium: 136 mmol/L (ref 135–145)

## 2024-02-27 LAB — CBC WITH DIFFERENTIAL/PLATELET
Abs Immature Granulocytes: 0.02 K/uL (ref 0.00–0.07)
Basophils Absolute: 0 K/uL (ref 0.0–0.1)
Basophils Relative: 0 %
Eosinophils Absolute: 0 K/uL (ref 0.0–0.5)
Eosinophils Relative: 0 %
HCT: 37.8 % (ref 36.0–46.0)
Hemoglobin: 12.9 g/dL (ref 12.0–15.0)
Immature Granulocytes: 0 %
Lymphocytes Relative: 54 %
Lymphs Abs: 3.3 K/uL (ref 0.7–4.0)
MCH: 26.9 pg (ref 26.0–34.0)
MCHC: 34.1 g/dL (ref 30.0–36.0)
MCV: 78.8 fL — ABNORMAL LOW (ref 80.0–100.0)
Monocytes Absolute: 0.1 K/uL (ref 0.1–1.0)
Monocytes Relative: 1 %
Neutro Abs: 2.8 K/uL (ref 1.7–7.7)
Neutrophils Relative %: 45 %
Platelets: 287 K/uL (ref 150–400)
RBC: 4.8 MIL/uL (ref 3.87–5.11)
RDW: 14.6 % (ref 11.5–15.5)
WBC: 6.2 K/uL (ref 4.0–10.5)
nRBC: 0 % (ref 0.0–0.2)

## 2024-02-27 LAB — HCG, SERUM, QUALITATIVE: Preg, Serum: NEGATIVE

## 2024-02-27 LAB — LIPASE, BLOOD: Lipase: 86 U/L — ABNORMAL HIGH (ref 11–51)

## 2024-02-27 MED ORDER — PROCHLORPERAZINE EDISYLATE 10 MG/2ML IJ SOLN
10.0000 mg | Freq: Once | INTRAMUSCULAR | Status: AC
  Administered 2024-02-27: 10 mg via INTRAVENOUS
  Filled 2024-02-27: qty 2

## 2024-02-27 MED ORDER — DEXAMETHASONE SODIUM PHOSPHATE 10 MG/ML IJ SOLN
10.0000 mg | Freq: Once | INTRAMUSCULAR | Status: AC
  Administered 2024-02-27: 10 mg via INTRAVENOUS
  Filled 2024-02-27: qty 1

## 2024-02-27 MED ORDER — SODIUM CHLORIDE 0.9 % IV BOLUS
1000.0000 mL | Freq: Once | INTRAVENOUS | Status: AC
  Administered 2024-02-27: 1000 mL via INTRAVENOUS

## 2024-02-27 MED ORDER — DIPHENHYDRAMINE HCL 50 MG/ML IJ SOLN
25.0000 mg | Freq: Once | INTRAMUSCULAR | Status: AC
  Administered 2024-02-27: 25 mg via INTRAVENOUS
  Filled 2024-02-27: qty 1

## 2024-02-27 MED ORDER — KETOROLAC TROMETHAMINE 15 MG/ML IJ SOLN
15.0000 mg | Freq: Once | INTRAMUSCULAR | Status: AC
  Administered 2024-02-27: 15 mg via INTRAVENOUS
  Filled 2024-02-27: qty 1

## 2024-02-27 MED ORDER — POTASSIUM CHLORIDE 10 MEQ/100ML IV SOLN
10.0000 meq | Freq: Once | INTRAVENOUS | Status: AC
  Administered 2024-02-27: 10 meq via INTRAVENOUS
  Filled 2024-02-27: qty 100

## 2024-02-27 MED ORDER — POTASSIUM CHLORIDE ER 10 MEQ PO TBCR: 10.0000 meq | EXTENDED_RELEASE_TABLET | Freq: Every day | ORAL | 0 refills | Status: DC

## 2024-02-27 MED ORDER — POTASSIUM CHLORIDE CRYS ER 20 MEQ PO TBCR
40.0000 meq | EXTENDED_RELEASE_TABLET | Freq: Once | ORAL | Status: AC
  Administered 2024-02-27: 40 meq via ORAL
  Filled 2024-02-27: qty 2

## 2024-02-27 NOTE — ED Provider Notes (Signed)
 [place] [facility] Provider Note   CSN: [id number] Arrival date & time: 02/27/24  1438     Patient presents with: Emesis   Amy Scott is a 33 y.o. 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  She reports she has felt very nauseous and has not been able to eat anything after this session.  She has tried home Zofran  and Phenergan  without improvement in symptoms.  She reports that she felt short of breath when she went to throw up.  Denies any shortness of breath at rest or chest pain.  She is also reporting some ongoing lower abdominal and rectal pain.    Emesis      Prior to Admission medications   Medication Sig Start Date End Date Taking? Authorizing Provider  ondansetron  (ZOFRAN ) 8 MG tablet Take 1 tablet (8 mg total) by mouth every 8 (eight) hours as needed for nausea or vomiting. Start on the third day after chemotherapy 02/22/24  Yes Ennever, Maude SAUNDERS, MD  oxyCODONE  (OXY IR/ROXICODONE ) 5 MG immediate release tablet Take 1-2 tablets (5-10 mg total) by mouth every 6 (six) hours as needed for moderate pain (pain score 4-6). 02/16/24  Yes Lanell Donald Stagger, PA-C  potassium chloride  (KLOR-CON ) 10 MEQ tablet Take 1 tablet (10 mEq total) by mouth daily for 5 days. 02/28/24 03/04/24 Yes Veta Palma, PA-C  prochlorperazine  (COMPAZINE ) 10 MG tablet Take 1 tablet (10 mg total) by mouth every 6 (six) hours as needed for nausea or vomiting (Nausea or vomiting). 02/22/24  Yes Timmy Maude SAUNDERS, MD  Acetaminophen  Extra Strength 500 MG CAPS Take 2 tablets by mouth 3 (three) times daily as needed. DO NOT EXCEED 6 PILLS OR 3000 MG IN A 24 HOUR PERIOD. 02/16/24   Maritza Stagger, MD  dexamethasone  (DECADRON ) 4 MG tablet Take 2 tablets (8 mg total) by mouth daily. Take 2 tablets daily x 3 days starting the day  after chemotherapy. Take with food. 02/22/24   Timmy Maude SAUNDERS, MD  feeding supplement (ENSURE PLUS HIGH PROTEIN) LIQD Take 237 mLs by mouth 2 (two) times daily between meals. 02/07/24   Jillian Buttery, MD  ibuprofen  (ADVIL ) 800 MG tablet Take 1 tablet (800 mg total) by mouth 3 (three) times daily as needed. 02/16/24   Maritza Stagger, MD  lidocaine -prilocaine  (EMLA ) cream Apply 1 Application topically as needed. 02/23/24   Timmy Maude SAUNDERS, MD  loperamide  (IMODIUM ) 2 MG capsule Take 2 tabs by mouth with first loose stool, then 1 tab with each additional loose stool as needed. Do not exceed 8 tabs in a 24-hour period 02/22/24   Ennever, Peter R, MD  polyethylene glycol powder (GLYCOLAX /MIRALAX ) 17 GM/SCOOP powder Take 17 g dissolved in liquid by mouth 2 (two) times daily. 02/16/24   Maritza Stagger, MD    Allergies: Morphine and codeine    Review of Systems  Gastrointestinal:  Positive for vomiting.    Updated Vital Signs BP 129/80   Pulse 73   Temp 99 F (37.2 C) (Oral)   Resp 19   LMP 01/27/2024 (Exact Date)   SpO2 100%   Physical Exam Vitals and nursing note reviewed.  Constitutional:      General: She is not in acute distress.    Appearance: She is well-developed.     Comments: No active vomiting  HENT:     Head: Normocephalic and  atraumatic.  Eyes:     Conjunctiva/sclera: Conjunctivae normal.  Cardiovascular:     Rate and Rhythm: Normal rate and regular rhythm.     Heart sounds: No murmur heard. Pulmonary:     Effort: Pulmonary effort is normal. No respiratory distress.     Breath sounds: Normal breath sounds.  Abdominal:     Palpations: Abdomen is soft.     Tenderness: There is no abdominal tenderness.  Musculoskeletal:        General: No swelling.     Cervical back: Neck supple.  Skin:    General: Skin is warm and dry.     Capillary Refill: Capillary refill takes less than 2 seconds.  Neurological:     Mental Status: She is alert.  Psychiatric:        Mood and  Affect: Mood normal.     (all labs ordered are listed, but only abnormal results are displayed) Labs Reviewed  CBC WITH DIFFERENTIAL/PLATELET - Abnormal; Notable for the following components:      Result Value   MCV 78.8 (*)    All other components within normal limits  COMPREHENSIVE METABOLIC PANEL WITH GFR - Abnormal; Notable for the following components:   Potassium 2.8 (*)    CO2 18 (*)    Total Protein 8.7 (*)    Anion gap 20 (*)    All other components within normal limits  LIPASE, BLOOD - Abnormal; Notable for the following components:   Lipase 86 (*)    All other components within normal limits  URINALYSIS, ROUTINE W REFLEX MICROSCOPIC - Abnormal; Notable for the following components:   Color, Urine STRAW (*)    All other components within normal limits  BASIC METABOLIC PANEL WITH GFR - Abnormal; Notable for the following components:   Potassium 3.3 (*)    CO2 17 (*)    Glucose, Bld 127 (*)    Calcium  8.2 (*)    All other components within normal limits  HCG, SERUM, QUALITATIVE  MAGNESIUM    EKG: None  Radiology: DG Chest 2 View Result Date: 02/27/2024 CLINICAL DATA:  Shortness of breath. EXAM: CHEST - 2 VIEW COMPARISON:  January 31, 2021 FINDINGS: A right-sided venous Port-A-Cath is seen with its distal tip active junction of the superior vena cava and right atrium. The heart size and mediastinal contours are within normal limits. Both lungs are clear. The visualized skeletal structures are unremarkable. IMPRESSION: No active cardiopulmonary disease. Electronically Signed   By: Suzen Dials M.D.   On: 02/27/2024 16:40     Procedures   Medications Ordered in the ED  sodium chloride  0.9 % bolus 1,000 mL (0 mLs Intravenous Stopped 02/27/24 1710)  dexamethasone  (DECADRON ) injection 10 mg (10 mg Intravenous Given 02/27/24 1551)  prochlorperazine  (COMPAZINE ) injection 10 mg (10 mg Intravenous Given 02/27/24 1552)  diphenhydrAMINE  (BENADRYL ) injection 25 mg (25 mg  Intravenous Given 02/27/24 1551)  ketorolac  (TORADOL ) 15 MG/ML injection 15 mg (15 mg Intravenous Given 02/27/24 1553)  potassium chloride  10 mEq in 100 mL IVPB (0 mEq Intravenous Stopped 02/27/24 1847)  potassium chloride  SA (KLOR-CON  M) CR tablet 40 mEq (40 mEq Oral Given 02/27/24 1723)    Clinical Course as of 02/27/24 2054  Sun Feb 27, 2024  1741 Patient reports she is feeling better with the medications given.  Will attempt p.o. challenge.  She is still receiving KCl [AF]  2039 P.o. challenged with ginger ale, tolerated this well.  She is feeling much better.  Potassium proved  to 3.3 upon repeat. [AF]    Clinical Course User Index [AF] Veta Palma, PA-C                                 Medical Decision Making Amount and/or Complexity of Data Reviewed Labs: ordered. Radiology: ordered.  Risk Prescription drug management.     Differential diagnosis includes but is not limited to Post-chemotherapy nausea and vomiting, cannabinoid hyperemesis syndrome, acute cholecystitis, cholelithiasis, cholangitis, choledocholithiasis, peptic ulcer, gastritis, gastroenteritis, appendicitis, IBS, IBD, DKA, nephrolithiasis, UTI, pyelonephritis, pancreatitis, diverticulitis, mesenteric ischemia, abdominal aortic aneurysm, small bowel obstruction, volvulus,ovarian torsion and pregnancy related concerns in females of childbearing age    ED Course:  Upon initial evaluation, patient is well-appearing, no acute distress.  Stable vitals.  Reporting ongoing nausea vomiting since receiving her first dose of chemotherapy on 9/24.  No active vomiting.  Abdomen is soft and nontender.  She reported some shortness of breath when she had an episode of vomiting earlier today, but not currently feeling short of breath.  No chest pain.  Lungs good auscultation bilaterally. EKG QTc checked before giving antiemetics.  QTc within normal range.  Will proceed with Compazine , Decadron , and Benadryl  for nausea.  Will give  NS bolus  Labs Ordered: I Ordered, and personally interpreted labs.  The pertinent results include:   CBC unremarkable, no leukocytosis CMP with hypokalemia at 2.8, anion gap 20.  LFTs and creatinine within normal limits. Magnesium within normal limits Lipase elevated at 86 Pregnancy negative  Imaging Studies ordered: I ordered imaging studies including chest x-ray I independently visualized the imaging with scope of interpretation limited to determining acute life threatening conditions related to emergency care. Imaging showed no acute abnormalities I agree with the radiologist interpretation   Cardiac Monitoring: / EKG: The patient was maintained on a cardiac monitor.  I personally viewed and interpreted the cardiac monitored which showed an underlying rhythm of: Normal sinus rhythm, normal QTc at 401   Medications Given: NS bolus 40 mEq oral Kcl 10 mEq IV Kcl Toradol  Compazine  Decadron  Benadryl   Upon re-evaluation, patient reports she is feeling much better.  Has not had any further nausea here in the emergency room after receiving medications.  She was p.o. challenged with ginger ale and tolerated this well.  I suspect that her nausea and vomiting is secondary to her chemotherapy as his symptoms started immediately after her chemotherapy.  She does not have any significant abdominal tenderness to palpation on exam, normal LFTs, creatinine, no leukocytosis, low concern for acute intra-abdominal abnormality.  She is having normal bowel movements, tolerating p.o. intake here in the ER, low concern for SBO.  She has a mildly elevated lipase at 86, but this is not 3 times upper limit of normal, no epigastric abdominal tenderness to palpation, I have low concern for pancreatitis.  No fever or tachycardia, no leukocytosis, no concern for systemic infection.  Urinalysis without signs of infection.  Her potassium was initially low at 2.8 upon arrival, normal magnesium.  She was given  IV and oral potassium.  Levels were rechecked and potassium improved to 3.3.  Since she is tolerating p.o. intake and patient feeling better, feel she is stable and appropriate for discharge home   Impression: Chemotherapy-induced nausea and vomiting  Disposition:  The patient was discharged home with instructions to continue with prescribed dexamethasone , Compazine , and Zofran  she has prescribed from her oncologist for nausea and vomiting.  Will prescribe potassium supplements to take at home.  Follow-up with her PCP within the next 2 weeks for repeat lipase and repeat potassium level.  Dr. Her oncologist for further recommendations regarding her nausea vomiting symptoms. Return precautions given.    Record Review: External records from outside source obtained and reviewed including oncology notes     This chart was dictated using voice recognition software, Dragon. Despite the best efforts of this provider to proofread and correct errors, errors may still occur which can change documentation meaning.       Final diagnoses:  Chemotherapy induced nausea and vomiting  Hypokalemia    ED Discharge Orders          Ordered    potassium chloride  (KLOR-CON ) 10 MEQ tablet  Daily        02/27/24 2045               Veta Palma, PA-C 02/27/24 2054    Ruthe Cornet, DO 02/27/24 2135

## 2024-02-27 NOTE — Discharge Instructions (Addendum)
 You were seen here today for your nausea, vomiting, and abdominal pain.  The symptoms seem to be secondary to your recent chemotherapy.  Please continue taking the prescribed dexamethasone , Compazine , and Zofran  you have for your symptoms.  Your labs here were reassuring.  Your kidney and liver labs were normal.  Your blood counts are normal.  Your urine did not show any signs of infection.  Your lipase which is your pancreas enzyme is slightly elevated today.  Please have this monitored by your PCP  You were found to have a slightly low potassium level on your labs today.  You have been prescribed a potassium supplement to take at home for the next 5 days.  Please increase your dietary intake of potassium with foods such as avocados, potatoes, bananas, spinach, salmon. Please have your PCP monitor this value.   You continue to have issues with nausea and vomiting after your chemotherapy, please talk to your oncologist for further strategies in managing your symptoms.  Please return to the ER for any worsening abdominal pain, persistent vomiting, fevers, any other new or concerning symptoms

## 2024-02-27 NOTE — ED Notes (Signed)
 RT assessed patient due to SOB complaint. BBS clear, patient stated she has been vomiting after cancer treatment and very week. VS all WNL

## 2024-02-27 NOTE — ED Notes (Signed)
 ED Provider at bedside.

## 2024-02-27 NOTE — ED Notes (Signed)
Patient given ginger ale and saltines for PO challenge

## 2024-02-27 NOTE — ED Triage Notes (Signed)
 Pt began first round of chemo for colon cancer this week Pt has been vomiting since receiving chemo. Complains of lower abdominal and rectal pain. Taking oxy for pain. Zofran  and compazine  for nausea without relief

## 2024-02-28 ENCOUNTER — Ambulatory Visit

## 2024-02-28 ENCOUNTER — Telehealth: Payer: Self-pay | Admitting: *Deleted

## 2024-02-28 NOTE — Telephone Encounter (Signed)
 Call placed to check on patient's status after on call message received that pt was in the ER on 02/27/24 with chemotherapy induced N/V and hypokalemia.  Pt states that she is feeling much better and that nausea is somewhat under control with the Zofran  and Compazine  that she has at home.  She states that she is able to eat and drink with slight difficulty of nausea, but does not feel as though she needs to come in today for IVF's.  Dr. Timmy notified. Pt is appreciative of call and has no questions at this time.

## 2024-02-29 ENCOUNTER — Ambulatory Visit

## 2024-03-01 ENCOUNTER — Ambulatory Visit

## 2024-03-01 ENCOUNTER — Inpatient Hospital Stay: Attending: Hematology & Oncology | Admitting: Dietician

## 2024-03-01 DIAGNOSIS — D649 Anemia, unspecified: Secondary | ICD-10-CM | POA: Insufficient documentation

## 2024-03-01 DIAGNOSIS — G893 Neoplasm related pain (acute) (chronic): Secondary | ICD-10-CM | POA: Insufficient documentation

## 2024-03-01 DIAGNOSIS — C19 Malignant neoplasm of rectosigmoid junction: Secondary | ICD-10-CM | POA: Insufficient documentation

## 2024-03-01 DIAGNOSIS — Z5111 Encounter for antineoplastic chemotherapy: Secondary | ICD-10-CM | POA: Insufficient documentation

## 2024-03-01 DIAGNOSIS — Z5112 Encounter for antineoplastic immunotherapy: Secondary | ICD-10-CM | POA: Insufficient documentation

## 2024-03-01 DIAGNOSIS — Z5189 Encounter for other specified aftercare: Secondary | ICD-10-CM | POA: Insufficient documentation

## 2024-03-01 NOTE — Progress Notes (Signed)
 Nutrition Assessment: Reached out to patient at home telephone number.    Reason for Assessment: New Patient Assessment   ASSESSMENT: Patient is a 33 year old African-American female with metastatic mesorectal lymphadenopathy.  She has 4 children.  She has been fairly healthy.  Unfortunately, she presented to the ER at [facility] in early September.  She had some worsening abdominal pain.  She been having abdominal pain for about a month or so.  She had decreased appetite.  She noticed a change in her bowel habits.  Treatment plan is FOLFOX chemotherapy followed by chemoRT.  She was seen in ER after concern for nausea and vomiting that started after her first session of chemotherapy.  Patient reports feeling a little better, is using her anti emetics. No food allergies, doesn't eat pork, no milk (doesn't like the texture).   Bowels regular with Miralax   PO yesterday: Gingerale, water, sunny D Slice of pizza, few bananas.  Vomited last night.  Usual PO prior to nausea:  Breakfast 9am during work break, apple juice fruit, [place] Lunch: leftovers  Dinner:  Arts development officer, rice mashed potatoes, corn, baked beans, collard, peas or fast food ([employer] crispy chicken, chick filet),   Fluids: water, juice Kool Aid,   Nutrition Focused Physical Exam: unable to perform NFPE   Medications: Vit C, no MVI, Zofran , Compazine , oral K+ from hospital   Labs: 02/27/24  K+   Anthropometrics:   Height: 64 Weight:  02/23/24  103# 02/22/24  101# 02/16/24  100.5# UBW: 110# BMI: 17.68   Estimated Energy Needs  Kcals: 1400-1700 Protein: 56-71 g Fluid: 2 L   NUTRITION DIAGNOSIS: Inadequate PO intake to meet increased nutrient nutrient need with cancer  INTERVENTION:   Relayed that nutrition services are wrap around service provided at no charge and encouraged continued communication if experiencing any nutritional impact symptoms (NIS). Educated on importance of adequate  nourishment with calorie and protein energy intake with nutrient dense foods when possible to maintain weight/strength and QOL.   Discussed strategies for nausea. Reviewed K+ sources. Encouraged Vit D fortified orange juice, encouraged having PCP check levels Emailed Nutrition Tip sheet for Nausea and Potassium food sources to [email or web address] with contact information provided.  MONITORING, EVALUATION, GOAL: weight trends, nutrition impact symptoms, PO intake, labs   Next Visit: remote during infusion next month  Micheline Craven, RDN, LDN Registered Dietitian, [facility] Part Time Remote (Usual office hours: Tuesday-Thursday) Mobile: [phone]

## 2024-03-02 ENCOUNTER — Ambulatory Visit

## 2024-03-03 ENCOUNTER — Ambulatory Visit

## 2024-03-06 ENCOUNTER — Ambulatory Visit

## 2024-03-07 ENCOUNTER — Telehealth: Payer: Self-pay

## 2024-03-07 ENCOUNTER — Ambulatory Visit

## 2024-03-07 NOTE — Telephone Encounter (Signed)
 Amy Scott, the scheduler for genetics. LM with patient's information so she can get in contact to set up un appointment.

## 2024-03-08 ENCOUNTER — Inpatient Hospital Stay

## 2024-03-08 ENCOUNTER — Inpatient Hospital Stay: Admitting: Hematology & Oncology

## 2024-03-08 ENCOUNTER — Inpatient Hospital Stay: Admitting: Licensed Clinical Social Worker

## 2024-03-08 ENCOUNTER — Encounter: Payer: Self-pay | Admitting: *Deleted

## 2024-03-08 ENCOUNTER — Ambulatory Visit

## 2024-03-08 ENCOUNTER — Encounter: Payer: Self-pay | Admitting: Hematology & Oncology

## 2024-03-08 VITALS — BP 115/80 | HR 98 | Temp 99.7°F | Resp 20 | Ht 64.0 in | Wt 99.6 lb

## 2024-03-08 DIAGNOSIS — C19 Malignant neoplasm of rectosigmoid junction: Secondary | ICD-10-CM

## 2024-03-08 DIAGNOSIS — Z5189 Encounter for other specified aftercare: Secondary | ICD-10-CM | POA: Diagnosis not present

## 2024-03-08 DIAGNOSIS — Z5112 Encounter for antineoplastic immunotherapy: Secondary | ICD-10-CM | POA: Diagnosis present

## 2024-03-08 DIAGNOSIS — G893 Neoplasm related pain (acute) (chronic): Secondary | ICD-10-CM | POA: Diagnosis not present

## 2024-03-08 DIAGNOSIS — Z5111 Encounter for antineoplastic chemotherapy: Secondary | ICD-10-CM | POA: Diagnosis present

## 2024-03-08 DIAGNOSIS — D649 Anemia, unspecified: Secondary | ICD-10-CM | POA: Diagnosis not present

## 2024-03-08 LAB — CMP (CANCER CENTER ONLY)
ALT: 12 U/L (ref 0–44)
AST: 16 U/L (ref 15–41)
Albumin: 3.8 g/dL (ref 3.5–5.0)
Alkaline Phosphatase: 80 U/L (ref 38–126)
Anion gap: 15 (ref 5–15)
BUN: 6 mg/dL (ref 6–20)
CO2: 21 mmol/L — ABNORMAL LOW (ref 22–32)
Calcium: 8.9 mg/dL (ref 8.9–10.3)
Chloride: 107 mmol/L (ref 98–111)
Creatinine: 0.58 mg/dL (ref 0.44–1.00)
GFR, Estimated: >60 mL/min (ref >60)
Glucose, Bld: 91 mg/dL (ref 70–99)
Potassium: 3 mmol/L — ABNORMAL LOW (ref 3.5–5.1)
Sodium: 142 mmol/L (ref 135–145)
Total Bilirubin: 0.3 mg/dL (ref 0.0–1.2)
Total Protein: 6.6 g/dL (ref 6.5–8.1)

## 2024-03-08 LAB — CBC WITH DIFFERENTIAL (CANCER CENTER ONLY)
Abs Immature Granulocytes: 0.01 K/uL (ref 0.00–0.07)
Basophils Absolute: 0 K/uL (ref 0.0–0.1)
Basophils Relative: 0 %
Eosinophils Absolute: 0 K/uL (ref 0.0–0.5)
Eosinophils Relative: 1 %
HCT: 28.4 % — ABNORMAL LOW (ref 36.0–46.0)
Hemoglobin: 9.6 g/dL — ABNORMAL LOW (ref 12.0–15.0)
Immature Granulocytes: 0 %
Lymphocytes Relative: 59 %
Lymphs Abs: 1.8 K/uL (ref 0.7–4.0)
MCH: 26.9 pg (ref 26.0–34.0)
MCHC: 33.8 g/dL (ref 30.0–36.0)
MCV: 79.6 fL — ABNORMAL LOW (ref 80.0–100.0)
Monocytes Absolute: 0.3 K/uL (ref 0.1–1.0)
Monocytes Relative: 9 %
Neutro Abs: 0.9 K/uL — ABNORMAL LOW (ref 1.7–7.7)
Neutrophils Relative %: 31 %
Platelet Count: 266 K/uL (ref 150–400)
RBC: 3.57 MIL/uL — ABNORMAL LOW (ref 3.87–5.11)
RDW: 15.5 % (ref 11.5–15.5)
Smear Review: NORMAL
WBC Count: 3.1 K/uL — ABNORMAL LOW (ref 4.0–10.5)
nRBC: 0 % (ref 0.0–0.2)

## 2024-03-08 LAB — PREGNANCY, URINE: Preg Test, Ur: NEGATIVE

## 2024-03-08 MED ORDER — OXALIPLATIN CHEMO INJECTION 100 MG/20ML
85.0000 mg/m2 | Freq: Once | INTRAVENOUS | Status: AC
  Administered 2024-03-08: 125 mg via INTRAVENOUS
  Filled 2024-03-08: qty 20

## 2024-03-08 MED ORDER — FLUOROURACIL CHEMO INJECTION 2.5 GM/50ML
400.0000 mg/m2 | Freq: Once | INTRAVENOUS | Status: AC
  Administered 2024-03-08: 600 mg via INTRAVENOUS
  Filled 2024-03-08: qty 12

## 2024-03-08 MED ORDER — POTASSIUM CHLORIDE CRYS ER 20 MEQ PO TBCR
40.0000 meq | EXTENDED_RELEASE_TABLET | Freq: Two times a day (BID) | ORAL | Status: DC
  Administered 2024-03-08: 40 meq via ORAL
  Filled 2024-03-08 (×2): qty 2

## 2024-03-08 MED ORDER — PALONOSETRON HCL INJECTION 0.25 MG/5ML
0.2500 mg | Freq: Once | INTRAVENOUS | Status: AC
  Administered 2024-03-08: 0.25 mg via INTRAVENOUS
  Filled 2024-03-08: qty 5

## 2024-03-08 MED ORDER — SODIUM CHLORIDE 0.9 % IV SOLN
2000.0000 mg/m2 | INTRAVENOUS | Status: DC
  Administered 2024-03-08: 2900 mg via INTRAVENOUS
  Filled 2024-03-08: qty 58

## 2024-03-08 MED ORDER — SODIUM CHLORIDE 0.9 % IV SOLN
5.0000 mg/kg | Freq: Once | INTRAVENOUS | Status: AC
  Administered 2024-03-08: 225 mg via INTRAVENOUS
  Filled 2024-03-08: qty 9

## 2024-03-08 MED ORDER — SODIUM CHLORIDE 0.9 % IV SOLN
180.0000 mg/m2 | Freq: Once | INTRAVENOUS | Status: AC
  Administered 2024-03-08: 260 mg via INTRAVENOUS
  Filled 2024-03-08: qty 5

## 2024-03-08 MED ORDER — DEXAMETHASONE SODIUM PHOSPHATE 10 MG/ML IJ SOLN
10.0000 mg | Freq: Once | INTRAMUSCULAR | Status: AC
  Administered 2024-03-08: 10 mg via INTRAVENOUS
  Filled 2024-03-08: qty 1

## 2024-03-08 MED ORDER — ATROPINE SULFATE 1 MG/ML IV SOLN
0.5000 mg | Freq: Once | INTRAVENOUS | Status: AC | PRN
  Administered 2024-03-08: 0.5 mg via INTRAVENOUS
  Filled 2024-03-08: qty 1

## 2024-03-08 MED ORDER — SODIUM CHLORIDE 0.9 % IV SOLN
150.0000 mg | Freq: Once | INTRAVENOUS | Status: AC
  Administered 2024-03-08: 150 mg via INTRAVENOUS
  Filled 2024-03-08: qty 150

## 2024-03-08 MED ORDER — SODIUM CHLORIDE 0.9 % IV SOLN
400.0000 mg/m2 | Freq: Once | INTRAVENOUS | Status: AC
  Administered 2024-03-08: 584 mg via INTRAVENOUS
  Filled 2024-03-08: qty 29.2

## 2024-03-08 MED ORDER — SODIUM CHLORIDE 0.9 % IV SOLN: INTRAVENOUS | Status: DC

## 2024-03-08 MED ORDER — DEXTROSE 5 % IV SOLN: INTRAVENOUS | Status: DC

## 2024-03-08 NOTE — Progress Notes (Signed)
 Hematology and Oncology Follow Up Visit  Amy Scott [id number] 06-19-1990 33 y.o. 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  She had her first cycle of chemotherapy.  She seems to tolerate it fairly well.  I think she may have been hospitalized.  I do not know if she was dehydrated.  She says there has been no bleeding.  When she got constipated, she had a little bit of rectal bleeding.  Thankfully, all of her studies so far have not shown any evidence of metastasis.  She does not really have a high CEA level.  Will return from CEA level was 15.5.  She does not complain of any pain.  She not complaining of any cough or shortness of breath.  She is little nauseated.  She does use marijuana which I totally agree with.  This has helped her quite a bit.  She has had no leg swelling.  She has had no fever.  She has had no vomiting.  She has had no mouth sores.  She has had no tingling in the hands or feet.  Overall, I would have to say that her performance status is probably ECOG 1.  Medications:  Current Outpatient Medications:    Acetaminophen  Extra Strength 500 MG CAPS, Take 2 tablets by mouth 3 (three) times daily as needed. DO NOT EXCEED 6 PILLS OR 3000 MG IN A 24 HOUR PERIOD., Disp: 60 capsule, Rfl: 1   dexamethasone  (DECADRON ) 4 MG tablet, Take 2 tablets (8 mg total) by mouth daily. Take 2 tablets daily x 3 days starting the day after chemotherapy. Take with food., Disp: 30 tablet, Rfl: 1   ibuprofen  (ADVIL ) 800 MG tablet, Take 1 tablet (800 mg total) by mouth 3 (three) times daily as needed., Disp: 30 tablet, Rfl: 0   lidocaine -prilocaine  (EMLA ) cream, Apply 1 Application topically as needed., Disp: 30 g, Rfl: 0   ondansetron  (ZOFRAN ) 8 MG tablet, Take 1 tablet (8  mg total) by mouth every 8 (eight) hours as needed for nausea or vomiting. Start on the third day after chemotherapy, Disp: 30 tablet, Rfl: 1   oxyCODONE  (OXY IR/ROXICODONE ) 5 MG immediate release tablet, Take 1-2 tablets (5-10 mg total) by mouth every 6 (six) hours as needed for moderate pain (pain score 4-6)., Disp: 120 tablet, Rfl: 0   polyethylene glycol powder (GLYCOLAX /MIRALAX ) 17 GM/SCOOP powder, Take 17 g dissolved in liquid by mouth 2 (two) times daily., Disp: 238 g, Rfl: 0   prochlorperazine  (COMPAZINE ) 10 MG tablet, Take 1 tablet (10 mg total) by mouth every 6 (six) hours as needed for nausea or vomiting (Nausea or vomiting)., Disp: 30 tablet, Rfl: 1   feeding supplement (ENSURE PLUS HIGH PROTEIN) LIQD, Take 237 mLs by mouth 2 (two) times daily between meals. (Patient not taking: Reported on 03/08/2024), Disp: 5000 mL, Rfl: 0   loperamide  (IMODIUM ) 2 MG capsule, Take 2 tabs by mouth with first loose stool, then 1 tab with each additional loose stool as needed. Do not exceed 8 tabs in a 24-hour period (Patient not taking: Reported on 03/08/2024), Disp: 60 capsule, Rfl: 3  Allergies:  Allergies  Allergen Reactions   Morphine And Codeine Hives, Shortness Of Breath, Itching and Swelling    Past Medical History, Surgical history, Social history, and Family History  were reviewed and updated.  Review of Systems: Review of Systems  Constitutional: Negative.   HENT:  Negative.    Eyes: Negative.   Respiratory: Negative.    Cardiovascular: Negative.   Gastrointestinal: Negative.   Endocrine: Negative.   Genitourinary: Negative.    Musculoskeletal: Negative.   Skin: Negative.   Neurological: Negative.   Hematological: Negative.   Psychiatric/Behavioral: Negative.      Physical Exam:  height is 5' 4 (1.626 m) and weight is 99 lb 9.6 oz (45.2 kg). Her oral temperature is 99.7 F (37.6 C). Her blood pressure is 115/80 and her pulse is 98. Her respiration is 20 and oxygen saturation is  97%.   Wt Readings from Last 3 Encounters:  03/08/24 99 lb 9.6 oz (45.2 kg)  02/23/24 103 lb (46.7 kg)  02/23/24 103 lb (46.7 kg)    Physical Exam Vitals reviewed.  HENT:     Head: Normocephalic and atraumatic.  Eyes:     Pupils: Pupils are equal, round, and reactive to light.  Cardiovascular:     Rate and Rhythm: Normal rate and regular rhythm.     Heart sounds: Normal heart sounds.  Pulmonary:     Effort: Pulmonary effort is normal.     Breath sounds: Normal breath sounds.  Abdominal:     General: Bowel sounds are normal.     Palpations: Abdomen is soft.  Musculoskeletal:        General: No tenderness or deformity. Normal range of motion.     Cervical back: Normal range of motion.  Lymphadenopathy:     Cervical: No cervical adenopathy.  Skin:    General: Skin is warm and dry.     Findings: No erythema or rash.  Neurological:     Mental Status: She is alert and oriented to person, place, and time.  Psychiatric:        Behavior: Behavior normal.        Thought Content: Thought content normal.        Judgment: Judgment normal.      Lab Results  Component Value Date   WBC 3.1 (L) 03/08/2024   HGB 9.6 (L) 03/08/2024   HCT 28.4 (L) 03/08/2024   MCV 79.6 (L) 03/08/2024   PLT 266 03/08/2024     Chemistry      Component Value Date/Time   NA 142 03/08/2024 0929   K 3.0 (L) 03/08/2024 0929   CL 107 03/08/2024 0929   CO2 21 (L) 03/08/2024 0929   BUN 6 03/08/2024 0929   CREATININE 0.58 03/08/2024 0929      Component Value Date/Time   CALCIUM  8.9 03/08/2024 0929   ALKPHOS 80 03/08/2024 0929   AST 16 03/08/2024 0929   ALT 12 03/08/2024 0929   BILITOT 0.3 03/08/2024 0929      Impression and Plan: Amy Scott is a very charming 33 year old Afro-American female.  She just had a birthday.  She is awfully young to have such a locally advanced rectal cancer.  We are trying to treat her neoadjuvantly to shrink things down so that she will be able to undergo surgery.   Again, our goal here is surgical resection.  We will go ahead with her second cycle of treatment.  Hopefully, we will be able to prevent her from being hospitalized.  What we may have to do is get her back for some IV fluids.  I think she is going to have to have Neulasta.  Her ANC is 900.  I  really need to treat her.  Again we have to try to be aggressive and try to get dose dense chemotherapy into her so that we can try to get the best response so that we can hopefully get her cured.  We will still plan to get her back in a couple of weeks for her third cycle of treatment.  I probably will plan on doing 6 cycles of treatment and then repeating our scans and seeing how everything looks.     [name], MD 10/8/202510:18 AM

## 2024-03-08 NOTE — Progress Notes (Signed)
 [facility] CSW Progress Note  Visual merchandiser met with patient to follow-up on financial concerns.    Interventions: Provided patient with information about her approval of the [employer] and gas cards.  CSW asked Dickey Fritter to follow up with patient regarding grant guidelines.  Also requested Rhonda Flynt provide patient with a gas card at conclusion of her treatment today.  Patient had no other needs.       Follow Up Plan:  CSW will follow-up with patient by phone     [name], LCSW Clinical Social Worker [facility]    Patient is participating in a Managed Medicaid Plan:  Yes

## 2024-03-08 NOTE — Progress Notes (Signed)
 OK to treat with WBC-3.1 and ANC-0.9 per order of Dr. Timmy.

## 2024-03-08 NOTE — Progress Notes (Signed)
 Patient had fair tolerance of her first cycle. She did have an ER visit for dehydration and pain where she received symptom management. She will proceed with second cycle today.   Spoke to patient. She states the last cycle could have been better. Encouraged her to schedule a visit next week for fluids and possible symptom management. She doesn't wish to do that at this time. She has my number and if she doesn't feel well, she will call me to schedule something.   Oncology Nurse Navigator Documentation     03/08/2024   10:00 AM  Oncology Nurse Navigator Flowsheets  Navigator Follow Up Date: 03/22/2024  Navigator Follow Up Reason: Follow-up Appointment;Chemotherapy  Navigator Radiographer, therapeutic Encounter Type Treatment  Patient Visit Type MedOnc  Treatment Phase Active Tx  Barriers/Navigation Needs Coordination of Care;Education  Education Other  Interventions Education;Psycho-Social Support  Acuity Level 2-Minimal Needs (1-2 Barriers Identified)  Education Method Verbal  Support Groups/Services Friends and Family  Time Spent with Patient 15

## 2024-03-08 NOTE — Patient Instructions (Signed)

## 2024-03-09 ENCOUNTER — Telehealth: Payer: Self-pay | Admitting: Pharmacy Technician

## 2024-03-09 ENCOUNTER — Other Ambulatory Visit: Payer: Self-pay

## 2024-03-09 ENCOUNTER — Ambulatory Visit

## 2024-03-09 NOTE — Telephone Encounter (Signed)
 Spoke with patient regarding how the [employer] works.  Patient inquiring about whether grant funds could be paid directly to her.  I explained that we do not pay patients directly but rather pay invoices or bills for approved expenses.  We submit a check request to [place]'s Accounts Payable department who generate the check.  It takes 2 to 3 weeks to process the request.    Patient also inquired as to how many gas cards could be obtained within a week and I explained that only one gas card is given per week.  Patient also asked if she could obtain a gas card if she did not have an appointment and I told patient that she would not receive one if she was not there for an appointment.  Patient verbally acknowledged understanding about the reimbursement process for bills and the gas cards.  Dickey DOROTHA Fritter Patient Pharmacologist [facility]

## 2024-03-10 ENCOUNTER — Inpatient Hospital Stay

## 2024-03-10 ENCOUNTER — Ambulatory Visit

## 2024-03-10 VITALS — BP 141/85 | HR 77 | Temp 98.4°F

## 2024-03-10 DIAGNOSIS — C19 Malignant neoplasm of rectosigmoid junction: Secondary | ICD-10-CM

## 2024-03-10 DIAGNOSIS — Z5112 Encounter for antineoplastic immunotherapy: Secondary | ICD-10-CM | POA: Diagnosis not present

## 2024-03-10 MED ORDER — PEGFILGRASTIM-CBQV 6 MG/0.6ML ~~LOC~~ SOSY
6.0000 mg | PREFILLED_SYRINGE | Freq: Once | SUBCUTANEOUS | Status: AC
  Administered 2024-03-10: 6 mg via SUBCUTANEOUS
  Filled 2024-03-10: qty 0.6

## 2024-03-10 NOTE — Patient Instructions (Signed)

## 2024-03-13 ENCOUNTER — Ambulatory Visit

## 2024-03-14 ENCOUNTER — Ambulatory Visit

## 2024-03-15 ENCOUNTER — Ambulatory Visit

## 2024-03-16 ENCOUNTER — Ambulatory Visit

## 2024-03-17 ENCOUNTER — Ambulatory Visit

## 2024-03-20 ENCOUNTER — Ambulatory Visit

## 2024-03-20 ENCOUNTER — Ambulatory Visit: Admitting: Radiation Oncology

## 2024-03-21 ENCOUNTER — Ambulatory Visit

## 2024-03-22 ENCOUNTER — Inpatient Hospital Stay (HOSPITAL_BASED_OUTPATIENT_CLINIC_OR_DEPARTMENT_OTHER): Admitting: Medical Oncology

## 2024-03-22 ENCOUNTER — Other Ambulatory Visit

## 2024-03-22 ENCOUNTER — Ambulatory Visit

## 2024-03-22 ENCOUNTER — Inpatient Hospital Stay

## 2024-03-22 ENCOUNTER — Inpatient Hospital Stay: Admitting: Dietician

## 2024-03-22 ENCOUNTER — Ambulatory Visit: Admitting: Family

## 2024-03-22 ENCOUNTER — Inpatient Hospital Stay: Admitting: Licensed Clinical Social Worker

## 2024-03-22 VITALS — BP 131/96 | HR 100 | Temp 98.0°F

## 2024-03-22 VITALS — HR 80 | Ht 64.0 in | Wt 104.0 lb

## 2024-03-22 DIAGNOSIS — G893 Neoplasm related pain (acute) (chronic): Secondary | ICD-10-CM

## 2024-03-22 DIAGNOSIS — D649 Anemia, unspecified: Secondary | ICD-10-CM | POA: Diagnosis not present

## 2024-03-22 DIAGNOSIS — C19 Malignant neoplasm of rectosigmoid junction: Secondary | ICD-10-CM

## 2024-03-22 DIAGNOSIS — Z5112 Encounter for antineoplastic immunotherapy: Secondary | ICD-10-CM | POA: Diagnosis not present

## 2024-03-22 LAB — CMP (CANCER CENTER ONLY)
ALT: 16 U/L (ref 0–44)
AST: 17 U/L (ref 15–41)
Albumin: 4.1 g/dL (ref 3.5–5.0)
Alkaline Phosphatase: 128 U/L — ABNORMAL HIGH (ref 38–126)
Anion gap: 11 (ref 5–15)
BUN: 5 mg/dL — ABNORMAL LOW (ref 6–20)
CO2: 23 mmol/L (ref 22–32)
Calcium: 9.1 mg/dL (ref 8.9–10.3)
Chloride: 106 mmol/L (ref 98–111)
Creatinine: 0.57 mg/dL (ref 0.44–1.00)
GFR, Estimated: >60 mL/min (ref >60)
Glucose, Bld: 104 mg/dL — ABNORMAL HIGH (ref 70–99)
Potassium: 3.8 mmol/L (ref 3.5–5.1)
Sodium: 140 mmol/L (ref 135–145)
Total Bilirubin: <0.2 mg/dL (ref 0.0–1.2)
Total Protein: 6.7 g/dL (ref 6.5–8.1)

## 2024-03-22 LAB — PREGNANCY, URINE: Preg Test, Ur: NEGATIVE

## 2024-03-22 LAB — CBC WITH DIFFERENTIAL (CANCER CENTER ONLY)
Abs Immature Granulocytes: 0.42 K/uL — ABNORMAL HIGH (ref 0.00–0.07)
Basophils Absolute: 0.1 K/uL (ref 0.0–0.1)
Basophils Relative: 1 %
Eosinophils Absolute: 0 K/uL (ref 0.0–0.5)
Eosinophils Relative: 0 %
HCT: 32.3 % — ABNORMAL LOW (ref 36.0–46.0)
Hemoglobin: 10.6 g/dL — ABNORMAL LOW (ref 12.0–15.0)
Immature Granulocytes: 3 %
Lymphocytes Relative: 23 %
Lymphs Abs: 3.3 K/uL (ref 0.7–4.0)
MCH: 27 pg (ref 26.0–34.0)
MCHC: 32.8 g/dL (ref 30.0–36.0)
MCV: 82.2 fL (ref 80.0–100.0)
Monocytes Absolute: 1 K/uL (ref 0.1–1.0)
Monocytes Relative: 7 %
Neutro Abs: 9.4 K/uL — ABNORMAL HIGH (ref 1.7–7.7)
Neutrophils Relative %: 66 %
Platelet Count: 220 K/uL (ref 150–400)
RBC: 3.93 MIL/uL (ref 3.87–5.11)
RDW: 18.1 % — ABNORMAL HIGH (ref 11.5–15.5)
Smear Review: NORMAL
WBC Count: 14.2 K/uL — ABNORMAL HIGH (ref 4.0–10.5)
nRBC: 1.2 % — ABNORMAL HIGH (ref 0.0–0.2)

## 2024-03-22 LAB — IRON AND IRON BINDING CAPACITY (CC-WL,HP ONLY)
Iron: 30 ug/dL (ref 28–170)
Saturation Ratios: 8 % — ABNORMAL LOW (ref 10.4–31.8)
TIBC: 400 ug/dL (ref 250–450)
UIBC: 370 ug/dL

## 2024-03-22 LAB — FERRITIN: Ferritin: 61 ng/mL (ref 11–307)

## 2024-03-22 LAB — CEA (ACCESS): CEA (CHCC): 4.32 ng/mL (ref 0.00–5.00)

## 2024-03-22 LAB — TOTAL PROTEIN, URINE DIPSTICK

## 2024-03-22 MED ORDER — ATROPINE SULFATE 1 MG/ML IV SOLN
0.5000 mg | Freq: Once | INTRAVENOUS | Status: AC | PRN
  Administered 2024-03-22: 0.5 mg via INTRAVENOUS
  Filled 2024-03-22: qty 1

## 2024-03-22 MED ORDER — DEXAMETHASONE SOD PHOSPHATE PF 10 MG/ML IJ SOLN
10.0000 mg | Freq: Once | INTRAMUSCULAR | Status: AC
  Administered 2024-03-22: 10 mg via INTRAVENOUS

## 2024-03-22 MED ORDER — DEXTROSE 5 % IV SOLN: INTRAVENOUS | Status: DC

## 2024-03-22 MED ORDER — IBUPROFEN 800 MG PO TABS: 800.0000 mg | ORAL_TABLET | Freq: Three times a day (TID) | ORAL | 0 refills | Status: DC | PRN

## 2024-03-22 MED ORDER — LORAZEPAM 2 MG/ML IJ SOLN: 0.2500 mg | Freq: Once | INTRAMUSCULAR | Status: DC

## 2024-03-22 MED ORDER — PALONOSETRON HCL INJECTION 0.25 MG/5ML
0.2500 mg | Freq: Once | INTRAVENOUS | Status: AC
  Administered 2024-03-22: 0.25 mg via INTRAVENOUS
  Filled 2024-03-22: qty 5

## 2024-03-22 MED ORDER — SODIUM CHLORIDE 0.9 % IV SOLN: INTRAVENOUS | Status: DC

## 2024-03-22 MED ORDER — SODIUM CHLORIDE 0.9 % IV SOLN
2000.0000 mg/m2 | INTRAVENOUS | Status: DC
  Administered 2024-03-22: 2900 mg via INTRAVENOUS
  Filled 2024-03-22: qty 58

## 2024-03-22 MED ORDER — FLUOROURACIL CHEMO INJECTION 2.5 GM/50ML
400.0000 mg/m2 | Freq: Once | INTRAVENOUS | Status: AC
  Administered 2024-03-22: 600 mg via INTRAVENOUS
  Filled 2024-03-22: qty 12

## 2024-03-22 MED ORDER — SODIUM CHLORIDE 0.9 % IV SOLN
5.0000 mg/kg | Freq: Once | INTRAVENOUS | Status: AC
  Administered 2024-03-22: 225 mg via INTRAVENOUS
  Filled 2024-03-22: qty 9

## 2024-03-22 MED ORDER — OXALIPLATIN CHEMO INJECTION 100 MG/20ML
85.0000 mg/m2 | Freq: Once | INTRAVENOUS | Status: AC
  Administered 2024-03-22: 125 mg via INTRAVENOUS
  Filled 2024-03-22: qty 20

## 2024-03-22 MED ORDER — SODIUM CHLORIDE 0.9 % IV SOLN
180.0000 mg/m2 | Freq: Once | INTRAVENOUS | Status: AC
  Administered 2024-03-22: 260 mg via INTRAVENOUS
  Filled 2024-03-22: qty 5

## 2024-03-22 MED ORDER — SODIUM CHLORIDE 0.9 % IV SOLN
400.0000 mg/m2 | Freq: Once | INTRAVENOUS | Status: AC
  Administered 2024-03-22: 584 mg via INTRAVENOUS
  Filled 2024-03-22: qty 29.2

## 2024-03-22 MED ORDER — SODIUM CHLORIDE 0.9 % IV SOLN
150.0000 mg | Freq: Once | INTRAVENOUS | Status: AC
  Administered 2024-03-22: 150 mg via INTRAVENOUS
  Filled 2024-03-22: qty 150

## 2024-03-22 NOTE — Progress Notes (Signed)
 Hematology and Oncology Follow Up Visit  Amy Scott [id number] 1990-10-02 33 y.o. 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  She is here with her husband.   Today she states that she has been well. She has no concerns and is tolerating treatment well.   There has been no bleeding to her knowledge: denies epistaxis, gingivitis, hemoptysis, hematemesis, hematuria, melena, excessive bruising, blood donation.   Thankfully, all of her studies so far have not shown any evidence of metastasis.  She does not really have a high CEA level.  Will return from CEA level was 15.5.  She does not complain of any pain.  She not complaining of any cough or shortness of breath.  She is little nauseated.  She does use marijuana which I totally agree with.  This has helped her quite a bit.  She has had no leg swelling.  She has had no fever.  She has had no vomiting.  She has had no mouth sores.  She has had no tingling in the hands or feet.  Overall, I would have to say that her performance status is probably ECOG 1. Wt Readings from Last 3 Encounters:  03/22/24 104 lb (47.2 kg)  03/08/24 99 lb 9.6 oz (45.2 kg)  02/23/24 103 lb (46.7 kg)     Medications:  Current Outpatient Medications:    Acetaminophen  Extra Strength 500 MG CAPS, Take 2 tablets by mouth 3 (three) times daily as needed. DO NOT EXCEED 6 PILLS OR 3000 MG IN A 24 HOUR PERIOD., Disp: 60 capsule, Rfl: 1   dexamethasone  (DECADRON ) 4 MG tablet, Take 2 tablets (8 mg total) by mouth daily. Take 2 tablets daily x 3 days starting the day after chemotherapy. Take with food., Disp: 30 tablet, Rfl: 1   feeding supplement (ENSURE PLUS HIGH PROTEIN) LIQD, Take 237 mLs by mouth 2 (two) times daily between meals., Disp: 5000 mL,  Rfl: 0   ibuprofen  (ADVIL ) 800 MG tablet, Take 1 tablet (800 mg total) by mouth 3 (three) times daily as needed., Disp: 30 tablet, Rfl: 0   lidocaine -prilocaine  (EMLA ) cream, Apply 1 Application topically as needed., Disp: 30 g, Rfl: 0   loperamide  (IMODIUM ) 2 MG capsule, Take 2 tabs by mouth with first loose stool, then 1 tab with each additional loose stool as needed. Do not exceed 8 tabs in a 24-hour period, Disp: 60 capsule, Rfl: 3   ondansetron  (ZOFRAN ) 8 MG tablet, Take 1 tablet (8 mg total) by mouth every 8 (eight) hours as needed for nausea or vomiting. Start on the third day after chemotherapy, Disp: 30 tablet, Rfl: 1   oxyCODONE  (OXY IR/ROXICODONE ) 5 MG immediate release tablet, Take 1-2 tablets (5-10 mg total) by mouth every 6 (six) hours as needed for moderate pain (pain score 4-6)., Disp: 120 tablet, Rfl: 0   polyethylene glycol powder (GLYCOLAX /MIRALAX ) 17 GM/SCOOP powder, Take 17 g dissolved in liquid by mouth 2 (two) times daily., Disp: 238 g, Rfl: 0   prochlorperazine  (COMPAZINE ) 10 MG tablet, Take 1 tablet (10 mg total) by mouth every 6 (six) hours as needed for nausea or vomiting (Nausea or vomiting)., Disp: 30 tablet, Rfl: 1 No current facility-administered medications for this visit.  Facility-Administered Medications Ordered in Other Visits:    0.9 %  sodium  chloride infusion, , Intravenous, Continuous, Ennever, Maude SAUNDERS, MD, Last Rate: 10 mL/hr at 03/22/24 1059, New Bag at 03/22/24 1059   atropine  injection 0.5 mg, 0.5 mg, Intravenous, Once PRN, Ennever, Peter R, MD   dextrose  5 % solution, , Intravenous, Continuous, Ennever, Maude SAUNDERS, MD, Last Rate: 10 mL/hr at 03/22/24 1237, New Bag at 03/22/24 1237   fluorouracil  (ADRUCIL ) 2,900 mg in sodium chloride  0.9 % 92 mL chemo infusion, 2,000 mg/m2 (Treatment Plan Recorded), Intravenous, 1 day or 1 dose, Ennever, Maude SAUNDERS, MD   fluorouracil  (ADRUCIL ) chemo injection 600 mg, 400 mg/m2 (Treatment Plan Recorded), Intravenous, Once, Ennever,  Maude SAUNDERS, MD   irinotecan  (CAMPTOSAR ) 260 mg in sodium chloride  0.9 % 500 mL chemo infusion, 180 mg/m2 (Treatment Plan Recorded), Intravenous, Once, Ennever, Maude SAUNDERS, MD   leucovorin  584 mg in sodium chloride  0.9 % 250 mL infusion, 400 mg/m2 (Treatment Plan Recorded), Intravenous, Once, Ennever, Maude SAUNDERS, MD   oxaliplatin  (ELOXATIN ) 125 mg in dextrose  5 % 500 mL chemo infusion, 85 mg/m2 (Treatment Plan Recorded), Intravenous, Once, Timmy Maude SAUNDERS, MD, Last Rate: 263 mL/hr at 03/22/24 1239, 125 mg at 03/22/24 1239  Allergies:  Allergies  Allergen Reactions   Morphine And Codeine Hives, Shortness Of Breath, Itching and Swelling    Past Medical History, Surgical history, Social history, and Family History were reviewed and updated.  Review of Systems: Review of Systems  Constitutional: Negative.   HENT:  Negative.    Eyes: Negative.   Respiratory: Negative.    Cardiovascular: Negative.   Gastrointestinal: Negative.   Endocrine: Negative.   Genitourinary: Negative.    Musculoskeletal: Negative.   Skin: Negative.   Neurological: Negative.   Hematological: Negative.   Psychiatric/Behavioral: Negative.      Physical Exam:  oral temperature is 98 F (36.7 C). Her blood pressure is 131/96 (abnormal) and her pulse is 100. Her oxygen saturation is 98%.   Wt Readings from Last 3 Encounters:  03/22/24 104 lb (47.2 kg)  03/08/24 99 lb 9.6 oz (45.2 kg)  02/23/24 103 lb (46.7 kg)    Physical Exam Vitals reviewed.  HENT:     Head: Normocephalic and atraumatic.  Eyes:     Pupils: Pupils are equal, round, and reactive to light.  Cardiovascular:     Rate and Rhythm: Normal rate and regular rhythm.     Heart sounds: Normal heart sounds.  Pulmonary:     Effort: Pulmonary effort is normal.     Breath sounds: Normal breath sounds.  Abdominal:     General: Bowel sounds are normal.     Palpations: Abdomen is soft.  Musculoskeletal:        General: No tenderness or deformity. Normal  range of motion.     Cervical back: Normal range of motion.  Lymphadenopathy:     Cervical: No cervical adenopathy.  Skin:    General: Skin is warm and dry.     Findings: No erythema or rash.  Neurological:     Mental Status: She is alert and oriented to person, place, and time.  Psychiatric:        Behavior: Behavior normal.        Thought Content: Thought content normal.        Judgment: Judgment normal.      Lab Results  Component Value Date   WBC 14.2 (H) 03/22/2024   HGB 10.6 (L) 03/22/2024   HCT 32.3 (L) 03/22/2024   MCV 82.2 03/22/2024   PLT 220 03/22/2024  Chemistry      Component Value Date/Time   NA 140 03/22/2024 0930   K 3.8 03/22/2024 0930   CL 106 03/22/2024 0930   CO2 23 03/22/2024 0930   BUN 5 (L) 03/22/2024 0930   CREATININE 0.57 03/22/2024 0930      Component Value Date/Time   CALCIUM  9.1 03/22/2024 0930   ALKPHOS 128 (H) 03/22/2024 0930   AST 17 03/22/2024 0930   ALT 16 03/22/2024 0930   BILITOT <0.2 03/22/2024 0930     Encounter Diagnoses  Name Primary?   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 Jester is a very charming 33 year old African American female.  She has locally advanced rectal cancer.She is being treated neoadjuvantly to shrink things down so that she will be able to undergo surgery.  Again, our goal here is surgical resection. Plan is for 6 cycles of FOLFIRINOX and then to repeat imaging.   She is tolerating treatment well.  Today CBC and CMP acceptable for treatment- Day 1 Cycle 3  RTC 2 days pump D/C RTC 2 weeks MD, port labs, Day 1 Cycle 4    Lauraine CHRISTELLA Dais, [place] 10/22/20251:07 PM

## 2024-03-22 NOTE — Progress Notes (Signed)
 OK to treat with today's ANC value per Lauraine Dais, PA.At 1625 patient compalined of nausea 7/10, no vomiting. Lauraine Dais aware. Infusion stopped and 250 ml of NS verbal order received.

## 2024-03-22 NOTE — Progress Notes (Signed)
 [facility] CSW Progress Note  Visual merchandiser met with patient to follow-up on disability concerns.    Interventions: CSW confirmed patient was contacted by the [facility] regarding her social security disability referral.  She stated she has spoken with them and filled out the paperwork.  CSW also obtained a gas card for her from [place] at the front desk.      Follow Up Plan:  Patient will contact CSW with any support or resource needs    [name], LCSW Clinical Social Worker [facility]    Patient is participating in a Managed Medicaid Plan:  Yes

## 2024-03-22 NOTE — Progress Notes (Signed)
 Nutrition Follow Up: Reached out to patient at mobile telephone number during infusion.    ASSESSMENT: Patient is a 33 year old African-American female with metastatic mesorectal lymphadenopathy.  She has 4 children.  She has been fairly healthy.  Unfortunately, she presented to the ER at [facility] in early September.  She had some worsening abdominal pain.  She been having abdominal pain for about a month or so.    Treatment plan is FOLFOX chemotherapy followed by chemoRT.    Patient reports feeling a little better, appetite improved. No food allergies, doesn't eat pork, no milk (doesn't like the texture).  She is trying to eat more baked foods.  Had chicken mini's for breakfast and Latte from [employer].  She didn't plan for snack during 4 hour infusion but states she is trying to eat more frequent meals.  Bowels regular last DM this morning.  Denies N/V/ or other NIS at this time.  Nutrition Focused Physical Exam: unable to perform NFPE   Medications: reviewed   Labs: 03/22/24  BUN 5, Hgb 10.6   Anthropometrics:  Patient reports not weighed yet today. Down 4# (3.8%) less than 1 month  Height: 64 Weight:  03/08/24  99.6# 02/23/24  103# 02/22/24  101# 02/16/24  100.5# UBW: 110# BMI: 17.1   Estimated Energy Needs  Kcals: 1400-1700 Protein: 56-71 g Fluid: 2 L   NUTRITION DIAGNOSIS: Inadequate PO intake to meet increased nutrient nutrient need with cancer. Ongoing  INTERVENTION:    Educated on importance of adequate nourishment with calorie and protein energy intake with nutrient dense foods when possible to maintain weight/strength and QOL.    Discussed strategies for protein pacing.  Encouraged packing small meal or snack to have during her infusions with protein source.  [email or web address] in her email,  contact information was provided last month, encouraged patient to reach put of have scheduling add to my schedule if she has any NIS between now and  next month.  MONITORING, EVALUATION, GOAL: weight trends, nutrition impact symptoms, PO intake, labs   Next Visit: remote during infusion next month  Micheline Craven, RDN, LDN Registered Dietitian, [facility] Part Time Remote (Usual office hours: Tuesday-Thursday) Mobile: [phone]

## 2024-03-23 ENCOUNTER — Ambulatory Visit: Payer: Self-pay | Admitting: Hematology & Oncology

## 2024-03-23 ENCOUNTER — Ambulatory Visit

## 2024-03-24 ENCOUNTER — Inpatient Hospital Stay

## 2024-03-24 ENCOUNTER — Encounter: Payer: Self-pay | Admitting: Hematology & Oncology

## 2024-03-24 ENCOUNTER — Ambulatory Visit

## 2024-03-24 ENCOUNTER — Encounter: Payer: Self-pay | Admitting: *Deleted

## 2024-03-24 VITALS — BP 126/88 | HR 67 | Temp 98.6°F | Resp 18

## 2024-03-24 DIAGNOSIS — Z5112 Encounter for antineoplastic immunotherapy: Secondary | ICD-10-CM | POA: Diagnosis not present

## 2024-03-24 DIAGNOSIS — C19 Malignant neoplasm of rectosigmoid junction: Secondary | ICD-10-CM

## 2024-03-24 MED ORDER — PEGFILGRASTIM-CBQV 6 MG/0.6ML ~~LOC~~ SOSY
6.0000 mg | PREFILLED_SYRINGE | Freq: Once | SUBCUTANEOUS | Status: AC
  Administered 2024-03-24: 6 mg via SUBCUTANEOUS
  Filled 2024-03-24: qty 0.6

## 2024-03-24 MED ORDER — SODIUM CHLORIDE 0.9 % IV SOLN: INTRAVENOUS | Status: DC

## 2024-03-24 MED ORDER — SODIUM CHLORIDE 0.9 % IV SOLN
1000.0000 mg | Freq: Once | INTRAVENOUS | Status: AC
  Administered 2024-03-24: 1000 mg via INTRAVENOUS
  Filled 2024-03-24: qty 10

## 2024-03-24 NOTE — Patient Instructions (Signed)

## 2024-03-27 ENCOUNTER — Ambulatory Visit

## 2024-03-28 ENCOUNTER — Ambulatory Visit

## 2024-03-28 ENCOUNTER — Encounter: Payer: Self-pay | Admitting: *Deleted

## 2024-03-28 NOTE — Progress Notes (Signed)
 Patient had better treatment tolerance with cycle two. Received cycle three.   Oncology Nurse Navigator Documentation     03/28/2024   10:00 AM  Oncology Nurse Navigator Flowsheets  Navigator Follow Up Date: 04/05/2024  Navigator Follow Up Reason: Follow-up Appointment;Chemotherapy  Navigator Location [facility]  Navigator Encounter Type Appt/Treatment Plan Review  Patient Visit Type MedOnc  Treatment Phase Active Tx  Barriers/Navigation Needs Coordination of Care;Education  Interventions None Required  Acuity Level 2-Minimal Needs (1-2 Barriers Identified)  Support Groups/Services Friends and Family  Time Spent with Patient 15

## 2024-03-29 ENCOUNTER — Inpatient Hospital Stay: Admitting: Licensed Clinical Social Worker

## 2024-03-29 ENCOUNTER — Ambulatory Visit

## 2024-03-29 ENCOUNTER — Inpatient Hospital Stay: Admitting: Nurse Practitioner

## 2024-03-29 NOTE — Progress Notes (Signed)
 [facility] CSW Progress Note  Clinical Social Worker contacted patient by phone to follow-up on disability concerns.    Interventions: Patient stated she has turned all of the paperwork into the [facility] and is awaiting decision regarding social security disability.  Patient expressed no other needs at this time.      Follow Up Plan:  CSW will follow-up with patient by phone     [name], LCSW Clinical Social Worker [facility]    Patient is participating in a Managed Medicaid Plan:  Yes

## 2024-03-30 ENCOUNTER — Ambulatory Visit

## 2024-03-31 ENCOUNTER — Encounter: Payer: Self-pay | Admitting: *Deleted

## 2024-03-31 ENCOUNTER — Other Ambulatory Visit: Payer: Self-pay | Admitting: *Deleted

## 2024-03-31 ENCOUNTER — Ambulatory Visit

## 2024-03-31 MED ORDER — OXYCODONE HCL 10 MG PO TABS: 10.0000 mg | ORAL_TABLET | Freq: Four times a day (QID) | ORAL | 0 refills | Status: DC | PRN

## 2024-03-31 NOTE — Progress Notes (Signed)
 Patient calling with complaints of black stool. She states late yesterday to today she's had several soft, black stools.   She also asks for a refill of her ibuprofen . She states she's taking this about every 8h every few days or so, and always takes them with food.  Reviewed with Dr Timmy and he would like her to go to the ED for GI bleed workup. Ibuprofen  NOT to be refilled.   Patient notified. She is hesitant to go to the ED as she doesn't have transportation. She understands the risk of a GI bleed and what that might mean if she doesn't get seen. She also understands that she needs to stop taking the ibuprofen . We reviewed signs and symptoms of worsening condition related to bleeding and I continued to stress the importance of being seen. She understood and said she would try to get to the ED.   Oncology Nurse Navigator Documentation     03/31/2024    3:00 PM  Oncology Nurse Navigator Flowsheets  Navigator Follow Up Date: 04/05/2024  Navigator Follow Up Reason: Follow-up Appointment;Chemotherapy  Financial Risk Analyst Encounter Type Telephone  Telephone Incoming Call;Symptom Mgt  Patient Visit Type MedOnc  Treatment Phase Active Tx  Barriers/Navigation Needs Coordination of Care;Education  Education Pain/ Symptom Management  Interventions Education;Psycho-Social Support  Acuity Level 2-Minimal Needs (1-2 Barriers Identified)  Education Method Verbal;Teach-back  Support Groups/Services Friends and Family  Time Spent with Patient 15

## 2024-04-04 ENCOUNTER — Telehealth: Payer: Self-pay | Admitting: Nurse Practitioner

## 2024-04-04 ENCOUNTER — Encounter (HOSPITAL_COMMUNITY): Payer: Self-pay

## 2024-04-04 ENCOUNTER — Emergency Department (HOSPITAL_COMMUNITY)
Admission: EM | Admit: 2024-04-04 | Discharge: 2024-04-04 | Disposition: A | Discharge status: Home / Self Care | Payer: MEDICAID | Attending: Emergency Medicine | Admitting: Emergency Medicine

## 2024-04-04 ENCOUNTER — Other Ambulatory Visit: Payer: Self-pay

## 2024-04-04 ENCOUNTER — Encounter: Payer: Self-pay | Admitting: Hematology & Oncology

## 2024-04-04 DIAGNOSIS — D72829 Elevated white blood cell count, unspecified: Secondary | ICD-10-CM | POA: Diagnosis not present

## 2024-04-04 DIAGNOSIS — K921 Melena: Secondary | ICD-10-CM | POA: Insufficient documentation

## 2024-04-04 DIAGNOSIS — Z85038 Personal history of other malignant neoplasm of large intestine: Secondary | ICD-10-CM | POA: Diagnosis not present

## 2024-04-04 LAB — COMPREHENSIVE METABOLIC PANEL WITH GFR
ALT: 41 U/L (ref 0–44)
AST: 31 U/L (ref 15–41)
Albumin: 4.2 g/dL (ref 3.5–5.0)
Alkaline Phosphatase: 195 U/L — ABNORMAL HIGH (ref 38–126)
Anion gap: 11 (ref 5–15)
BUN: 6 mg/dL (ref 6–20)
CO2: 26 mmol/L (ref 22–32)
Calcium: 9.4 mg/dL (ref 8.9–10.3)
Chloride: 102 mmol/L (ref 98–111)
Creatinine, Ser: 0.68 mg/dL (ref 0.44–1.00)
GFR, Estimated: >60 mL/min (ref >60)
Glucose, Bld: 105 mg/dL — ABNORMAL HIGH (ref 70–99)
Potassium: 3.5 mmol/L (ref 3.5–5.1)
Sodium: 139 mmol/L (ref 135–145)
Total Bilirubin: <0.2 mg/dL (ref 0.0–1.2)
Total Protein: 6.9 g/dL (ref 6.5–8.1)

## 2024-04-04 LAB — CBC
HCT: 36.1 % (ref 36.0–46.0)
Hemoglobin: 11.2 g/dL — ABNORMAL LOW (ref 12.0–15.0)
MCH: 26.4 pg (ref 26.0–34.0)
MCHC: 31 g/dL (ref 30.0–36.0)
MCV: 85.1 fL (ref 80.0–100.0)
Platelets: 238 K/uL (ref 150–400)
RBC: 4.24 MIL/uL (ref 3.87–5.11)
RDW: 21.7 % — ABNORMAL HIGH (ref 11.5–15.5)
WBC: 15.6 K/uL — ABNORMAL HIGH (ref 4.0–10.5)
nRBC: 0.6 % — ABNORMAL HIGH (ref 0.0–0.2)

## 2024-04-04 LAB — CBG MONITORING, ED: Glucose-Capillary: 111 mg/dL — ABNORMAL HIGH (ref 70–99)

## 2024-04-04 LAB — TYPE AND SCREEN
ABO/RH(D): B POS
Antibody Screen: NEGATIVE

## 2024-04-04 NOTE — Telephone Encounter (Signed)
 I spoke with patient regarding rescheduled palliative care appointment. Patient is aware of date and time.

## 2024-04-04 NOTE — ED Provider Notes (Signed)
 [place] EMERGENCY DEPARTMENT AT [facility] Provider Note   CSN: [id number] Arrival date & time: 04/04/24  0041     Patient presents with: Blood In Stools   Slim Noffsinger is a 33 y.o. female.   33 year old female with history of colon cancer presents with concern for blood from the rectum. Patient noted onset of dark red blood with wiping onset in the middle of last week.  Patient was concerned because the blood was becoming more bright red which prompted her to come to the ER tonight.  She denies shortness of breath, chest pain, palpitations, weakness, fevers, vomiting, abdominal pain.  Patient is not anticoagulated. Chemo every other week, is scheduled for chemo tomorrow.       Prior to Admission medications   Medication Sig Start Date End Date Taking? Authorizing Provider  Acetaminophen  Extra Strength 500 MG CAPS Take 2 tablets by mouth 3 (three) times daily as needed. DO NOT EXCEED 6 PILLS OR 3000 MG IN A 24 HOUR PERIOD. 02/16/24   Maritza Stagger, MD  dexamethasone  (DECADRON ) 4 MG tablet Take 2 tablets (8 mg total) by mouth daily. Take 2 tablets daily x 3 days starting the day after chemotherapy. Take with food. 02/22/24   Timmy Maude SAUNDERS, MD  feeding supplement (ENSURE PLUS HIGH PROTEIN) LIQD Take 237 mLs by mouth 2 (two) times daily between meals. 02/07/24   Jillian Buttery, MD  ibuprofen  (ADVIL ) 800 MG tablet Take 1 tablet (800 mg total) by mouth 3 (three) times daily as needed. 03/22/24   Tonette Lauraine HERO, PA-C  lidocaine -prilocaine  (EMLA ) cream Apply 1 Application topically as needed. 02/23/24   Timmy Maude SAUNDERS, MD  loperamide  (IMODIUM ) 2 MG capsule Take 2 tabs by mouth with first loose stool, then 1 tab with each additional loose stool as needed. Do not exceed 8 tabs in a 24-hour period 02/22/24   Timmy Maude SAUNDERS, MD  ondansetron  (ZOFRAN ) 8 MG tablet Take 1 tablet (8 mg total) by mouth every 8 (eight) hours as needed for nausea or vomiting. Start on the third day  after chemotherapy 02/22/24   Timmy Maude SAUNDERS, MD  oxyCODONE  10 MG TABS Take 1 tablet (10 mg total) by mouth every 6 (six) hours as needed for moderate pain (pain score 4-6). 03/31/24   Timmy Maude SAUNDERS, MD  polyethylene glycol powder (GLYCOLAX /MIRALAX ) 17 GM/SCOOP powder Take 17 g dissolved in liquid by mouth 2 (two) times daily. 02/16/24   Maritza Stagger, MD  prochlorperazine  (COMPAZINE ) 10 MG tablet Take 1 tablet (10 mg total) by mouth every 6 (six) hours as needed for nausea or vomiting (Nausea or vomiting). 02/22/24   Timmy Maude SAUNDERS, MD    Allergies: Morphine and codeine    Review of Systems Negative except as per HPI Updated Vital Signs BP (!) 135/90   Pulse (!) 119   Temp 98 F (36.7 C) (Oral)   Resp 18   SpO2 100%   Physical Exam Vitals and nursing note reviewed.  Constitutional:      General: She is not in acute distress.    Appearance: She is well-developed. She is not diaphoretic.  HENT:     Head: Normocephalic and atraumatic.  Cardiovascular:     Rate and Rhythm: Normal rate and regular rhythm.     Heart sounds: Normal heart sounds.  Pulmonary:     Effort: Pulmonary effort is normal.     Breath sounds: Normal breath sounds.  Abdominal:     Palpations: Abdomen  is soft.     Tenderness: There is no abdominal tenderness.  Musculoskeletal:     Right lower leg: No edema.     Left lower leg: No edema.  Skin:    General: Skin is warm and dry.     Coloration: Skin is not pale.     Findings: No erythema or rash.  Neurological:     Mental Status: She is alert and oriented to person, place, and time.  Psychiatric:        Behavior: Behavior normal.     (all labs ordered are listed, but only abnormal results are displayed) Labs Reviewed  COMPREHENSIVE METABOLIC PANEL WITH GFR - Abnormal; Notable for the following components:      Result Value   Glucose, Bld 105 (*)    Alkaline Phosphatase 195 (*)    All other components within normal limits  CBC - Abnormal;  Notable for the following components:   WBC 15.6 (*)    Hemoglobin 11.2 (*)    RDW 21.7 (*)    nRBC 0.6 (*)    All other components within normal limits  CBG MONITORING, ED - Abnormal; Notable for the following components:   Glucose-Capillary 111 (*)    All other components within normal limits  URINALYSIS, ROUTINE W REFLEX MICROSCOPIC  TYPE AND SCREEN    EKG: None  Radiology: No results found.   Procedures   Medications Ordered in the ED - No data to display                                  Medical Decision Making Amount and/or Complexity of Data Reviewed Labs: ordered.   This patient presents to the ED for concern of blood per rectum, this involves an extensive number of treatment options, and is a complaint that carries with it a high risk of complications and morbidity.  The differential diagnosis includes but not limited to GI bleed, anemia   Co morbidities / Chronic conditions that complicate the patient evaluation  Colon cancer, anemia   Additional history obtained:  Additional history obtained from EMR External records from outside source obtained and reviewed including prior labs and imaging   Lab Tests:  I Ordered, and personally interpreted labs.  The pertinent results include: CBC with mild leukocytosis white count of 15.6, hemoglobin is 11.2, stable compared to prior.  CMP without significant findings, alk phos elevated likely secondary to her cancer.   Cardiac Monitoring: / EKG:  The patient was maintained on a cardiac monitor.  I personally viewed and interpreted the cardiac monitored which showed an underlying rhythm of: sinus tachycardia, rate 113   Problem List / ED Course / Critical interventions / Medication management  33 year old female with history of colon cancer on chemo presents with concern for blood per rectum.  Started in the middle of last week as dark red blood, now reports bright red, occurs with wiping after bowel movement.   Reports stools to be loose, does take Stool softeners due to narcotic pain medication for her pain.  She is not having any symptoms of anemia including shortness of breath, palpitations, weakness.  Labs reveal stable hemoglobin.  Patient has not had a bowel movement while the department to Hemoccult.  Rectal exam deferred as patient is on chemo and stable.  Recommend contact her care team in the morning to discuss her symptoms.  Patient was tachycardic on arrival however her heart  rate has been normal throughout her stay, blood pressure normal. I have reviewed the patients home medicines and have made adjustments as needed   Consultations Obtained:  I requested consultation with the ER attending, Dr. Griselda,  and discussed lab and imaging findings as well as pertinent plan - they recommend: Agrees with plan of care   Social Determinants of Health:  Lives with family, has specialty care team   Test / Admission - Considered:  Stable for discharge      Final diagnoses:  Blood in stool    ED Discharge Orders     None          Beverley Leita DELENA DEVONNA 04/04/24 0255    Griselda Norris, MD 04/04/24 0700

## 2024-04-04 NOTE — ED Triage Notes (Signed)
 Pt is coming in for blood in her stool that has been ongoing since Friday, she mentions that she is a colon cancer patient as well. She is a little more fatigued than usual. She also has some abd pain as well.

## 2024-04-04 NOTE — Discharge Instructions (Addendum)
 Call your oncologist in the morning. Your hemoglobin tonight is stable. Return to the ER for worsening or concerning symptoms.

## 2024-04-05 ENCOUNTER — Encounter: Payer: Self-pay | Admitting: *Deleted

## 2024-04-05 ENCOUNTER — Encounter: Payer: Self-pay | Admitting: Hematology & Oncology

## 2024-04-05 ENCOUNTER — Inpatient Hospital Stay: Payer: Self-pay

## 2024-04-05 ENCOUNTER — Inpatient Hospital Stay: Payer: Self-pay | Attending: Hematology & Oncology

## 2024-04-05 ENCOUNTER — Inpatient Hospital Stay (HOSPITAL_BASED_OUTPATIENT_CLINIC_OR_DEPARTMENT_OTHER): Payer: Self-pay | Admitting: Hematology & Oncology

## 2024-04-05 VITALS — HR 88

## 2024-04-05 VITALS — BP 127/90 | HR 122 | Temp 98.7°F | Resp 18

## 2024-04-05 DIAGNOSIS — Z5112 Encounter for antineoplastic immunotherapy: Secondary | ICD-10-CM | POA: Diagnosis present

## 2024-04-05 DIAGNOSIS — C19 Malignant neoplasm of rectosigmoid junction: Secondary | ICD-10-CM | POA: Insufficient documentation

## 2024-04-05 DIAGNOSIS — Z5111 Encounter for antineoplastic chemotherapy: Secondary | ICD-10-CM | POA: Insufficient documentation

## 2024-04-05 DIAGNOSIS — G893 Neoplasm related pain (acute) (chronic): Secondary | ICD-10-CM

## 2024-04-05 DIAGNOSIS — Z452 Encounter for adjustment and management of vascular access device: Secondary | ICD-10-CM | POA: Insufficient documentation

## 2024-04-05 DIAGNOSIS — D649 Anemia, unspecified: Secondary | ICD-10-CM

## 2024-04-05 DIAGNOSIS — Z5189 Encounter for other specified aftercare: Secondary | ICD-10-CM | POA: Diagnosis not present

## 2024-04-05 LAB — CMP (CANCER CENTER ONLY)
ALT: 38 U/L (ref 0–44)
AST: 32 U/L (ref 15–41)
Albumin: 4.1 g/dL (ref 3.5–5.0)
Alkaline Phosphatase: 149 U/L — ABNORMAL HIGH (ref 38–126)
Anion gap: 12 (ref 5–15)
BUN: 6 mg/dL (ref 6–20)
CO2: 22 mmol/L (ref 22–32)
Calcium: 9.1 mg/dL (ref 8.9–10.3)
Chloride: 107 mmol/L (ref 98–111)
Creatinine: 0.56 mg/dL (ref 0.44–1.00)
GFR, Estimated: >60 mL/min (ref >60)
Glucose, Bld: 100 mg/dL — ABNORMAL HIGH (ref 70–99)
Potassium: 3.6 mmol/L (ref 3.5–5.1)
Sodium: 142 mmol/L (ref 135–145)
Total Bilirubin: <0.2 mg/dL (ref 0.0–1.2)
Total Protein: 6.6 g/dL (ref 6.5–8.1)

## 2024-04-05 LAB — IRON AND IRON BINDING CAPACITY (CC-WL,HP ONLY)
Iron: 150 ug/dL (ref 28–170)
Saturation Ratios: 45 % — ABNORMAL HIGH (ref 10.4–31.8)
TIBC: 335 ug/dL (ref 250–450)
UIBC: 185 ug/dL

## 2024-04-05 LAB — FERRITIN: Ferritin: 1041 ng/mL — ABNORMAL HIGH (ref 11–307)

## 2024-04-05 LAB — CBC WITH DIFFERENTIAL (CANCER CENTER ONLY)
Abs Immature Granulocytes: 0.19 K/uL — ABNORMAL HIGH (ref 0.00–0.07)
Basophils Absolute: 0.1 K/uL (ref 0.0–0.1)
Basophils Relative: 1 %
Eosinophils Absolute: 0 K/uL (ref 0.0–0.5)
Eosinophils Relative: 0 %
HCT: 33.6 % — ABNORMAL LOW (ref 36.0–46.0)
Hemoglobin: 11.1 g/dL — ABNORMAL LOW (ref 12.0–15.0)
Immature Granulocytes: 1 %
Lymphocytes Relative: 18 %
Lymphs Abs: 2.5 K/uL (ref 0.7–4.0)
MCH: 27.8 pg (ref 26.0–34.0)
MCHC: 33 g/dL (ref 30.0–36.0)
MCV: 84.2 fL (ref 80.0–100.0)
Monocytes Absolute: 1.1 K/uL — ABNORMAL HIGH (ref 0.1–1.0)
Monocytes Relative: 8 %
Neutro Abs: 10.2 K/uL — ABNORMAL HIGH (ref 1.7–7.7)
Neutrophils Relative %: 72 %
Platelet Count: 271 K/uL (ref 150–400)
RBC: 3.99 MIL/uL (ref 3.87–5.11)
RDW: 22.1 % — ABNORMAL HIGH (ref 11.5–15.5)
WBC Count: 14.1 K/uL — ABNORMAL HIGH (ref 4.0–10.5)
nRBC: 0.5 % — ABNORMAL HIGH (ref 0.0–0.2)

## 2024-04-05 LAB — CEA (ACCESS): CEA (CHCC): 3.09 ng/mL (ref 0.00–5.00)

## 2024-04-05 LAB — LACTATE DEHYDROGENASE: LDH: 163 U/L (ref 98–192)

## 2024-04-05 LAB — PREGNANCY, URINE: Preg Test, Ur: NEGATIVE

## 2024-04-05 LAB — TOTAL PROTEIN, URINE DIPSTICK: Protein, ur: 30 mg/dL — AB

## 2024-04-05 MED ORDER — PALONOSETRON HCL INJECTION 0.25 MG/5ML
0.2500 mg | Freq: Once | INTRAVENOUS | Status: AC
  Administered 2024-04-05: 0.25 mg via INTRAVENOUS
  Filled 2024-04-05: qty 5

## 2024-04-05 MED ORDER — SODIUM CHLORIDE 0.9 % IV SOLN
400.0000 mg/m2 | Freq: Once | INTRAVENOUS | Status: AC
  Administered 2024-04-05: 584 mg via INTRAVENOUS
  Filled 2024-04-05: qty 29.2

## 2024-04-05 MED ORDER — SODIUM CHLORIDE 0.9 % IV SOLN
2000.0000 mg/m2 | INTRAVENOUS | Status: DC
  Administered 2024-04-05: 2900 mg via INTRAVENOUS
  Filled 2024-04-05: qty 58

## 2024-04-05 MED ORDER — SODIUM CHLORIDE 0.9 % IV SOLN
150.0000 mg | Freq: Once | INTRAVENOUS | Status: AC
  Administered 2024-04-05: 150 mg via INTRAVENOUS
  Filled 2024-04-05: qty 150

## 2024-04-05 MED ORDER — ATROPINE SULFATE 1 MG/ML IV SOLN
0.5000 mg | Freq: Once | INTRAVENOUS | Status: AC | PRN
  Administered 2024-04-05: 0.5 mg via INTRAVENOUS
  Filled 2024-04-05: qty 1

## 2024-04-05 MED ORDER — SODIUM CHLORIDE 0.9 % IV SOLN: INTRAVENOUS | Status: DC

## 2024-04-05 MED ORDER — OXALIPLATIN CHEMO INJECTION 100 MG/20ML
85.0000 mg/m2 | Freq: Once | INTRAVENOUS | Status: AC
  Administered 2024-04-05: 125 mg via INTRAVENOUS
  Filled 2024-04-05: qty 20

## 2024-04-05 MED ORDER — SODIUM CHLORIDE 0.9 % IV SOLN
5.0000 mg/kg | Freq: Once | INTRAVENOUS | Status: AC
  Administered 2024-04-05: 225 mg via INTRAVENOUS
  Filled 2024-04-05: qty 9

## 2024-04-05 MED ORDER — SODIUM CHLORIDE 0.9 % IV SOLN
180.0000 mg/m2 | Freq: Once | INTRAVENOUS | Status: AC
  Administered 2024-04-05: 260 mg via INTRAVENOUS
  Filled 2024-04-05: qty 5

## 2024-04-05 MED ORDER — DEXAMETHASONE SOD PHOSPHATE PF 10 MG/ML IJ SOLN
10.0000 mg | Freq: Once | INTRAMUSCULAR | Status: AC
  Administered 2024-04-05: 10 mg via INTRAVENOUS

## 2024-04-05 MED ORDER — DEXTROSE 5 % IV SOLN: INTRAVENOUS | Status: DC

## 2024-04-05 MED ORDER — FLUOROURACIL CHEMO INJECTION 2.5 GM/50ML
400.0000 mg/m2 | Freq: Once | INTRAVENOUS | Status: AC
  Administered 2024-04-05: 600 mg via INTRAVENOUS
  Filled 2024-04-05: qty 12

## 2024-04-05 NOTE — Patient Instructions (Signed)
 [facility] - A DEPT OF [facility]  Discharge Instructions: Thank you for choosing [facility] to provide your oncology and hematology care.   If you have a lab appointment with the Cancer Center, please go directly to the Cancer Center and check in at the registration area.  Wear comfortable clothing and clothing appropriate for easy access to any Portacath or PICC line.   We strive to give you quality time with your provider. You may need to reschedule your appointment if you arrive late (15 or more minutes).  Arriving late affects you and other patients whose appointments are after yours.  Also, if you miss three or more appointments without notifying the office, you may be dismissed from the clinic at the provider's discretion.      For prescription refill requests, have your pharmacy contact our office and allow 72 hours for refills to be completed.    Today you received the following chemotherapy and/or immunotherapy agents:  Oxaliplatin , Leucovorin , Irinotecan , Avastin and 5FU.       To help prevent nausea and vomiting after your treatment, we encourage you to take your nausea medication as directed.  BELOW ARE SYMPTOMS THAT SHOULD BE REPORTED IMMEDIATELY: *FEVER GREATER THAN 100.4 F (38 C) OR HIGHER *CHILLS OR SWEATING *NAUSEA AND VOMITING THAT IS NOT CONTROLLED WITH YOUR NAUSEA MEDICATION *UNUSUAL SHORTNESS OF BREATH *UNUSUAL BRUISING OR BLEEDING *URINARY PROBLEMS (pain or burning when urinating, or frequent urination) *BOWEL PROBLEMS (unusual diarrhea, constipation, pain near the anus) TENDERNESS IN MOUTH AND THROAT WITH OR WITHOUT PRESENCE OF ULCERS (sore throat, sores in mouth, or a toothache) UNUSUAL RASH, SWELLING OR PAIN  UNUSUAL VAGINAL DISCHARGE OR ITCHING   Items with * indicate a potential emergency and should be followed up as soon as possible or go to the Emergency Department if any problems should occur.  Please show  the CHEMOTHERAPY ALERT CARD or IMMUNOTHERAPY ALERT CARD at check-in to the Emergency Department and triage nurse. Should you have questions after your visit or need to cancel or reschedule your appointment, please contact [facility] - A DEPT OF [facility]  [phone] and follow the prompts.  Office hours are 8:00 a.m. to 4:30 p.m. Monday - Friday. Please note that voicemails left after 4:00 p.m. may not be returned until the following business day.  We are closed weekends and major holidays. You have access to a nurse at all times for urgent questions. Please call the main number to the clinic [phone] and follow the prompts.  For any non-urgent questions, you may also contact your provider using MyChart. We now offer e-Visits for anyone 3 and older to request care online for non-urgent symptoms. For details visit [email or web address].   Also download the MyChart app! Go to the app store, search MyChart, open the app, select [facility], and log in with your MyChart username and password.

## 2024-04-05 NOTE — Patient Instructions (Signed)

## 2024-04-05 NOTE — Progress Notes (Signed)
 Hematology and Oncology Follow Up Visit  Amy Scott [id number] 04/28/1991 33 y.o. 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  She actually looks quite good.  She has had no problems with nausea or vomiting.  She has had a little bit of rectal bleeding.  I really not surprised by this at this is where the tumor is for the most part.  Of note, her CEA has been coming down nicely.  Her last CEA was 4.3.  She has had no problems with mouth sores.  There is been no dysphagia or odynophagia.  She has had no leg swelling.  There has been no issues with fever.  She has had no headache.  Overall, I would have to say that her performance status is ECOG 1.    Wt Readings from Last 3 Encounters:  03/22/24 104 lb (47.2 kg)  03/08/24 99 lb 9.6 oz (45.2 kg)  02/23/24 103 lb (46.7 kg)     Medications:  Current Outpatient Medications:    Acetaminophen  Extra Strength 500 MG CAPS, Take 2 tablets by mouth 3 (three) times daily as needed. DO NOT EXCEED 6 PILLS OR 3000 MG IN A 24 HOUR PERIOD., Disp: 60 capsule, Rfl: 1   dexamethasone  (DECADRON ) 4 MG tablet, Take 2 tablets (8 mg total) by mouth daily. Take 2 tablets daily x 3 days starting the day after chemotherapy. Take with food., Disp: 30 tablet, Rfl: 1   feeding supplement (ENSURE PLUS HIGH PROTEIN) LIQD, Take 237 mLs by mouth 2 (two) times daily between meals., Disp: 5000 mL, Rfl: 0   ibuprofen  (ADVIL ) 800 MG tablet, Take 1 tablet (800 mg total) by mouth 3 (three) times daily as needed., Disp: 30 tablet, Rfl: 0   lidocaine -prilocaine  (EMLA ) cream, Apply 1 Application topically as needed., Disp: 30 g, Rfl: 0   loperamide  (IMODIUM ) 2 MG capsule, Take 2 tabs by mouth with first loose stool, then 1 tab with each additional loose stool  as needed. Do not exceed 8 tabs in a 24-hour period, Disp: 60 capsule, Rfl: 3   ondansetron  (ZOFRAN ) 8 MG tablet, Take 1 tablet (8 mg total) by mouth every 8 (eight) hours as needed for nausea or vomiting. Start on the third day after chemotherapy, Disp: 30 tablet, Rfl: 1   oxyCODONE  10 MG TABS, Take 1 tablet (10 mg total) by mouth every 6 (six) hours as needed for moderate pain (pain score 4-6)., Disp: 120 tablet, Rfl: 0   polyethylene glycol powder (GLYCOLAX /MIRALAX ) 17 GM/SCOOP powder, Take 17 g dissolved in liquid by mouth 2 (two) times daily., Disp: 238 g, Rfl: 0   prochlorperazine  (COMPAZINE ) 10 MG tablet, Take 1 tablet (10 mg total) by mouth every 6 (six) hours as needed for nausea or vomiting (Nausea or vomiting)., Disp: 30 tablet, Rfl: 1  Allergies:  Allergies  Allergen Reactions   Morphine And Codeine Hives, Shortness Of Breath, Itching and Swelling    Past Medical History, Surgical history, Social history, and Family History were reviewed and updated.  Review of Systems: Review of Systems  Constitutional: Negative.   HENT:  Negative.    Eyes: Negative.   Respiratory: Negative.    Cardiovascular: Negative.   Gastrointestinal: Negative.   Endocrine: Negative.   Genitourinary: Negative.    Musculoskeletal: Negative.   Skin:  Negative.   Neurological: Negative.   Hematological: Negative.   Psychiatric/Behavioral: Negative.      Physical Exam:  oral temperature is 98.7 F (37.1 C). Her blood pressure is 127/90 (abnormal) and her pulse is 122 (abnormal). Her respiration is 18 and oxygen saturation is 99%.   Wt Readings from Last 3 Encounters:  03/22/24 104 lb (47.2 kg)  03/08/24 99 lb 9.6 oz (45.2 kg)  02/23/24 103 lb (46.7 kg)    Physical Exam Vitals reviewed.  HENT:     Head: Normocephalic and atraumatic.  Eyes:     Pupils: Pupils are equal, round, and reactive to light.  Cardiovascular:     Rate and Rhythm: Normal rate and regular rhythm.     Heart sounds:  Normal heart sounds.  Pulmonary:     Effort: Pulmonary effort is normal.     Breath sounds: Normal breath sounds.  Abdominal:     General: Bowel sounds are normal.     Palpations: Abdomen is soft.  Musculoskeletal:        General: No tenderness or deformity. Normal range of motion.     Cervical back: Normal range of motion.  Lymphadenopathy:     Cervical: No cervical adenopathy.  Skin:    General: Skin is warm and dry.     Findings: No erythema or rash.  Neurological:     Mental Status: She is alert and oriented to person, place, and time.  Psychiatric:        Behavior: Behavior normal.        Thought Content: Thought content normal.        Judgment: Judgment normal.      Lab Results  Component Value Date   WBC 14.1 (H) 04/05/2024   HGB 11.1 (L) 04/05/2024   HCT 33.6 (L) 04/05/2024   MCV 84.2 04/05/2024   PLT 271 04/05/2024     Chemistry      Component Value Date/Time   NA 139 04/04/2024 0109   K 3.5 04/04/2024 0109   CL 102 04/04/2024 0109   CO2 26 04/04/2024 0109   BUN 6 04/04/2024 0109   CREATININE 0.68 04/04/2024 0109   CREATININE 0.57 03/22/2024 0930      Component Value Date/Time   CALCIUM  9.4 04/04/2024 0109   ALKPHOS 195 (H) 04/04/2024 0109   AST 31 04/04/2024 0109   AST 17 03/22/2024 0930   ALT 41 04/04/2024 0109   ALT 16 03/22/2024 0930   BILITOT <0.2 04/04/2024 0109   BILITOT <0.2 03/22/2024 0930     Encounter Diagnosis  Name Primary?   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  She has locally advanced rectal cancer.She is being treated neoadjuvantly to shrink things down so that she will be able to undergo surgery.    Again, our goal here is surgical resection.   We will go ahead with her fourth cycle of treatment.  Again, we will plan for 6 cycles of treatment and then repeat the images.  I am just happy that she seems to be  tolerating treatment quite nicely right now.    [name], MD 11/5/20258:33 AM

## 2024-04-05 NOTE — Progress Notes (Signed)
 Patient with good treatment tolerance. She did go to the ED in regards to her black stools, but was found to be stable and discharged home. Her labs are stable today. She will proceed with cycle four.   Oncology Nurse Navigator Documentation     04/05/2024    8:00 AM  Oncology Nurse Navigator Flowsheets  Navigator Follow Up Date: 04/19/2024  Navigator Follow Up Reason: Follow-up Appointment;Chemotherapy  Navigator Radiographer, Therapeutic Encounter Type Treatment  Patient Visit Type MedOnc  Treatment Phase Active Tx  Barriers/Navigation Needs Coordination of Care;Education  Interventions Psycho-Social Support  Acuity Level 2-Minimal Needs (1-2 Barriers Identified)  Support Groups/Services Friends and Family  Time Spent with Patient 15

## 2024-04-07 ENCOUNTER — Inpatient Hospital Stay: Payer: Self-pay

## 2024-04-07 VITALS — BP 131/88 | HR 74 | Temp 98.6°F

## 2024-04-07 DIAGNOSIS — Z5112 Encounter for antineoplastic immunotherapy: Secondary | ICD-10-CM | POA: Diagnosis not present

## 2024-04-07 DIAGNOSIS — C19 Malignant neoplasm of rectosigmoid junction: Secondary | ICD-10-CM

## 2024-04-07 MED ORDER — PEGFILGRASTIM-CBQV 6 MG/0.6ML ~~LOC~~ SOSY
6.0000 mg | PREFILLED_SYRINGE | Freq: Once | SUBCUTANEOUS | Status: AC
  Administered 2024-04-07: 6 mg via SUBCUTANEOUS
  Filled 2024-04-07: qty 0.6

## 2024-04-07 NOTE — Patient Instructions (Signed)

## 2024-04-18 ENCOUNTER — Emergency Department (HOSPITAL_BASED_OUTPATIENT_CLINIC_OR_DEPARTMENT_OTHER): Payer: Self-pay

## 2024-04-18 ENCOUNTER — Other Ambulatory Visit: Payer: Self-pay

## 2024-04-18 ENCOUNTER — Encounter (HOSPITAL_BASED_OUTPATIENT_CLINIC_OR_DEPARTMENT_OTHER): Payer: Self-pay | Admitting: Emergency Medicine

## 2024-04-18 ENCOUNTER — Emergency Department (HOSPITAL_BASED_OUTPATIENT_CLINIC_OR_DEPARTMENT_OTHER)
Admission: EM | Admit: 2024-04-18 | Discharge: 2024-04-18 | Disposition: A | Discharge status: Home / Self Care | Payer: Self-pay | Attending: Emergency Medicine | Admitting: Emergency Medicine

## 2024-04-18 DIAGNOSIS — C20 Malignant neoplasm of rectum: Secondary | ICD-10-CM

## 2024-04-18 DIAGNOSIS — K625 Hemorrhage of anus and rectum: Secondary | ICD-10-CM | POA: Insufficient documentation

## 2024-04-18 DIAGNOSIS — K529 Noninfective gastroenteritis and colitis, unspecified: Secondary | ICD-10-CM | POA: Insufficient documentation

## 2024-04-18 DIAGNOSIS — C189 Malignant neoplasm of colon, unspecified: Secondary | ICD-10-CM | POA: Insufficient documentation

## 2024-04-18 LAB — CBC WITH DIFFERENTIAL/PLATELET
Abs Immature Granulocytes: 0.15 K/uL — ABNORMAL HIGH (ref 0.00–0.07)
Basophils Absolute: 0 K/uL (ref 0.0–0.1)
Basophils Relative: 0 %
Eosinophils Absolute: 0 K/uL (ref 0.0–0.5)
Eosinophils Relative: 0 %
HCT: 38.3 % (ref 36.0–46.0)
Hemoglobin: 13.1 g/dL (ref 12.0–15.0)
Immature Granulocytes: 1 %
Lymphocytes Relative: 17 %
Lymphs Abs: 2.1 K/uL (ref 0.7–4.0)
MCH: 28.5 pg (ref 26.0–34.0)
MCHC: 34.2 g/dL (ref 30.0–36.0)
MCV: 83.3 fL (ref 80.0–100.0)
Monocytes Absolute: 1 K/uL (ref 0.1–1.0)
Monocytes Relative: 8 %
Neutro Abs: 8.9 K/uL — ABNORMAL HIGH (ref 1.7–7.7)
Neutrophils Relative %: 74 %
Platelets: 266 K/uL (ref 150–400)
RBC: 4.6 MIL/uL (ref 3.87–5.11)
RDW: 23.3 % — ABNORMAL HIGH (ref 11.5–15.5)
Smear Review: NORMAL
WBC: 12.3 K/uL — ABNORMAL HIGH (ref 4.0–10.5)
nRBC: 0.2 % (ref 0.0–0.2)

## 2024-04-18 LAB — COMPREHENSIVE METABOLIC PANEL WITH GFR
ALT: 37 U/L (ref 0–44)
AST: 25 U/L (ref 15–41)
Albumin: 4.1 g/dL (ref 3.5–5.0)
Alkaline Phosphatase: 158 U/L — ABNORMAL HIGH (ref 38–126)
Anion gap: 17 — ABNORMAL HIGH (ref 5–15)
BUN: 10 mg/dL (ref 6–20)
CO2: 21 mmol/L — ABNORMAL LOW (ref 22–32)
Calcium: 9.6 mg/dL (ref 8.9–10.3)
Chloride: 100 mmol/L (ref 98–111)
Creatinine, Ser: 0.77 mg/dL (ref 0.44–1.00)
GFR, Estimated: >60 mL/min (ref >60)
Glucose, Bld: 95 mg/dL (ref 70–99)
Potassium: 3.8 mmol/L (ref 3.5–5.1)
Sodium: 137 mmol/L (ref 135–145)
Total Bilirubin: <0.2 mg/dL (ref 0.0–1.2)
Total Protein: 7 g/dL (ref 6.5–8.1)

## 2024-04-18 LAB — OCCULT BLOOD X 1 CARD TO LAB, STOOL: Fecal Occult Bld: POSITIVE — AB

## 2024-04-18 LAB — HCG, SERUM, QUALITATIVE: Preg, Serum: NEGATIVE

## 2024-04-18 MED ORDER — AMOXICILLIN-POT CLAVULANATE 875-125 MG PO TABS: 1.0000 | ORAL_TABLET | Freq: Two times a day (BID) | ORAL | 0 refills | Status: AC

## 2024-04-18 MED ORDER — PROMETHAZINE HCL 25 MG PO TABS: 25.0000 mg | ORAL_TABLET | Freq: Four times a day (QID) | ORAL | 0 refills | Status: AC | PRN

## 2024-04-18 MED ORDER — LACTATED RINGERS IV BOLUS
1000.0000 mL | Freq: Once | INTRAVENOUS | Status: AC
  Administered 2024-04-18: 1000 mL via INTRAVENOUS

## 2024-04-18 MED ORDER — ONDANSETRON HCL 4 MG/2ML IJ SOLN
4.0000 mg | Freq: Once | INTRAMUSCULAR | Status: AC
  Administered 2024-04-18: 4 mg via INTRAVENOUS
  Filled 2024-04-18: qty 2

## 2024-04-18 MED ORDER — AMOXICILLIN-POT CLAVULANATE 875-125 MG PO TABS
1.0000 | ORAL_TABLET | Freq: Once | ORAL | Status: AC
  Administered 2024-04-18: 1 via ORAL
  Filled 2024-04-18: qty 1

## 2024-04-18 MED ORDER — HYDROMORPHONE HCL 1 MG/ML IJ SOLN
1.0000 mg | Freq: Once | INTRAMUSCULAR | Status: AC
  Administered 2024-04-18: 1 mg via INTRAVENOUS
  Filled 2024-04-18: qty 1

## 2024-04-18 MED ORDER — IOHEXOL 300 MG/ML  SOLN
100.0000 mL | Freq: Once | INTRAMUSCULAR | Status: AC | PRN
  Administered 2024-04-18: 100 mL via INTRAVENOUS

## 2024-04-18 NOTE — ED Provider Notes (Signed)
 [place] [facility] Provider Note   CSN: [id number] Arrival date & time: 04/18/24  9248     Patient presents with: Rectal Bleeding   Amy Scott is a 33 y.o. female.   33 year old female with a history of rectal cancer on chemotherapy presents to the emergency department rectal bleeding, rectal pain, abdominal pain, nausea and vomiting.  Symptoms have been ongoing since 11/5 when she got her chemotherapy.  Has had been having small amounts of blood in the toilet since then.  Says that recently she started having more severe lower abdominal and rectal pain that is worsened by having bowel movements.  She is on oxycodone  10 at home and he has been taking this as well as Zofran  for nausea and vomiting that she has been having.  Says that her pain is currently 9/10 in severity.  Decreased flatus over the past day       Prior to Admission medications   Medication Sig Start Date End Date Taking? Authorizing Provider  amoxicillin-clavulanate (AUGMENTIN) 875-125 MG tablet Take 1 tablet by mouth every 12 (twelve) hours. 04/18/24  Yes Yolande Lamar BROCKS, MD  promethazine  (PHENERGAN ) 25 MG tablet Take 1 tablet (25 mg total) by mouth every 6 (six) hours as needed for nausea or vomiting. 04/18/24  Yes Yolande Lamar BROCKS, MD  Acetaminophen  Extra Strength 500 MG CAPS Take 2 tablets by mouth 3 (three) times daily as needed. DO NOT EXCEED 6 PILLS OR 3000 MG IN A 24 HOUR PERIOD. 02/16/24   Maritza Stagger, MD  dexamethasone  (DECADRON ) 4 MG tablet Take 2 tablets (8 mg total) by mouth daily. Take 2 tablets daily x 3 days starting the day after chemotherapy. Take with food. 02/22/24   Timmy Maude SAUNDERS, MD  feeding supplement (ENSURE PLUS HIGH PROTEIN) LIQD Take 237 mLs by mouth 2 (two) times daily between meals. 02/07/24   Jillian Buttery, MD  ibuprofen  (ADVIL ) 800 MG tablet Take 1 tablet (800 mg total) by mouth 3 (three) times daily as needed. 03/22/24   Tonette Lauraine HERO,  PA-C  lidocaine -prilocaine  (EMLA ) cream Apply 1 Application topically as needed. 02/23/24   Timmy Maude SAUNDERS, MD  loperamide  (IMODIUM ) 2 MG capsule Take 2 tabs by mouth with first loose stool, then 1 tab with each additional loose stool as needed. Do not exceed 8 tabs in a 24-hour period 02/22/24   Timmy Maude SAUNDERS, MD  ondansetron  (ZOFRAN ) 8 MG tablet Take 1 tablet (8 mg total) by mouth every 8 (eight) hours as needed for nausea or vomiting. Start on the third day after chemotherapy 02/22/24   Timmy Maude SAUNDERS, MD  oxyCODONE  10 MG TABS Take 1 tablet (10 mg total) by mouth every 6 (six) hours as needed for moderate pain (pain score 4-6). 03/31/24   Timmy Maude SAUNDERS, MD  polyethylene glycol powder (GLYCOLAX /MIRALAX ) 17 GM/SCOOP powder Take 17 g dissolved in liquid by mouth 2 (two) times daily. 02/16/24   Maritza Stagger, MD  prochlorperazine  (COMPAZINE ) 10 MG tablet Take 1 tablet (10 mg total) by mouth every 6 (six) hours as needed for nausea or vomiting (Nausea or vomiting). 02/22/24   Timmy Maude SAUNDERS, MD    Allergies: Morphine and codeine    Review of Systems  Updated Vital Signs BP 119/83   Pulse 78   Temp 97.8 F (36.6 C)   Resp 16   Wt 48.1 kg   SpO2 100%   BMI 18.19 kg/m   Physical Exam Constitutional:  Appearance: Normal appearance.  Abdominal:     General: There is no distension.     Palpations: There is no mass.     Tenderness: There is abdominal tenderness (Suprapubic and right lower quadrant). There is no guarding.  Neurological:     Mental Status: She is alert.     (all labs ordered are listed, but only abnormal results are displayed) Labs Reviewed  CBC WITH DIFFERENTIAL/PLATELET - Abnormal; Notable for the following components:      Result Value   WBC 12.3 (*)    RDW 23.3 (*)    Neutro Abs 8.9 (*)    Abs Immature Granulocytes 0.15 (*)    All other components within normal limits  COMPREHENSIVE METABOLIC PANEL WITH GFR - Abnormal; Notable for the following  components:   CO2 21 (*)    Alkaline Phosphatase 158 (*)    Anion gap 17 (*)    All other components within normal limits  OCCULT BLOOD X 1 CARD TO LAB, STOOL - Abnormal; Notable for the following components:   Fecal Occult Bld POSITIVE (*)    All other components within normal limits  HCG, SERUM, QUALITATIVE    EKG: None  Radiology: CT ABDOMEN PELVIS W CONTRAST Result Date: 04/18/2024 CLINICAL DATA:  Right lower quadrant pain. Bright red blood per rectum. Nausea. Colorectal cancer diagnosed 02/03/2024. EXAM: CT ABDOMEN AND PELVIS WITH CONTRAST TECHNIQUE: Multidetector CT imaging of the abdomen and pelvis was performed using the standard protocol following bolus administration of intravenous contrast. RADIATION DOSE REDUCTION: This exam was performed according to the departmental dose-optimization program which includes automated exposure control, adjustment of the mA and/or kV according to patient size and/or use of iterative reconstruction technique. CONTRAST:  OMNIPAQUE  IOHEXOL  300 MG/ML  SOLN COMPARISON:  02/04/2024 FINDINGS: Lower chest: Heart is normal size.  Visualized lung bases are clear. Hepatobiliary: Few tiny liver hypodensities with the largest measuring 1 cm hypodensity over the right lobe too small to characterize, but likely a cysts. Remainder of the liver is unremarkable. Gallbladder and biliary tree are normal. Pancreas: Normal. Spleen: Normal. Adrenals/Urinary Tract: Adrenal glands are normal. Kidneys are normal size without hydronephrosis or nephrolithiasis. No focal renal mass. Ureters and bladder are normal. Stomach/Bowel: Small bowel is unremarkable.  Previous appendectomy. Heterogeneous soft tissue density adjacent the rectum compatible patient's recently diagnosed colorectal cancer. Few tiny perirectal lymph nodes. There is mild wall thickening of the sigmoid colon just proximal to this mass which may be due to an acute colitis. Remainder the colon is unremarkable.  Vascular/Lymphatic: Abdominal aorta is normal caliber. Remaining vascular structures are unremarkable. Few tiny perirectal lymph nodes, otherwise no significant adenopathy. Reproductive: Uterus and bilateral adnexa are unremarkable. Other: No free fluid or focal inflammatory change. Musculoskeletal: No focal abnormality. IMPRESSION: 1. Heterogeneous soft tissue density adjacent the rectum compatible with patient's recently diagnosed colorectal cancer without significant change. Few tiny perirectal lymph nodes. 2. Mild wall thickening of the sigmoid colon just proximal to this mass which may be due to an acute colitis. 3. Few tiny liver hypodensities with the largest measuring 1 cm over the right lobe too small to characterize, but likely a cysts. Electronically Signed   By: Toribio Agreste M.D.   On: 04/18/2024 10:15     Procedures   Medications Ordered in the ED  ondansetron  (ZOFRAN ) injection 4 mg (4 mg Intravenous Given 04/18/24 0913)  lactated ringers  bolus 1,000 mL (0 mLs Intravenous Stopped 04/18/24 1129)  HYDROmorphone  (DILAUDID ) injection 1 mg (1  mg Intravenous Given 04/18/24 0910)  iohexol  (OMNIPAQUE ) 300 MG/ML solution 100 mL (100 mLs Intravenous Contrast Given 04/18/24 0926)  amoxicillin-clavulanate (AUGMENTIN) 875-125 MG per tablet 1 tablet (1 tablet Oral Given 04/18/24 1043)                                    Medical Decision Making Amount and/or Complexity of Data Reviewed Labs: ordered. Radiology: ordered.  Risk Prescription drug management.   Amy Scott is a 33 year old female history of rectal cancer on chemotherapy presents emergency department rectal bleeding, rectal pain, abdominal pain, nausea and vomiting  Initial Ddx:  Progressive malignancy, anemia, GI bleed, colitis, typhlitis  MDM/Course:  Patient presents to the emergency department with lower abdominal pain and rectal pain as well as blood in her stool.  Also has been having some nausea and vomiting.   On exam has reassuring vitals.  Does have some suprapubic and right lower quadrant tenderness to palpation.  Lab work showed leukocytosis of 12.3.  She is not currently neutropenic.  Had a mild anion gap metabolic acidosis.  Was given IV fluids.  She had a CT scan that showed no significant progression of her disease and some mild proximal colitis.  She was given Augmentin for this.  Did send some stool which is Hemoccult positive.  Rectal exam significant for her mass but no significant amount of bleeding on rectal exam.  Upon re-evaluation she is tolerating p.o. and is overall well-appearing.  Since she is not on blood thinners and is not anemic at this point in time with reassuring vitals do not feel that she needs to be admitted for GI bleed.  Will have her follow-up with her PCP and oncology  This patient presents to the ED for concern of complaints listed in HPI, this involves an extensive number of treatment options, and is a complaint that carries with it a high risk of complications and morbidity. Disposition including potential need for admission considered.   Dispo: DC Home. Return precautions discussed including, but not limited to, those listed in the AVS. Allowed pt time to ask questions which were answered fully prior to dc.  Records reviewed Outpatient Clinic Notes The following labs were independently interpreted: Chemistry and show anion gap metabolic acidosis I independently reviewed the following imaging with scope of interpretation limited to determining acute life threatening conditions related to emergency care: CT Abdomen/Pelvis and agree with the radiologist interpretation with the following exceptions: none I personally reviewed and interpreted cardiac monitoring: normal sinus rhythm  I personally reviewed and interpreted the pt's EKG: see above for interpretation  I have reviewed the patients home medications and made adjustments as needed  Portions of this note were  generated with Dragon dictation software. Dictation errors may occur despite best attempts at proofreading.     Final diagnoses:  Rectal bleeding  Rectal cancer [facility])  Colitis    ED Discharge Orders          Ordered    amoxicillin-clavulanate (AUGMENTIN) 875-125 MG tablet  Every 12 hours        04/18/24 1030    promethazine  (PHENERGAN ) 25 MG tablet  Every 6 hours PRN        04/18/24 1030               Yolande Lamar BROCKS, MD 04/18/24 1200

## 2024-04-18 NOTE — Discharge Instructions (Signed)
 You were seen for your rectal pain and bleeding in the emergency department.  You were found to have possible colitis  At home, please take the Augmentin for your colitis.  The Phenergan  for your nausea and vomiting.  Follow-up with your primary doctor and oncologist.    Check your MyChart online for the results of any tests that had not resulted by the time you left the emergency department.   Follow-up with your primary doctor in 2-3 days regarding your visit.    Return immediately to the emergency department if you experience any of the following: Worsening pain, fevers, or any other concerning symptoms.    Thank you for visiting our Emergency Department. It was a pleasure taking care of you today.

## 2024-04-18 NOTE — ED Triage Notes (Signed)
 Persistent rectal bleed , bright red blood with stools . Hx colon cancer . Rectal pain and lower abd pain . Nausea .

## 2024-04-19 ENCOUNTER — Encounter: Payer: Self-pay | Admitting: Hematology & Oncology

## 2024-04-19 ENCOUNTER — Inpatient Hospital Stay: Payer: Self-pay

## 2024-04-19 ENCOUNTER — Inpatient Hospital Stay: Payer: Self-pay | Admitting: Licensed Clinical Social Worker

## 2024-04-19 ENCOUNTER — Inpatient Hospital Stay: Payer: Self-pay | Admitting: Hematology & Oncology

## 2024-04-19 ENCOUNTER — Inpatient Hospital Stay: Payer: Self-pay | Admitting: Dietician

## 2024-04-19 ENCOUNTER — Encounter: Payer: Self-pay | Admitting: *Deleted

## 2024-04-19 VITALS — BP 105/82 | HR 98 | Temp 98.6°F | Resp 18 | Ht 64.0 in | Wt 95.0 lb

## 2024-04-19 DIAGNOSIS — C19 Malignant neoplasm of rectosigmoid junction: Secondary | ICD-10-CM

## 2024-04-19 DIAGNOSIS — Z5112 Encounter for antineoplastic immunotherapy: Secondary | ICD-10-CM | POA: Diagnosis not present

## 2024-04-19 LAB — CBC WITH DIFFERENTIAL (CANCER CENTER ONLY)
Abs Immature Granulocytes: 0.33 K/uL — ABNORMAL HIGH (ref 0.00–0.07)
Basophils Absolute: 0.1 K/uL (ref 0.0–0.1)
Basophils Relative: 1 %
Eosinophils Absolute: 0.1 K/uL (ref 0.0–0.5)
Eosinophils Relative: 0 %
HCT: 37 % (ref 36.0–46.0)
Hemoglobin: 12.4 g/dL (ref 12.0–15.0)
Immature Granulocytes: 1 %
Lymphocytes Relative: 13 %
Lymphs Abs: 3.2 K/uL (ref 0.7–4.0)
MCH: 27.9 pg (ref 26.0–34.0)
MCHC: 33.5 g/dL (ref 30.0–36.0)
MCV: 83.1 fL (ref 80.0–100.0)
Monocytes Absolute: 1.7 K/uL — ABNORMAL HIGH (ref 0.1–1.0)
Monocytes Relative: 7 %
Neutro Abs: 19.7 K/uL — ABNORMAL HIGH (ref 1.7–7.7)
Neutrophils Relative %: 78 %
Platelet Count: 282 K/uL (ref 150–400)
RBC: 4.45 MIL/uL (ref 3.87–5.11)
RDW: 22.6 % — ABNORMAL HIGH (ref 11.5–15.5)
WBC Count: 25.1 K/uL — ABNORMAL HIGH (ref 4.0–10.5)
nRBC: 0 % (ref 0.0–0.2)

## 2024-04-19 LAB — CMP (CANCER CENTER ONLY)
ALT: 32 U/L (ref 0–44)
AST: 25 U/L (ref 15–41)
Albumin: 4.1 g/dL (ref 3.5–5.0)
Alkaline Phosphatase: 253 U/L — ABNORMAL HIGH (ref 38–126)
Anion gap: 14 (ref 5–15)
BUN: 6 mg/dL (ref 6–20)
CO2: 21 mmol/L — ABNORMAL LOW (ref 22–32)
Calcium: 9.2 mg/dL (ref 8.9–10.3)
Chloride: 102 mmol/L (ref 98–111)
Creatinine: 0.75 mg/dL (ref 0.44–1.00)
GFR, Estimated: >60 mL/min (ref >60)
Glucose, Bld: 103 mg/dL — ABNORMAL HIGH (ref 70–99)
Potassium: 3.5 mmol/L (ref 3.5–5.1)
Sodium: 137 mmol/L (ref 135–145)
Total Bilirubin: <0.2 mg/dL (ref 0.0–1.2)
Total Protein: 6.8 g/dL (ref 6.5–8.1)

## 2024-04-19 LAB — IRON AND IRON BINDING CAPACITY (CC-WL,HP ONLY)
Iron: 128 ug/dL (ref 28–170)
Saturation Ratios: 39 % — ABNORMAL HIGH (ref 10.4–31.8)
TIBC: 326 ug/dL (ref 250–450)
UIBC: 198 ug/dL

## 2024-04-19 LAB — FERRITIN: Ferritin: 1002 ng/mL — ABNORMAL HIGH (ref 11–307)

## 2024-04-19 LAB — PREGNANCY, URINE: Preg Test, Ur: NEGATIVE

## 2024-04-19 LAB — CEA (ACCESS): CEA (CHCC): 2.48 ng/mL (ref 0.00–5.00)

## 2024-04-19 NOTE — Patient Instructions (Signed)

## 2024-04-19 NOTE — Progress Notes (Signed)
 Hematology and Oncology Follow Up Visit  Amy Scott [id number] 01-23-1991 33 y.o. 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  Unfortunately, she was having some problems with bleeding.  She has been having some issues with bleeding from the rectum.  She went to the ER yesterday.  She had a CT scan that was done.  Everything looked stable.  She still has a little thickening.  Since then, there is been no bleeding.  How much the bleeding could be from the Avastin.  This might be something that we need to think about holding it.  I am going to hold her chemotherapy for couple weeks.  Her weight is down a little bit too much for me.  I want to see her get some weight back on her before we do any further treatment on her.  Her last CEA had come down quite nicely.  Her last CEA back in early November was 3.1.    I am still hoping that we can get her to surgery.  I just do not think that she is in great enough shape right now for surgery.  Currently, I would say that her performance status is probably ECOG 2.    Wt Readings from Last 3 Encounters:  04/19/24 95 lb (43.1 kg)  04/18/24 106 lb (48.1 kg)  03/22/24 104 lb (47.2 kg)     Medications:  Current Outpatient Medications:    Acetaminophen  Extra Strength 500 MG CAPS, Take 2 tablets by mouth 3 (three) times daily as needed. DO NOT EXCEED 6 PILLS OR 3000 MG IN A 24 HOUR PERIOD., Disp: 60 capsule, Rfl: 1   dexamethasone  (DECADRON ) 4 MG tablet, Take 2 tablets (8 mg total) by mouth daily. Take 2 tablets daily x 3 days starting the day after chemotherapy. Take with food., Disp: 30 tablet, Rfl: 1   feeding supplement (ENSURE PLUS HIGH PROTEIN) LIQD, Take 237 mLs by mouth 2 (two) times daily between meals., Disp: 5000 mL,  Rfl: 0   ibuprofen  (ADVIL ) 800 MG tablet, Take 1 tablet (800 mg total) by mouth 3 (three) times daily as needed., Disp: 30 tablet, Rfl: 0   lidocaine -prilocaine  (EMLA ) cream, Apply 1 Application topically as needed., Disp: 30 g, Rfl: 0   loperamide  (IMODIUM ) 2 MG capsule, Take 2 tabs by mouth with first loose stool, then 1 tab with each additional loose stool as needed. Do not exceed 8 tabs in a 24-hour period, Disp: 60 capsule, Rfl: 3   ondansetron  (ZOFRAN ) 8 MG tablet, Take 1 tablet (8 mg total) by mouth every 8 (eight) hours as needed for nausea or vomiting. Start on the third day after chemotherapy, Disp: 30 tablet, Rfl: 1   oxyCODONE  10 MG TABS, Take 1 tablet (10 mg total) by mouth every 6 (six) hours as needed for moderate pain (pain score 4-6)., Disp: 120 tablet, Rfl: 0   polyethylene glycol powder (GLYCOLAX /MIRALAX ) 17 GM/SCOOP powder, Take 17 g dissolved in liquid by mouth 2 (two) times daily., Disp: 238 g, Rfl: 0   prochlorperazine  (COMPAZINE ) 10 MG tablet, Take 1 tablet (10 mg total) by mouth every 6 (six) hours as needed for nausea or vomiting (Nausea or vomiting)., Disp: 30 tablet, Rfl: 1   promethazine  (PHENERGAN ) 25 MG tablet, Take 1 tablet (25 mg total) by  mouth every 6 (six) hours as needed for nausea or vomiting., Disp: 12 tablet, Rfl: 0   amoxicillin-clavulanate (AUGMENTIN) 875-125 MG tablet, Take 1 tablet by mouth every 12 (twelve) hours. (Patient not taking: Reported on 04/19/2024), Disp: 14 tablet, Rfl: 0  Allergies:  Allergies  Allergen Reactions   Morphine And Codeine Hives, Shortness Of Breath, Itching and Swelling    Past Medical History, Surgical history, Social history, and Family History were reviewed and updated.  Review of Systems: Review of Systems  Constitutional: Negative.   HENT:  Negative.    Eyes: Negative.   Respiratory: Negative.    Cardiovascular: Negative.   Gastrointestinal: Negative.   Endocrine: Negative.   Genitourinary: Negative.     Musculoskeletal: Negative.   Skin: Negative.   Neurological: Negative.   Hematological: Negative.   Psychiatric/Behavioral: Negative.      Physical Exam:  height is 5' 4 (1.626 m) and weight is 95 lb (43.1 kg). Her oral temperature is 98.6 F (37 C). Her blood pressure is 105/82 and her pulse is 98. Her respiration is 18 and oxygen saturation is 100%.   Wt Readings from Last 3 Encounters:  04/19/24 95 lb (43.1 kg)  04/18/24 106 lb (48.1 kg)  03/22/24 104 lb (47.2 kg)    Physical Exam Vitals reviewed.  HENT:     Head: Normocephalic and atraumatic.  Eyes:     Pupils: Pupils are equal, round, and reactive to light.  Cardiovascular:     Rate and Rhythm: Normal rate and regular rhythm.     Heart sounds: Normal heart sounds.  Pulmonary:     Effort: Pulmonary effort is normal.     Breath sounds: Normal breath sounds.  Abdominal:     General: Bowel sounds are normal.     Palpations: Abdomen is soft.  Musculoskeletal:        General: No tenderness or deformity. Normal range of motion.     Cervical back: Normal range of motion.  Lymphadenopathy:     Cervical: No cervical adenopathy.  Skin:    General: Skin is warm and dry.     Findings: No erythema or rash.  Neurological:     Mental Status: She is alert and oriented to person, place, and time.  Psychiatric:        Behavior: Behavior normal.        Thought Content: Thought content normal.        Judgment: Judgment normal.      Lab Results  Component Value Date   WBC 25.1 (H) 04/19/2024   HGB 12.4 04/19/2024   HCT 37.0 04/19/2024   MCV 83.1 04/19/2024   PLT 282 04/19/2024     Chemistry      Component Value Date/Time   NA 137 04/19/2024 0910   K 3.5 04/19/2024 0910   CL 102 04/19/2024 0910   CO2 21 (L) 04/19/2024 0910   BUN 6 04/19/2024 0910   CREATININE 0.75 04/19/2024 0910      Component Value Date/Time   CALCIUM  9.2 04/19/2024 0910   ALKPHOS 253 (H) 04/19/2024 0910   AST 25 04/19/2024 0910   ALT 32  04/19/2024 0910   BILITOT <0.2 04/19/2024 0910      Impression and Plan: Amy Scott is a very charming 33 year old African American female.  She has locally advanced rectal cancer.She is being treated neoadjuvantly to shrink things down so that she will be able to undergo surgery.    Again, our goal here is surgical resection.  Again, we will hold on treatment for 2 weeks.  I think this would be very beneficial for her.  I still would like to do 6 cycles of treatment and then repeat her images and hopefully get her to surgery.    [name], MD 11/19/202510:11 AM

## 2024-04-19 NOTE — Progress Notes (Signed)
 Patient has had continued issues with rectal bleeding with another ED visit. She is also losing weight. Treatment will be held for two weeks.   Oncology Nurse Navigator Documentation     04/19/2024    9:45 AM  Oncology Nurse Navigator Flowsheets  Navigator Follow Up Date: 05/03/2024  Navigator Follow Up Reason: Follow-up Appointment;Chemotherapy  Navigator Radiographer, Therapeutic Encounter Type Treatment  Patient Visit Type MedOnc  Treatment Phase Active Tx  Barriers/Navigation Needs Coordination of Care;Education  Interventions Psycho-Social Support  Acuity Level 2-Minimal Needs (1-2 Barriers Identified)  Support Groups/Services Friends and Family  Time Spent with Patient 15

## 2024-04-19 NOTE — Progress Notes (Signed)
 Nutrition Follow Up: Reached out to patient at mobile telephone number during infusion.    ASSESSMENT: Patient is a 33 year old African-American female with metastatic mesorectal lymphadenopathy.  She has 4 children.  She has been fairly healthy.    Unfortunately, she has returned to the ER at twice this past week with   She had some worsening abdominal pain and rectal bleeding.     Treatments with FOLFOX chemotherapy are on hold after weight loss and vomiting for next couple of weeks with goal to improve weight and strength and health prior to surgery.   Patient reports feeling a little better, today.  Had a pop tart and orange juice this morning and  [employer] sandwich with lunch.  She reports no pain with eating but after 30-40 minutes she has pressure after eating pain increases to 9-10.  Vomited 3 times Sunday twice Monday.  She has been better since and hasn't vomited yesterday or today.  She has been taking 2 Ensure Clear when not eating.    Medications: reviewed   Labs: 04/19/24  reviewed   Anthropometrics:  Down 9# (8.7%) past month significant for timeframe  Height: 64 Weight:  04/19/24  95# 04/18/24  106# 03/22/24  104# 03/08/24  99.6# 02/23/24  103# 02/22/24  101# 02/16/24  100.5# UBW: 110# BMI: 16.31   Estimated Energy Needs  Kcals: 1400-1700 Protein: 56-71 g Fluid: 2 L   NUTRITION DIAGNOSIS: Inadequate PO intake to meet increased nutrient nutrient need with cancer. Ongoing  INTERVENTION:   Encouraged trial of blended smoothies and soups to assess impact on pain and digestibility. Sent recipes in email.  Encourage small frequent feeds. With bland lean proteins (also encouraged ground meats for improve digestion)  Encouraged higher calorie fluids to increase calories (diluted juices).   Reviewed strategies for nausea. Gingerale peppermints ginger candies.  Encourage increasing Ensure clear to boost protein and calories to 3-4 if needed to regain  weight.  [email or web address]   MONITORING, EVALUATION, GOAL: weight trends, nutrition impact symptoms, PO intake, labs   Next Visit: remote next week  Micheline Craven, RDN, LDN Registered Dietitian, [facility] Part Time Remote (Usual office hours: Tuesday-Thursday) Mobile: [phone]

## 2024-04-19 NOTE — Progress Notes (Signed)
 [facility] CSW Progress Note  Visual Merchandiser met with patient to follow-up on emotional support.  Patient did not receive treatment today.  She reports feeling well with no needs at this time.    Interventions: Provided brief mental health counseling with regard to adjusting to her illness.       Follow Up Plan:  CSW will follow-up with patient by phone     Amy Scott Au, LCSW Clinical Social Worker [facility]    Patient is participating in a Managed Medicaid Plan:  Yes

## 2024-04-20 ENCOUNTER — Other Ambulatory Visit: Payer: Self-pay

## 2024-04-21 ENCOUNTER — Encounter

## 2024-04-25 ENCOUNTER — Inpatient Hospital Stay: Payer: Self-pay | Admitting: Nurse Practitioner

## 2024-04-25 ENCOUNTER — Inpatient Hospital Stay: Payer: Self-pay | Admitting: Dietician

## 2024-04-25 NOTE — Progress Notes (Signed)
 Nutrition Follow Up: Reached out to patient at mobile telephone number   ASSESSMENT: Patient is a 33 year old African-American female with metastatic mesorectal lymphadenopathy.  She has 4 children.  She has been fairly healthy.   No more visits to ER since we spoke last week. She reports still having abdominal pain especially when she has to have a bowel movement.  She was having 3-4 formed stools per day, but hasn't had bowel movement for past 2 days.  She denies any vomiting since we last spoke. Little change to PO intake.  Still taking 2 Ensure Clear.  Eating fruits daily some strawberries, mangos, pears.  Eating cereals low in insoluble fiber (honey nut cheerios, frosted flakes).  Doesn't like soups, hasn't been blending smoothies.  Will drink coffee.    Medications: reviewed   Labs: 04/19/24  reviewed   Anthropometrics:  Down 9# (8.7%) past month significant for timeframe  Height: 64 Weight:  04/19/24  95# 04/18/24  106# 03/22/24  104# 03/08/24  99.6# 02/23/24  103# 02/22/24  101# 02/16/24  100.5# UBW: 110# BMI: 16.31   Estimated Energy Needs  Kcals: 1400-1700 Protein: 56-71 g Fluid: 2 L   NUTRITION DIAGNOSIS: Inadequate PO intake to meet increased nutrient nutrient need with cancer. Ongoing  INTERVENTION:   Encouraged reducing insoluble fiber.  Encouraged smoothies, melons, d/c skins and seeds/  Encourage small frequent feeds with liquids between meals.  Encouraged apple juice to aid with bowel movements, she doesn't like prunes or prune juice.   Encourage increasing Ensure clear to boost protein and calories to 3-4 if needed to regain weight.  Reviewed fluid needs, she doesn't know how much she's drinking now.   Encouraged talking with MD about Mira lax in addition to stool softener on second day after no bowel movement.  [email or web address]   MONITORING, EVALUATION, GOAL: weight trends, nutrition impact symptoms, PO intake, labs   Next Visit: remote  next week after MD follow up during infusion  Micheline Craven, RDN, LDN Registered Dietitian, [facility] Part Time Remote (Usual office hours: Tuesday-Thursday) Mobile: [phone]

## 2024-04-26 ENCOUNTER — Telehealth: Payer: Self-pay

## 2024-04-26 NOTE — Telephone Encounter (Signed)
 Pt referral to palliative care will be closed at this time. Pt has had multiple appts with cancels or no-shows. Palliative services available upon re-consult.

## 2024-05-01 ENCOUNTER — Telehealth: Payer: Self-pay | Admitting: Hematology & Oncology

## 2024-05-01 ENCOUNTER — Encounter: Payer: Self-pay | Admitting: Hematology & Oncology

## 2024-05-01 ENCOUNTER — Inpatient Hospital Stay: Payer: MEDICAID | Attending: Hematology & Oncology | Admitting: Genetic Counselor

## 2024-05-01 ENCOUNTER — Inpatient Hospital Stay: Payer: MEDICAID

## 2024-05-01 DIAGNOSIS — E876 Hypokalemia: Secondary | ICD-10-CM | POA: Insufficient documentation

## 2024-05-01 DIAGNOSIS — C19 Malignant neoplasm of rectosigmoid junction: Secondary | ICD-10-CM | POA: Insufficient documentation

## 2024-05-01 DIAGNOSIS — Z5111 Encounter for antineoplastic chemotherapy: Secondary | ICD-10-CM | POA: Insufficient documentation

## 2024-05-01 DIAGNOSIS — Z452 Encounter for adjustment and management of vascular access device: Secondary | ICD-10-CM | POA: Insufficient documentation

## 2024-05-01 DIAGNOSIS — Z5189 Encounter for other specified aftercare: Secondary | ICD-10-CM | POA: Insufficient documentation

## 2024-05-01 DIAGNOSIS — G893 Neoplasm related pain (acute) (chronic): Secondary | ICD-10-CM | POA: Insufficient documentation

## 2024-05-01 DIAGNOSIS — E860 Dehydration: Secondary | ICD-10-CM | POA: Insufficient documentation

## 2024-05-01 DIAGNOSIS — D649 Anemia, unspecified: Secondary | ICD-10-CM | POA: Insufficient documentation

## 2024-05-01 DIAGNOSIS — Z5112 Encounter for antineoplastic immunotherapy: Secondary | ICD-10-CM | POA: Insufficient documentation

## 2024-05-01 NOTE — Telephone Encounter (Signed)
 Good Morning, courteous notification of patient being no show for genetic referral today. Thank you

## 2024-05-03 ENCOUNTER — Other Ambulatory Visit: Payer: Self-pay

## 2024-05-03 ENCOUNTER — Inpatient Hospital Stay: Payer: Self-pay | Admitting: Hematology & Oncology

## 2024-05-03 ENCOUNTER — Inpatient Hospital Stay: Payer: MEDICAID | Admitting: Dietician

## 2024-05-03 ENCOUNTER — Inpatient Hospital Stay: Payer: MEDICAID

## 2024-05-03 ENCOUNTER — Other Ambulatory Visit (HOSPITAL_COMMUNITY): Payer: Self-pay

## 2024-05-03 ENCOUNTER — Other Ambulatory Visit: Payer: Self-pay | Admitting: *Deleted

## 2024-05-03 ENCOUNTER — Other Ambulatory Visit (HOSPITAL_BASED_OUTPATIENT_CLINIC_OR_DEPARTMENT_OTHER): Payer: Self-pay

## 2024-05-03 ENCOUNTER — Encounter: Payer: Self-pay | Admitting: *Deleted

## 2024-05-03 ENCOUNTER — Inpatient Hospital Stay: Payer: Self-pay

## 2024-05-03 ENCOUNTER — Encounter: Payer: Self-pay | Admitting: Hematology & Oncology

## 2024-05-03 VITALS — BP 95/71 | HR 102 | Temp 98.6°F | Resp 16 | Wt 99.4 lb

## 2024-05-03 VITALS — HR 92

## 2024-05-03 DIAGNOSIS — C19 Malignant neoplasm of rectosigmoid junction: Secondary | ICD-10-CM

## 2024-05-03 DIAGNOSIS — Z5111 Encounter for antineoplastic chemotherapy: Secondary | ICD-10-CM | POA: Diagnosis present

## 2024-05-03 DIAGNOSIS — D649 Anemia, unspecified: Secondary | ICD-10-CM | POA: Diagnosis not present

## 2024-05-03 DIAGNOSIS — E860 Dehydration: Secondary | ICD-10-CM | POA: Diagnosis not present

## 2024-05-03 DIAGNOSIS — G893 Neoplasm related pain (acute) (chronic): Secondary | ICD-10-CM | POA: Diagnosis not present

## 2024-05-03 DIAGNOSIS — Z5112 Encounter for antineoplastic immunotherapy: Secondary | ICD-10-CM | POA: Diagnosis present

## 2024-05-03 DIAGNOSIS — Z452 Encounter for adjustment and management of vascular access device: Secondary | ICD-10-CM | POA: Diagnosis not present

## 2024-05-03 DIAGNOSIS — E876 Hypokalemia: Secondary | ICD-10-CM | POA: Diagnosis not present

## 2024-05-03 DIAGNOSIS — Z5189 Encounter for other specified aftercare: Secondary | ICD-10-CM | POA: Diagnosis not present

## 2024-05-03 LAB — CMP (CANCER CENTER ONLY)
ALT: 11 U/L (ref 0–44)
AST: 19 U/L (ref 15–41)
Albumin: 4 g/dL (ref 3.5–5.0)
Alkaline Phosphatase: 104 U/L (ref 38–126)
Anion gap: 11 (ref 5–15)
BUN: 8 mg/dL (ref 6–20)
CO2: 23 mmol/L (ref 22–32)
Calcium: 9.2 mg/dL (ref 8.9–10.3)
Chloride: 103 mmol/L (ref 98–111)
Creatinine: 0.53 mg/dL (ref 0.44–1.00)
GFR, Estimated: >60 mL/min (ref >60)
Glucose, Bld: 117 mg/dL — ABNORMAL HIGH (ref 70–99)
Potassium: 3.8 mmol/L (ref 3.5–5.1)
Sodium: 137 mmol/L (ref 135–145)
Total Bilirubin: 0.3 mg/dL (ref 0.0–1.2)
Total Protein: 6.8 g/dL (ref 6.5–8.1)

## 2024-05-03 LAB — CBC WITH DIFFERENTIAL (CANCER CENTER ONLY)
Abs Immature Granulocytes: 0.04 K/uL (ref 0.00–0.07)
Basophils Absolute: 0 K/uL (ref 0.0–0.1)
Basophils Relative: 0 %
Eosinophils Absolute: 0 K/uL (ref 0.0–0.5)
Eosinophils Relative: 0 %
HCT: 34.5 % — ABNORMAL LOW (ref 36.0–46.0)
Hemoglobin: 11.5 g/dL — ABNORMAL LOW (ref 12.0–15.0)
Immature Granulocytes: 0 %
Lymphocytes Relative: 27 %
Lymphs Abs: 3.4 K/uL (ref 0.7–4.0)
MCH: 28.7 pg (ref 26.0–34.0)
MCHC: 33.3 g/dL (ref 30.0–36.0)
MCV: 86 fL (ref 80.0–100.0)
Monocytes Absolute: 1.2 K/uL — ABNORMAL HIGH (ref 0.1–1.0)
Monocytes Relative: 9 %
Neutro Abs: 8.2 K/uL — ABNORMAL HIGH (ref 1.7–7.7)
Neutrophils Relative %: 64 %
Platelet Count: 304 K/uL (ref 150–400)
RBC: 4.01 MIL/uL (ref 3.87–5.11)
RDW: 23.2 % — ABNORMAL HIGH (ref 11.5–15.5)
WBC Count: 12.9 K/uL — ABNORMAL HIGH (ref 4.0–10.5)
nRBC: 0 % (ref 0.0–0.2)

## 2024-05-03 LAB — IRON AND IRON BINDING CAPACITY (CC-WL,HP ONLY)
Iron: 91 ug/dL (ref 28–170)
Saturation Ratios: 35 % — ABNORMAL HIGH (ref 10.4–31.8)
TIBC: 259 ug/dL (ref 250–450)
UIBC: 168 ug/dL

## 2024-05-03 LAB — FERRITIN: Ferritin: 604 ng/mL — ABNORMAL HIGH (ref 11–307)

## 2024-05-03 LAB — CEA (ACCESS): CEA (CHCC): 1.83 ng/mL (ref 0.00–5.00)

## 2024-05-03 LAB — PREGNANCY, URINE: Preg Test, Ur: NEGATIVE

## 2024-05-03 MED ORDER — SODIUM CHLORIDE 0.9 % IV SOLN
5.0000 mg/kg | Freq: Once | INTRAVENOUS | Status: AC
  Administered 2024-05-03: 225 mg via INTRAVENOUS
  Filled 2024-05-03: qty 9

## 2024-05-03 MED ORDER — FLUOROURACIL CHEMO INJECTION 2.5 GM/50ML
400.0000 mg/m2 | Freq: Once | INTRAVENOUS | Status: AC
  Administered 2024-05-03: 600 mg via INTRAVENOUS
  Filled 2024-05-03: qty 12

## 2024-05-03 MED ORDER — XTAMPZA ER 18 MG PO C12A
18.0000 mg | EXTENDED_RELEASE_CAPSULE | Freq: Two times a day (BID) | ORAL | 0 refills | Status: AC
  Filled 2024-05-03 – 2024-05-15 (×2): qty 60, 30d supply, fill #0

## 2024-05-03 MED ORDER — SODIUM CHLORIDE 0.9 % IV SOLN
400.0000 mg/m2 | Freq: Once | INTRAVENOUS | Status: AC
  Administered 2024-05-03: 584 mg via INTRAVENOUS
  Filled 2024-05-03 (×2): qty 29.2

## 2024-05-03 MED ORDER — SODIUM CHLORIDE 0.9 % IV SOLN
150.0000 mg | Freq: Once | INTRAVENOUS | Status: AC
  Administered 2024-05-03: 150 mg via INTRAVENOUS
  Filled 2024-05-03: qty 150

## 2024-05-03 MED ORDER — DEXAMETHASONE SOD PHOSPHATE PF 10 MG/ML IJ SOLN
10.0000 mg | Freq: Once | INTRAMUSCULAR | Status: AC
  Administered 2024-05-03: 10 mg via INTRAVENOUS

## 2024-05-03 MED ORDER — XTAMPZA ER 18 MG PO C12A: 18.0000 mg | EXTENDED_RELEASE_CAPSULE | Freq: Two times a day (BID) | ORAL | 0 refills | Status: DC

## 2024-05-03 MED ORDER — SODIUM CHLORIDE 0.9 % IV SOLN
180.0000 mg/m2 | Freq: Once | INTRAVENOUS | Status: AC
  Administered 2024-05-03: 260 mg via INTRAVENOUS
  Filled 2024-05-03: qty 5

## 2024-05-03 MED ORDER — SODIUM CHLORIDE 0.9 % IV SOLN: INTRAVENOUS | Status: DC

## 2024-05-03 MED ORDER — OXYCODONE HCL 10 MG PO TABS: 10.0000 mg | ORAL_TABLET | Freq: Four times a day (QID) | ORAL | 0 refills | Status: DC | PRN

## 2024-05-03 MED ORDER — SODIUM CHLORIDE 0.9 % IV SOLN
2000.0000 mg/m2 | INTRAVENOUS | Status: DC
  Administered 2024-05-03: 2900 mg via INTRAVENOUS
  Filled 2024-05-03: qty 58

## 2024-05-03 MED ORDER — DEXTROSE 5 % IV SOLN: INTRAVENOUS | Status: DC

## 2024-05-03 MED ORDER — ATROPINE SULFATE 1 MG/ML IV SOLN
0.5000 mg | Freq: Once | INTRAVENOUS | Status: AC | PRN
  Administered 2024-05-03: 0.5 mg via INTRAVENOUS
  Filled 2024-05-03: qty 1

## 2024-05-03 MED ORDER — OXYCODONE HCL 10 MG PO TABS
10.0000 mg | ORAL_TABLET | Freq: Four times a day (QID) | ORAL | 0 refills | Status: DC | PRN
  Filled 2024-05-03 (×3): qty 120, 30d supply, fill #0

## 2024-05-03 MED ORDER — OXALIPLATIN CHEMO INJECTION 100 MG/20ML
85.0000 mg/m2 | Freq: Once | INTRAVENOUS | Status: AC
  Administered 2024-05-03: 125 mg via INTRAVENOUS
  Filled 2024-05-03: qty 20

## 2024-05-03 MED ORDER — PALONOSETRON HCL INJECTION 0.25 MG/5ML
0.2500 mg | Freq: Once | INTRAVENOUS | Status: AC
  Administered 2024-05-03: 0.25 mg via INTRAVENOUS
  Filled 2024-05-03: qty 5

## 2024-05-03 NOTE — Progress Notes (Signed)
 Nutrition Follow Up: Reached out to patient at mobile telephone number during infusion.  ASSESSMENT: Patient is a 33 year old African-American female with metastatic mesorectal lymphadenopathy.     She reports still having abdominal pain but bowels now "pretty normal."  Last BM was last night.  She's still difficult to interview as she relays little detail and answers with few words.   Eating good, when asked what that means to her said eating meals without feeling too full.  Medications: reviewed   Labs: 05/03/24  Hgb 11.5   Anthropometrics:  Weight rebounded 4# past 2 weeks  Height: 64 Weight:  05/03/24  99.4# 04/19/24  95# 04/18/24  106# 03/22/24  104# 03/08/24  99.6# 02/23/24  103# 02/22/24  101# 02/16/24  100.5# UBW: 110# BMI: 17.06   Estimated Energy Needs  Kcals: 1400-1700 Protein: 56-71 g Fluid: 2 L   NUTRITION DIAGNOSIS: Inadequate PO intake to meet increased nutrient nutrient need with cancer. Resolving.  INTERVENTION:   Encourage sustained weight gain 2-4# per month to aid in replacing LBM.  Offered recipes or resources to aid with rebuilding LBM and improving blood counts, she declined.  Encouraged apple juice to aid with bowel movements, she doesn't like prunes or prune juice.   Encouraged mixed meals with soft proteins and nutrient dense side.  [email or web address]   MONITORING, EVALUATION, GOAL: weight trends, nutrition impact symptoms, PO intake, labs   Next Visit: remote next month  Micheline Craven, RDN, LDN Registered Dietitian, [facility] Part Time Remote (Usual office hours: Tuesday-Thursday) Mobile: [phone]

## 2024-05-03 NOTE — Progress Notes (Signed)
 [facility] CSW Progress Note  Covering Clinical Social Workers referred to patient by patient's statistician. Patients NN reported patient has not been able to receive medication for appox. 3 weeks due to cancellation of medicaid. Patient reported attempts of reaching DSS caseworker with no success. Energy Manager assisted with having patient pick up medications at [facility] through the [employer]. Covering CSW relayed message to eligibility caseworker for guidance of patient receiving contact from caseworker, awaiting response. NN made aware, CSW can not guarantee return contact from DSS will be made. Primary CSW will be made aware upon return to work.    Lizbeth Sprague, LCSW Clinical Social Worker [facility]

## 2024-05-03 NOTE — Patient Instructions (Signed)

## 2024-05-03 NOTE — Addendum Note (Signed)
 Addended by: TIMMY COY R on: 05/03/2024 11:23 AM   Modules accepted: Orders

## 2024-05-03 NOTE — Patient Instructions (Signed)
 [facility] - A DEPT OF [facility]  Discharge Instructions: Thank you for choosing [facility] to provide your oncology and hematology care.   If you have a lab appointment with the Cancer Center, please go directly to the Cancer Center and check in at the registration area.  Wear comfortable clothing and clothing appropriate for easy access to any Portacath or PICC line.   We strive to give you quality time with your provider. You may need to reschedule your appointment if you arrive late (15 or more minutes).  Arriving late affects you and other patients whose appointments are after yours.  Also, if you miss three or more appointments without notifying the office, you may be dismissed from the clinic at the provider's discretion.      For prescription refill requests, have your pharmacy contact our office and allow 72 hours for refills to be completed.    Today you received the following chemotherapy and/or immunotherapy agents 5FU, Oxaliplatin , Irinotecan       To help prevent nausea and vomiting after your treatment, we encourage you to take your nausea medication as directed.  BELOW ARE SYMPTOMS THAT SHOULD BE REPORTED IMMEDIATELY: *FEVER GREATER THAN 100.4 F (38 C) OR HIGHER *CHILLS OR SWEATING *NAUSEA AND VOMITING THAT IS NOT CONTROLLED WITH YOUR NAUSEA MEDICATION *UNUSUAL SHORTNESS OF BREATH *UNUSUAL BRUISING OR BLEEDING *URINARY PROBLEMS (pain or burning when urinating, or frequent urination) *BOWEL PROBLEMS (unusual diarrhea, constipation, pain near the anus) TENDERNESS IN MOUTH AND THROAT WITH OR WITHOUT PRESENCE OF ULCERS (sore throat, sores in mouth, or a toothache) UNUSUAL RASH, SWELLING OR PAIN  UNUSUAL VAGINAL DISCHARGE OR ITCHING   Items with * indicate a potential emergency and should be followed up as soon as possible or go to the Emergency Department if any problems should occur.  Please show the CHEMOTHERAPY ALERT CARD  or IMMUNOTHERAPY ALERT CARD at check-in to the Emergency Department and triage nurse. Should you have questions after your visit or need to cancel or reschedule your appointment, please contact [facility] - A DEPT OF [facility]  [phone] and follow the prompts.  Office hours are 8:00 a.m. to 4:30 p.m. Monday - Friday. Please note that voicemails left after 4:00 p.m. may not be returned until the following business day.  We are closed weekends and major holidays. You have access to a nurse at all times for urgent questions. Please call the main number to the clinic [phone] and follow the prompts.  For any non-urgent questions, you may also contact your provider using MyChart. We now offer e-Visits for anyone 53 and older to request care online for non-urgent symptoms. For details visit [email or web address].   Also download the MyChart app! Go to the app store, search MyChart, open the app, select [place], and log in with your MyChart username and password.

## 2024-05-03 NOTE — Progress Notes (Signed)
 Patient has done well the past two weeks. She is tolerating treatment well. She will proceed with cycle five today.   Oncology Nurse Navigator Documentation     05/03/2024    9:30 AM  Oncology Nurse Navigator Flowsheets  Navigator Follow Up Date: 05/17/2024  Navigator Follow Up Reason: Follow-up Appointment;Chemotherapy  Navigator Location [facility]  Navigator Encounter Type Treatment  Patient Visit Type MedOnc  Treatment Phase Active Tx  Barriers/Navigation Needs Coordination of Care;Education  Interventions None Required  Acuity Level 1-No Barriers  Support Groups/Services Friends and Family  Time Spent with Patient 15

## 2024-05-03 NOTE — Progress Notes (Signed)
 Hematology and Oncology Follow Up Visit  Amy Scott [id number] 11/23/1990 33 y.o. 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 she does look good.  She actually had a very nice Thanksgiving.  I months that she is trying really really hard.  I give us  much credit.  She is showing a lot of toughness.  There is been no bleeding.  She is having no real problems going to the bathroom.  She has had no issues with cough or shortness of breath.  She has had no nausea or vomiting.  Her weight is still quite low.  We really need to continue to work on her nutritional state.  Her last CEA level was 2.48.  Again this has been coming down slowly but steadily..  She has had no leg swelling.  She has had no rashes.  Overall, I would have to say that her performance status is probably ECOG 2.    Wt Readings from Last 3 Encounters:  05/03/24 99 lb 6.4 oz (45.1 kg)  04/19/24 95 lb (43.1 kg)  04/18/24 106 lb (48.1 kg)     Medications:  Current Outpatient Medications:    Acetaminophen  Extra Strength 500 MG CAPS, Take 2 tablets by mouth 3 (three) times daily as needed. DO NOT EXCEED 6 PILLS OR 3000 MG IN A 24 HOUR PERIOD., Disp: 60 capsule, Rfl: 1   amoxicillin -clavulanate (AUGMENTIN ) 875-125 MG tablet, Take 1 tablet by mouth every 12 (twelve) hours. (Patient not taking: Reported on 04/19/2024), Disp: 14 tablet, Rfl: 0   dexamethasone  (DECADRON ) 4 MG tablet, Take 2 tablets (8 mg total) by mouth daily. Take 2 tablets daily x 3 days starting the day after chemotherapy. Take with food., Disp: 30 tablet, Rfl: 1   feeding supplement (ENSURE PLUS HIGH PROTEIN) LIQD, Take 237 mLs by mouth 2 (two) times daily between meals., Disp: 5000 mL, Rfl: 0   ibuprofen  (ADVIL ) 800 MG tablet, Take 1  tablet (800 mg total) by mouth 3 (three) times daily as needed., Disp: 30 tablet, Rfl: 0   lidocaine -prilocaine  (EMLA ) cream, Apply 1 Application topically as needed., Disp: 30 g, Rfl: 0   loperamide  (IMODIUM ) 2 MG capsule, Take 2 tabs by mouth with first loose stool, then 1 tab with each additional loose stool as needed. Do not exceed 8 tabs in a 24-hour period, Disp: 60 capsule, Rfl: 3   ondansetron  (ZOFRAN ) 8 MG tablet, Take 1 tablet (8 mg total) by mouth every 8 (eight) hours as needed for nausea or vomiting. Start on the third day after chemotherapy, Disp: 30 tablet, Rfl: 1   oxyCODONE  10 MG TABS, Take 1 tablet (10 mg total) by mouth every 6 (six) hours as needed for moderate pain (pain score 4-6)., Disp: 120 tablet, Rfl: 0   polyethylene glycol powder (GLYCOLAX /MIRALAX ) 17 GM/SCOOP powder, Take 17 g dissolved in liquid by mouth 2 (two) times daily., Disp: 238 g, Rfl: 0   prochlorperazine  (COMPAZINE ) 10 MG tablet, Take 1 tablet (10 mg total) by mouth every 6 (six) hours as needed for nausea or vomiting (Nausea or vomiting)., Disp: 30 tablet, Rfl: 1   promethazine  (PHENERGAN ) 25 MG tablet, Take 1 tablet (25 mg total) by mouth every 6 (six) hours as needed for nausea or vomiting., Disp: 12 tablet, Rfl: 0  Allergies:  Allergies  Allergen Reactions   Morphine And Codeine Hives, Shortness Of Breath, Itching and Swelling    Past Medical History, Surgical history, Social history, and Family History were reviewed and updated.  Review of Systems: Review of Systems  Constitutional: Negative.   HENT:  Negative.    Eyes: Negative.   Respiratory: Negative.    Cardiovascular: Negative.   Gastrointestinal: Negative.   Endocrine: Negative.   Genitourinary: Negative.    Musculoskeletal: Negative.   Skin: Negative.   Neurological: Negative.   Hematological: Negative.   Psychiatric/Behavioral: Negative.      Physical Exam:  weight is 99 lb 6.4 oz (45.1 kg). Her oral temperature is 98.6 F (37  C). Her blood pressure is 95/71 and her pulse is 102 (abnormal). Her respiration is 16 and oxygen saturation is 100%.   Wt Readings from Last 3 Encounters:  05/03/24 99 lb 6.4 oz (45.1 kg)  04/19/24 95 lb (43.1 kg)  04/18/24 106 lb (48.1 kg)    Physical Exam Vitals reviewed.  HENT:     Head: Normocephalic and atraumatic.  Eyes:     Pupils: Pupils are equal, round, and reactive to light.  Cardiovascular:     Rate and Rhythm: Normal rate and regular rhythm.     Heart sounds: Normal heart sounds.  Pulmonary:     Effort: Pulmonary effort is normal.     Breath sounds: Normal breath sounds.  Abdominal:     General: Bowel sounds are normal.     Palpations: Abdomen is soft.  Musculoskeletal:        General: No tenderness or deformity. Normal range of motion.     Cervical back: Normal range of motion.  Lymphadenopathy:     Cervical: No cervical adenopathy.  Skin:    General: Skin is warm and dry.     Findings: No erythema or rash.  Neurological:     Mental Status: She is alert and oriented to person, place, and time.  Psychiatric:        Behavior: Behavior normal.        Thought Content: Thought content normal.        Judgment: Judgment normal.      Lab Results  Component Value Date   WBC 12.9 (H) 05/03/2024   HGB 11.5 (L) 05/03/2024   HCT 34.5 (L) 05/03/2024   MCV 86.0 05/03/2024   PLT 304 05/03/2024     Chemistry      Component Value Date/Time   NA 137 05/03/2024 0845   K 3.8 05/03/2024 0845   CL 103 05/03/2024 0845   CO2 23 05/03/2024 0845   BUN 8 05/03/2024 0845   CREATININE 0.53 05/03/2024 0845      Component Value Date/Time   CALCIUM  9.2 05/03/2024 0845   ALKPHOS 104 05/03/2024 0845   AST 19 05/03/2024 0845   ALT 11 05/03/2024 0845   BILITOT 0.3 05/03/2024 0845      Impression and Plan: Amy Scott is a very charming 33 year old African American female.  She has locally advanced rectal cancer.She is being treated neoadjuvantly to shrink things down  so that she will be able to undergo surgery.    I still like to give her 6 cycles of treatment.  I would then we evaluate her.  After that, we may be able to consider radiation therapy with low-dose chemotherapy (i.e. Xeloda).  Again our goal is ultimately to have her get GIST surgery.  I want to try to have shrinkage is much as  possible before that happens..    She also needs to have significant improvement in her nutritional state that she would undergo surgery.  We will go ahead for her fifth cycle.  I will then plan to get her back in 2 more weeks for sixth cycle.  I will then plan for all of her follow-up.   [name], MD 12/3/202510:21 AM

## 2024-05-04 ENCOUNTER — Other Ambulatory Visit: Payer: Self-pay

## 2024-05-04 ENCOUNTER — Encounter: Payer: Self-pay | Admitting: Hematology & Oncology

## 2024-05-04 ENCOUNTER — Other Ambulatory Visit: Payer: Self-pay | Admitting: *Deleted

## 2024-05-04 ENCOUNTER — Other Ambulatory Visit (HOSPITAL_BASED_OUTPATIENT_CLINIC_OR_DEPARTMENT_OTHER): Payer: Self-pay

## 2024-05-04 ENCOUNTER — Other Ambulatory Visit (HOSPITAL_COMMUNITY): Payer: Self-pay

## 2024-05-04 ENCOUNTER — Encounter: Payer: Self-pay | Admitting: *Deleted

## 2024-05-04 DIAGNOSIS — C19 Malignant neoplasm of rectosigmoid junction: Secondary | ICD-10-CM

## 2024-05-04 MED ORDER — PROCHLORPERAZINE MALEATE 10 MG PO TABS
10.0000 mg | ORAL_TABLET | Freq: Four times a day (QID) | ORAL | 1 refills | Status: AC | PRN
  Filled 2024-05-04: qty 30, 8d supply, fill #0

## 2024-05-04 NOTE — Progress Notes (Signed)
 Patient's medicaid has been changed to inactive due to some new requirements. Patient has been trying to work through their requests, but she has been unable to reach her medicaid case worker for over three weeks. She is in need of medications.  Spoke with social work and she can careers adviser for her medications. She was able to fill her OXY IR yesterday using almost $300 of her grant, but the long acting medication was too costly. Alternative looked at, but she is allergic to morphine and all other options are too expensive. She will hold on long acting medication at this time and hopefully her medicaid will be sorted out soon.   Patient also needed her compazine  which we were able to fill today using her grant. No other medications can be filled due to insurance/out of pocket cost. Our social work has contacted their point of contact at [facility] to try to move conversations forward. Patient is encouraged to continue trying to reach out to fix the insurance issue.   Oncology Nurse Navigator Documentation     05/04/2024    2:00 PM  Oncology Nurse Navigator Flowsheets  Navigator Location [facility]  Navigator Encounter Type Other:  Patient Visit Type MedOnc  Treatment Phase Active Tx  Barriers/Navigation Needs Coordination of Care;Education  Interventions Medication Assistance  Acuity Level 1-No Barriers  Support Groups/Services Friends and Family  Time Spent with Patient 74

## 2024-05-05 ENCOUNTER — Other Ambulatory Visit (HOSPITAL_COMMUNITY): Payer: Self-pay

## 2024-05-05 ENCOUNTER — Inpatient Hospital Stay: Payer: MEDICAID

## 2024-05-05 LAB — PREALBUMIN: Prealbumin: 17 mg/dL — ABNORMAL LOW (ref 18–38)

## 2024-05-05 NOTE — Patient Instructions (Signed)

## 2024-05-08 ENCOUNTER — Encounter: Payer: Self-pay | Admitting: Hematology & Oncology

## 2024-05-10 ENCOUNTER — Encounter: Payer: Self-pay | Admitting: Hematology & Oncology

## 2024-05-11 ENCOUNTER — Other Ambulatory Visit (HOSPITAL_BASED_OUTPATIENT_CLINIC_OR_DEPARTMENT_OTHER): Payer: Self-pay

## 2024-05-11 ENCOUNTER — Telehealth: Payer: Self-pay | Admitting: *Deleted

## 2024-05-11 ENCOUNTER — Other Ambulatory Visit: Payer: Self-pay | Admitting: *Deleted

## 2024-05-11 ENCOUNTER — Encounter: Payer: Self-pay | Admitting: Hematology & Oncology

## 2024-05-11 DIAGNOSIS — C19 Malignant neoplasm of rectosigmoid junction: Secondary | ICD-10-CM

## 2024-05-11 DIAGNOSIS — D649 Anemia, unspecified: Secondary | ICD-10-CM

## 2024-05-11 DIAGNOSIS — E860 Dehydration: Secondary | ICD-10-CM

## 2024-05-11 MED ORDER — LORAZEPAM 0.5 MG PO TABS
0.5000 mg | ORAL_TABLET | Freq: Three times a day (TID) | ORAL | 0 refills | Status: DC
  Filled 2024-05-11: qty 30, 10d supply, fill #0

## 2024-05-11 NOTE — Telephone Encounter (Signed)
 Received call from patient that she has been vomiting for the last three days. Zofran  and Compazine  are not working. Spoke with Dr. Timmy and he is calling in Lorazepam . Patient has agreed to come in tomorrow for labs/PF/ IVF. LOS sent to scheduling. Orders are under signed and held for the infusion nurse to release. She verbalized understanding.

## 2024-05-12 ENCOUNTER — Inpatient Hospital Stay: Payer: Self-pay

## 2024-05-12 DIAGNOSIS — E860 Dehydration: Secondary | ICD-10-CM

## 2024-05-12 DIAGNOSIS — D649 Anemia, unspecified: Secondary | ICD-10-CM

## 2024-05-12 DIAGNOSIS — C19 Malignant neoplasm of rectosigmoid junction: Secondary | ICD-10-CM

## 2024-05-12 DIAGNOSIS — Z5112 Encounter for antineoplastic immunotherapy: Secondary | ICD-10-CM | POA: Diagnosis not present

## 2024-05-12 LAB — CBC WITH DIFFERENTIAL (CANCER CENTER ONLY)
Abs Immature Granulocytes: 0.03 K/uL (ref 0.00–0.07)
Basophils Absolute: 0 K/uL (ref 0.0–0.1)
Basophils Relative: 0 %
Eosinophils Absolute: 0.1 K/uL (ref 0.0–0.5)
Eosinophils Relative: 1 %
HCT: 34.1 % — ABNORMAL LOW (ref 36.0–46.0)
Hemoglobin: 11.5 g/dL — ABNORMAL LOW (ref 12.0–15.0)
Immature Granulocytes: 0 %
Lymphocytes Relative: 37 %
Lymphs Abs: 3.3 K/uL (ref 0.7–4.0)
MCH: 28.8 pg (ref 26.0–34.0)
MCHC: 33.7 g/dL (ref 30.0–36.0)
MCV: 85.5 fL (ref 80.0–100.0)
Monocytes Absolute: 0.6 K/uL (ref 0.1–1.0)
Monocytes Relative: 7 %
Neutro Abs: 4.8 K/uL (ref 1.7–7.7)
Neutrophils Relative %: 55 %
Platelet Count: 215 K/uL (ref 150–400)
RBC: 3.99 MIL/uL (ref 3.87–5.11)
RDW: 21 % — ABNORMAL HIGH (ref 11.5–15.5)
WBC Count: 8.8 K/uL (ref 4.0–10.5)
nRBC: 0 % (ref 0.0–0.2)

## 2024-05-12 LAB — CMP (CANCER CENTER ONLY)
ALT: 17 U/L (ref 0–44)
AST: 18 U/L (ref 15–41)
Albumin: 4 g/dL (ref 3.5–5.0)
Alkaline Phosphatase: 98 U/L (ref 38–126)
Anion gap: 11 (ref 5–15)
BUN: 8 mg/dL (ref 6–20)
CO2: 23 mmol/L (ref 22–32)
Calcium: 8.9 mg/dL (ref 8.9–10.3)
Chloride: 104 mmol/L (ref 98–111)
Creatinine: 0.46 mg/dL (ref 0.44–1.00)
GFR, Estimated: >60 mL/min (ref >60)
Glucose, Bld: 115 mg/dL — ABNORMAL HIGH (ref 70–99)
Potassium: 3.7 mmol/L (ref 3.5–5.1)
Sodium: 138 mmol/L (ref 135–145)
Total Bilirubin: <0.2 mg/dL (ref 0.0–1.2)
Total Protein: 6.8 g/dL (ref 6.5–8.1)

## 2024-05-12 LAB — MAGNESIUM: Magnesium: 1.9 mg/dL (ref 1.7–2.4)

## 2024-05-12 MED ORDER — SODIUM CHLORIDE 0.9 % IV SOLN: Freq: Once | INTRAVENOUS | Status: AC

## 2024-05-12 MED ORDER — SODIUM CHLORIDE 0.9 % IV SOLN
150.0000 mg | Freq: Once | INTRAVENOUS | Status: AC
  Administered 2024-05-12: 150 mg via INTRAVENOUS
  Filled 2024-05-12: qty 150

## 2024-05-12 NOTE — Patient Instructions (Signed)
 Dehydration, Adult Dehydration is a condition in which there is not enough water or other fluids in the body. This happens when a person loses more fluids than they take in. Important organs cannot work right without the right amount of fluids. Any loss of fluids from the body can cause dehydration. Dehydration can be mild, worse, or very bad. It should be treated right away to keep it from getting very bad. What are the causes? Conditions that cause loss of water in the body. They include: Watery poop (diarrhea). Vomiting. Sweating a lot. Fever. Infection. Peeing (urinating) a lot. Not drinking enough fluids. Certain medicines, such as medicines that take extra fluid out of the body (diuretics). Lack of safe drinking water. Not being able to get enough water and food. What increases the risk? Having a long-term (chronic) illness that has not been treated the right way, such as: Diabetes. Heart disease. Kidney disease. Being 25 years of age or older. Having a disability. Living in a place that is high above the ground or sea (high in altitude). The thinner, drier air causes more fluid loss. Doing exercises that put stress on your body for a long time. Being active when in hot places. What are the signs or symptoms? Symptoms of dehydration depend on how bad it is. Mild or worse dehydration Thirst. Dry lips or dry mouth. Feeling dizzy or light-headed. Muscle cramps. Passing little pee or dark pee. Pee may be the color of tea. Headache. Very bad dehydration Changes in skin. Skin may: Be cold to the touch (clammy). Be blotchy or pale. Not go back to normal right after you pinch it and let it go. Little or no tears, pee, or sweat. Fast breathing. Low blood pressure. Weak pulse. Pulse that is more than 100 beats a minute when you are sitting still. Other changes, such as: Feeling very thirsty. Eyes that look hollow (sunken). Cold hands and feet. Being confused. Being very  tired (lethargic) or having trouble waking from sleep. Losing weight. Loss of consciousness. How is this treated? Treatment for this condition depends on how bad your dehydration is. Treatment should start right away. Do not wait until your condition gets very bad. Very bad dehydration is an emergency. You will need to go to a hospital. Mild or worse dehydration can be treated at home. You may be asked to: Drink more fluids. Drink an oral rehydration solution (ORS). This drink gives you the right amount of fluids, salts, and minerals (electrolytes). Very bad dehydration can be treated: With fluids through an IV tube. By correcting low levels of electrolytes in the body. By treating the problem that caused your dehydration. Follow these instructions at home: Oral rehydration solution If told by your doctor, drink an ORS: Make an ORS. Use instructions on the package. Start by drinking small amounts, about  cup (120 mL) every 5-10 minutes. Slowly drink more until you have had the amount that your doctor said to have.  Eating and drinking  Drink enough clear fluid to keep your pee pale yellow. If you were told to drink an ORS, finish the ORS first. Then, start slowly drinking other clear fluids. Drink fluids such as: Water. Do not drink only water. Doing that can make the salt (sodium) level in your body get too low. Water from ice chips you suck on. Fruit juice that you have added water to (diluted). Low-calorie sports drinks. Eat foods that have the right amounts of salts and minerals, such as bananas, oranges, potatoes,  tomatoes, or spinach. Do not drink alcohol. Avoid drinks that have caffeine or sugar. These include:: High-calorie sports drinks. Fruit juice that you did not add water to. Soda. Coffee or energy drinks. Avoid foods that are greasy or have a lot of fat or sugar. General instructions Take over-the-counter and prescription medicines only as told by your doctor. Do  not take sodium tablets. Doing that can make the salt level in your body get too high. Return to your normal activities as told by your doctor. Ask your doctor what activities are safe for you. Keep all follow-up visits. Your doctor may check and change your treatment. Contact a doctor if: You have pain in your belly (abdomen) and the pain: Gets worse. Stays in one place. You have a rash. You have a stiff neck. You get angry or annoyed more easily than normal. You are more tired or have a harder time waking than normal. You feel weak or dizzy. You feel very thirsty. Get help right away if: You have any symptoms of very bad dehydration. You vomit every time you eat or drink. Your vomiting gets worse, does not go away, or you vomit blood or green stuff. You are getting treatment, but symptoms are getting worse. You have a fever. You have a very bad headache. You have: Diarrhea that gets worse or does not go away. Blood in your poop (stool). This may cause poop to look black and tarry. No pee in 6-8 hours. Only a small amount of pee in 6-8 hours, and the pee is very dark. You have trouble breathing. These symptoms may be an emergency. Get help right away. Call 911. Do not wait to see if the symptoms will go away. Do not drive yourself to the hospital. This information is not intended to replace advice given to you by your health care provider. Make sure you discuss any questions you have with your health care provider. Document Revised: 12/15/2021 Document Reviewed: 12/15/2021 Elsevier Patient Education  2024 [employer].

## 2024-05-12 NOTE — Patient Instructions (Signed)

## 2024-05-15 ENCOUNTER — Other Ambulatory Visit (HOSPITAL_BASED_OUTPATIENT_CLINIC_OR_DEPARTMENT_OTHER): Payer: Self-pay

## 2024-05-16 ENCOUNTER — Other Ambulatory Visit (HOSPITAL_BASED_OUTPATIENT_CLINIC_OR_DEPARTMENT_OTHER): Payer: Self-pay

## 2024-05-17 ENCOUNTER — Inpatient Hospital Stay: Payer: Self-pay

## 2024-05-17 ENCOUNTER — Encounter: Payer: Self-pay | Admitting: Hematology & Oncology

## 2024-05-17 ENCOUNTER — Ambulatory Visit: Payer: Self-pay | Admitting: Hematology & Oncology

## 2024-05-17 ENCOUNTER — Other Ambulatory Visit (HOSPITAL_BASED_OUTPATIENT_CLINIC_OR_DEPARTMENT_OTHER): Payer: Self-pay

## 2024-05-17 ENCOUNTER — Inpatient Hospital Stay: Payer: Self-pay | Admitting: Medical Oncology

## 2024-05-17 ENCOUNTER — Encounter: Payer: Self-pay | Admitting: *Deleted

## 2024-05-17 ENCOUNTER — Other Ambulatory Visit: Payer: Self-pay

## 2024-05-17 VITALS — BP 112/80 | HR 91 | Temp 98.2°F | Resp 16 | Ht 64.0 in | Wt 98.0 lb

## 2024-05-17 DIAGNOSIS — Z5112 Encounter for antineoplastic immunotherapy: Secondary | ICD-10-CM | POA: Diagnosis not present

## 2024-05-17 DIAGNOSIS — D649 Anemia, unspecified: Secondary | ICD-10-CM

## 2024-05-17 DIAGNOSIS — C19 Malignant neoplasm of rectosigmoid junction: Secondary | ICD-10-CM

## 2024-05-17 DIAGNOSIS — E860 Dehydration: Secondary | ICD-10-CM

## 2024-05-17 DIAGNOSIS — G893 Neoplasm related pain (acute) (chronic): Secondary | ICD-10-CM

## 2024-05-17 LAB — CMP (CANCER CENTER ONLY)
ALT: 12 U/L (ref 0–44)
AST: 16 U/L (ref 15–41)
Albumin: 3.7 g/dL (ref 3.5–5.0)
Alkaline Phosphatase: 102 U/L (ref 38–126)
Anion gap: 12 (ref 5–15)
BUN: 6 mg/dL (ref 6–20)
CO2: 23 mmol/L (ref 22–32)
Calcium: 8.9 mg/dL (ref 8.9–10.3)
Chloride: 103 mmol/L (ref 98–111)
Creatinine: 0.46 mg/dL (ref 0.44–1.00)
GFR, Estimated: >60 mL/min (ref >60)
Glucose, Bld: 102 mg/dL — ABNORMAL HIGH (ref 70–99)
Potassium: 3.3 mmol/L — ABNORMAL LOW (ref 3.5–5.1)
Sodium: 138 mmol/L (ref 135–145)
Total Bilirubin: 0.2 mg/dL (ref 0.0–1.2)
Total Protein: 6.5 g/dL (ref 6.5–8.1)

## 2024-05-17 LAB — CBC WITH DIFFERENTIAL (CANCER CENTER ONLY)
Abs Immature Granulocytes: 0.02 K/uL (ref 0.00–0.07)
Basophils Absolute: 0 K/uL (ref 0.0–0.1)
Basophils Relative: 0 %
Eosinophils Absolute: 0.1 K/uL (ref 0.0–0.5)
Eosinophils Relative: 1 %
HCT: 30.6 % — ABNORMAL LOW (ref 36.0–46.0)
Hemoglobin: 10.2 g/dL — ABNORMAL LOW (ref 12.0–15.0)
Immature Granulocytes: 0 %
Lymphocytes Relative: 35 %
Lymphs Abs: 2.7 K/uL (ref 0.7–4.0)
MCH: 29 pg (ref 26.0–34.0)
MCHC: 33.3 g/dL (ref 30.0–36.0)
MCV: 86.9 fL (ref 80.0–100.0)
Monocytes Absolute: 0.6 K/uL (ref 0.1–1.0)
Monocytes Relative: 8 %
Neutro Abs: 4.3 K/uL (ref 1.7–7.7)
Neutrophils Relative %: 56 %
Platelet Count: 231 K/uL (ref 150–400)
RBC: 3.52 MIL/uL — ABNORMAL LOW (ref 3.87–5.11)
RDW: 21.4 % — ABNORMAL HIGH (ref 11.5–15.5)
WBC Count: 7.7 K/uL (ref 4.0–10.5)
nRBC: 0 % (ref 0.0–0.2)

## 2024-05-17 LAB — TOTAL PROTEIN, URINE DIPSTICK: Protein, ur: NEGATIVE mg/dL

## 2024-05-17 LAB — CEA (ACCESS): CEA (CHCC): 1.8 ng/mL (ref 0.00–5.00)

## 2024-05-17 LAB — IRON AND IRON BINDING CAPACITY (CC-WL,HP ONLY)
Iron: 24 ug/dL — ABNORMAL LOW (ref 28–170)
Saturation Ratios: 11 % (ref 10.4–31.8)
TIBC: 224 ug/dL — ABNORMAL LOW (ref 250–450)
UIBC: 200 ug/dL

## 2024-05-17 LAB — FERRITIN: Ferritin: 474 ng/mL — ABNORMAL HIGH (ref 11–307)

## 2024-05-17 LAB — SAMPLE TO BLOOD BANK

## 2024-05-17 LAB — PREGNANCY, URINE: Preg Test, Ur: NEGATIVE

## 2024-05-17 MED ORDER — POTASSIUM CHLORIDE CRYS ER 20 MEQ PO TBCR
20.0000 meq | EXTENDED_RELEASE_TABLET | Freq: Every day | ORAL | 0 refills | Status: AC
  Filled 2024-05-17: qty 30, 30d supply, fill #0

## 2024-05-17 NOTE — Progress Notes (Addendum)
 Hematology and Oncology Follow Up Visit  Amy Scott [id number] [birth year] 33 y.o. 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 After her last Cycle of treatment she struggled with dehydration and decreased oral intake. She also had nausea and vomiting. This has passed. She received IV fluids which did help.   She also mentions that she is having to leave tomorrow morning for a funeral in [place].   There is been no bleeding.  She is having no real problems going to the bathroom.  She has had no issues with cough or shortness of breath.  She has had no nausea or vomiting.  Her last CEA level was 1.83.  Again this has been coming down slowly but steadily..  She has had no leg swelling.  She has had no rashes.  Overall, I would have to say that her performance status is probably ECOG 2.    Wt Readings from Last 3 Encounters:  05/17/24 98 lb (44.5 kg)  05/03/24 99 lb 6.4 oz (45.1 kg)  04/19/24 95 lb (43.1 kg)     Medications:  Current Outpatient Medications:    MELATONIN PO, Take 1 tablet by mouth as needed., Disp: , Rfl:    potassium chloride  SA (KLOR-CON  M) 20 MEQ tablet, Take 1 tablet (20 mEq total) by mouth daily., Disp: 30 tablet, Rfl: 0   Acetaminophen  Extra Strength 500 MG CAPS, Take 2 tablets by mouth 3 (three) times daily as needed. DO NOT EXCEED 6 PILLS OR 3000 MG IN A 24 HOUR PERIOD., Disp: 60 capsule, Rfl: 1   amoxicillin -clavulanate (AUGMENTIN ) 875-125 MG tablet, Take 1 tablet by mouth every 12 (twelve) hours. (Patient not taking: Reported on 04/19/2024), Disp: 14 tablet, Rfl: 0   dexamethasone  (DECADRON ) 4 MG tablet, Take 2 tablets (8 mg total) by mouth daily. Take 2 tablets daily x 3 days starting the day after  chemotherapy. Take with food., Disp: 30 tablet, Rfl: 1   feeding supplement (ENSURE PLUS HIGH PROTEIN) LIQD, Take 237 mLs by mouth 2 (two) times daily between meals., Disp: 5000 mL, Rfl: 0   lidocaine -prilocaine  (EMLA ) cream, Apply 1 Application topically as needed., Disp: 30 g, Rfl: 0   loperamide  (IMODIUM ) 2 MG capsule, Take 2 tabs by mouth with first loose stool, then 1 tab with each additional loose stool as needed. Do not exceed 8 tabs in a 24-hour period, Disp: 60 capsule, Rfl: 3   LORazepam  (ATIVAN ) 0.5 MG tablet, Take 1 tablet (0.5 mg total) by mouth every 8 (eight) hours., Disp: 30 tablet, Rfl: 0   ondansetron  (ZOFRAN ) 8 MG tablet, Take 1 tablet (8 mg total) by mouth every 8 (eight) hours as needed for nausea or vomiting. Start on the third day after chemotherapy, Disp: 30 tablet, Rfl: 1   oxyCODONE  ER (XTAMPZA  ER) 18 MG C12A, Take 18 mg by mouth every 12 (twelve) hours., Disp: 60 capsule, Rfl: 0   Oxycodone  HCl 10 MG TABS, Take 1 tablet (10 mg total) by mouth every 6 (six) hours as needed., Disp: 120 tablet, Rfl: 0   polyethylene glycol powder (GLYCOLAX /MIRALAX ) 17 GM/SCOOP powder, Take 17 g dissolved in liquid by mouth 2 (two) times daily., Disp: 238 g, Rfl: 0   prochlorperazine  (COMPAZINE ) 10 MG tablet, Take  1 tablet (10 mg total) by mouth every 6 (six) hours as needed for nausea or vomiting (Nausea or vomiting)., Disp: 30 tablet, Rfl: 1   promethazine  (PHENERGAN ) 25 MG tablet, Take 1 tablet (25 mg total) by mouth every 6 (six) hours as needed for nausea or vomiting., Disp: 12 tablet, Rfl: 0  Allergies:  Allergies  Allergen Reactions   Morphine And Codeine Hives, Shortness Of Breath, Itching and Swelling    Past Medical History, Surgical history, Social history, and Family History were reviewed and updated.  Review of Systems: Review of Systems  Constitutional: Negative.   HENT:  Negative.    Eyes: Negative.   Respiratory: Negative.    Cardiovascular: Negative.    Gastrointestinal: Negative.   Endocrine: Negative.   Genitourinary: Negative.    Musculoskeletal: Negative.   Skin: Negative.   Neurological: Negative.   Hematological: Negative.   Psychiatric/Behavioral: Negative.      Physical Exam:  height is 5' 4 (1.626 m) and weight is 98 lb (44.5 kg). Her oral temperature is 98.2 F (36.8 C). Her blood pressure is 112/80 and her pulse is 91. Her respiration is 16 and oxygen saturation is 96%.   Wt Readings from Last 3 Encounters:  05/17/24 98 lb (44.5 kg)  05/03/24 99 lb 6.4 oz (45.1 kg)  04/19/24 95 lb (43.1 kg)    Physical Exam Vitals reviewed.  HENT:     Head: Normocephalic and atraumatic.  Eyes:     Pupils: Pupils are equal, round, and reactive to light.  Cardiovascular:     Rate and Rhythm: Normal rate and regular rhythm.     Heart sounds: Normal heart sounds.  Pulmonary:     Effort: Pulmonary effort is normal.     Breath sounds: Normal breath sounds.  Abdominal:     General: Bowel sounds are normal.     Palpations: Abdomen is soft.  Musculoskeletal:        General: No tenderness or deformity. Normal range of motion.     Cervical back: Normal range of motion.  Lymphadenopathy:     Cervical: No cervical adenopathy.  Skin:    General: Skin is warm and dry.     Findings: No erythema or rash.  Neurological:     Mental Status: She is alert and oriented to person, place, and time.  Psychiatric:        Behavior: Behavior normal.        Thought Content: Thought content normal.        Judgment: Judgment normal.      Lab Results  Component Value Date   WBC 7.7 05/17/2024   HGB 10.2 (L) 05/17/2024   HCT 30.6 (L) 05/17/2024   MCV 86.9 05/17/2024   PLT 231 05/17/2024     Chemistry      Component Value Date/Time   NA 138 05/17/2024 0838   K 3.3 (L) 05/17/2024 0838   CL 103 05/17/2024 0838   CO2 23 05/17/2024 0838   BUN 6 05/17/2024 0838   CREATININE 0.46 05/17/2024 0838      Component Value Date/Time   CALCIUM   8.9 05/17/2024 0838   ALKPHOS 102 05/17/2024 0838   AST 16 05/17/2024 0838   ALT 12 05/17/2024 0838   BILITOT 0.2 05/17/2024 0838     Encounter Diagnoses  Name Primary?   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 Roesler is a very charming 33 year old African American female.  She has locally advanced rectal cancer. She is being treated neoadjuvantly to shrink things down so that she will be able to undergo surgery.    Goal is to complete 6 cycles of chemotherapy. She will then get radiation therapy with low-dose chemotherapy (i.e. Xeloda).  Again our goal is ultimately to have her get GIST surgery.   Given her need for travel and trouble with GI side effects following chemotherapy we have elected to [facility] today and push till next week. That way she can get her normal chemotherapy if given today she would not be able to return for pump d/c due to travel) and we reduce the risks of her having medical complications while out of town. Please note that due to Holiday hours she will have her pump d/c prior to office closing.   She has struggled with her nutritional state. Today she has mild hypokalemia. I will send in prescription potassium supplement that she will take once daily.  RTC Monday for Cycle 6 Day 1  She will return in 2 weeks for MD, port labs, IV fluids    Lauraine CHRISTELLA Dais, PA-C 12/17/20259:52 AM

## 2024-05-17 NOTE — Progress Notes (Unsigned)
 Per patient, her Medicaid has been reinstated.   Patient came to the office today for next treatment cycle. She needs to travel out of state for a funeral, so her treatment will be delayed by one week. Rescheduled to 05/22/2024.

## 2024-05-17 NOTE — Patient Instructions (Signed)

## 2024-05-18 ENCOUNTER — Encounter: Payer: Self-pay | Admitting: Hematology & Oncology

## 2024-05-18 NOTE — Telephone Encounter (Signed)
-----   Message from [name], MD sent at 05/17/2024  5:17 PM EST ----- Please call and let her know that the iron  saturation is quite low.  Please have IV iron .  Please set this up.  Thanks.  Amy Scott

## 2024-05-18 NOTE — Telephone Encounter (Signed)
 Advised via MyChart.

## 2024-05-19 ENCOUNTER — Inpatient Hospital Stay: Payer: Self-pay

## 2024-05-19 ENCOUNTER — Other Ambulatory Visit (HOSPITAL_BASED_OUTPATIENT_CLINIC_OR_DEPARTMENT_OTHER): Payer: Self-pay

## 2024-05-22 ENCOUNTER — Telehealth: Payer: Self-pay

## 2024-05-22 ENCOUNTER — Other Ambulatory Visit: Payer: Self-pay | Admitting: *Deleted

## 2024-05-22 ENCOUNTER — Encounter: Payer: Self-pay | Admitting: Hematology & Oncology

## 2024-05-22 ENCOUNTER — Inpatient Hospital Stay: Payer: Self-pay

## 2024-05-22 ENCOUNTER — Other Ambulatory Visit (HOSPITAL_COMMUNITY): Payer: Self-pay

## 2024-05-22 ENCOUNTER — Other Ambulatory Visit (HOSPITAL_BASED_OUTPATIENT_CLINIC_OR_DEPARTMENT_OTHER): Payer: Self-pay

## 2024-05-22 ENCOUNTER — Inpatient Hospital Stay

## 2024-05-22 VITALS — BP 116/84 | HR 73 | Temp 98.4°F

## 2024-05-22 VITALS — Resp 20

## 2024-05-22 DIAGNOSIS — Z5112 Encounter for antineoplastic immunotherapy: Secondary | ICD-10-CM | POA: Diagnosis not present

## 2024-05-22 DIAGNOSIS — C19 Malignant neoplasm of rectosigmoid junction: Secondary | ICD-10-CM

## 2024-05-22 LAB — CBC WITH DIFFERENTIAL (CANCER CENTER ONLY)
Abs Immature Granulocytes: 0.01 K/uL (ref 0.00–0.07)
Basophils Absolute: 0 K/uL (ref 0.0–0.1)
Basophils Relative: 1 %
Eosinophils Absolute: 0.1 K/uL (ref 0.0–0.5)
Eosinophils Relative: 1 %
HCT: 32.8 % — ABNORMAL LOW (ref 36.0–46.0)
Hemoglobin: 10.8 g/dL — ABNORMAL LOW (ref 12.0–15.0)
Immature Granulocytes: 0 %
Lymphocytes Relative: 53 %
Lymphs Abs: 2.3 K/uL (ref 0.7–4.0)
MCH: 29 pg (ref 26.0–34.0)
MCHC: 32.9 g/dL (ref 30.0–36.0)
MCV: 88.2 fL (ref 80.0–100.0)
Monocytes Absolute: 0.4 K/uL (ref 0.1–1.0)
Monocytes Relative: 10 %
Neutro Abs: 1.6 K/uL — ABNORMAL LOW (ref 1.7–7.7)
Neutrophils Relative %: 35 %
Platelet Count: 281 K/uL (ref 150–400)
RBC: 3.72 MIL/uL — ABNORMAL LOW (ref 3.87–5.11)
RDW: 20.9 % — ABNORMAL HIGH (ref 11.5–15.5)
WBC Count: 4.4 K/uL (ref 4.0–10.5)
nRBC: 0 % (ref 0.0–0.2)

## 2024-05-22 LAB — CMP (CANCER CENTER ONLY)
ALT: 10 U/L (ref 0–44)
AST: 16 U/L (ref 15–41)
Albumin: 3.6 g/dL (ref 3.5–5.0)
Alkaline Phosphatase: 119 U/L (ref 38–126)
Anion gap: 10 (ref 5–15)
BUN: 7 mg/dL (ref 6–20)
CO2: 24 mmol/L (ref 22–32)
Calcium: 8.9 mg/dL (ref 8.9–10.3)
Chloride: 105 mmol/L (ref 98–111)
Creatinine: 0.44 mg/dL (ref 0.44–1.00)
GFR, Estimated: >60 mL/min
Glucose, Bld: 91 mg/dL (ref 70–99)
Potassium: 3.8 mmol/L (ref 3.5–5.1)
Sodium: 139 mmol/L (ref 135–145)
Total Bilirubin: <0.2 mg/dL (ref 0.0–1.2)
Total Protein: 6.5 g/dL (ref 6.5–8.1)

## 2024-05-22 LAB — PREGNANCY, URINE: Preg Test, Ur: NEGATIVE

## 2024-05-22 MED ORDER — SODIUM CHLORIDE 0.9 % IV SOLN
5.0000 mg/kg | Freq: Once | INTRAVENOUS | Status: AC
  Administered 2024-05-22: 225 mg via INTRAVENOUS
  Filled 2024-05-22: qty 9

## 2024-05-22 MED ORDER — FLUOROURACIL CHEMO INJECTION 2.5 GM/50ML
400.0000 mg/m2 | Freq: Once | INTRAVENOUS | Status: AC
  Administered 2024-05-22: 600 mg via INTRAVENOUS
  Filled 2024-05-22: qty 12

## 2024-05-22 MED ORDER — GABAPENTIN 300 MG PO CAPS
300.0000 mg | ORAL_CAPSULE | Freq: Three times a day (TID) | ORAL | 0 refills | Status: AC
  Filled 2024-05-22 (×2): qty 90, 30d supply, fill #0

## 2024-05-22 MED ORDER — PALONOSETRON HCL INJECTION 0.25 MG/5ML
0.2500 mg | Freq: Once | INTRAVENOUS | Status: AC
  Administered 2024-05-22: 0.25 mg via INTRAVENOUS
  Filled 2024-05-22: qty 5

## 2024-05-22 MED ORDER — DEXTROSE 5 % IV SOLN: INTRAVENOUS | Status: AC

## 2024-05-22 MED ORDER — ATROPINE SULFATE 1 MG/ML IV SOLN
0.5000 mg | Freq: Once | INTRAVENOUS | Status: AC | PRN
  Administered 2024-05-22: 0.5 mg via INTRAVENOUS
  Filled 2024-05-22: qty 1

## 2024-05-22 MED ORDER — SODIUM CHLORIDE 0.9 % IV SOLN
400.0000 mg/m2 | Freq: Once | INTRAVENOUS | Status: AC
  Administered 2024-05-22: 584 mg via INTRAVENOUS
  Filled 2024-05-22: qty 29.2

## 2024-05-22 MED ORDER — SODIUM CHLORIDE 0.9 % IV SOLN
150.0000 mg | Freq: Once | INTRAVENOUS | Status: AC
  Administered 2024-05-22: 150 mg via INTRAVENOUS
  Filled 2024-05-22: qty 150

## 2024-05-22 MED ORDER — FERRIC DERISOMALTOSE(ONE DOSE) 1000 MG/10ML IV SOLN
1000.0000 mg | Freq: Once | INTRAVENOUS | Status: AC
  Administered 2024-05-22: 1000 mg via INTRAVENOUS
  Filled 2024-05-22: qty 10

## 2024-05-22 MED ORDER — SODIUM CHLORIDE 0.9 % IV SOLN
2000.0000 mg/m2 | INTRAVENOUS | Status: AC
  Administered 2024-05-22: 2900 mg via INTRAVENOUS
  Filled 2024-05-22: qty 58

## 2024-05-22 MED ORDER — SODIUM CHLORIDE 0.9 % IV SOLN
180.0000 mg/m2 | Freq: Once | INTRAVENOUS | Status: AC
  Administered 2024-05-22: 260 mg via INTRAVENOUS
  Filled 2024-05-22: qty 5

## 2024-05-22 MED ORDER — OXALIPLATIN CHEMO INJECTION 100 MG/20ML
85.0000 mg/m2 | Freq: Once | INTRAVENOUS | Status: AC
  Administered 2024-05-22: 125 mg via INTRAVENOUS
  Filled 2024-05-22: qty 25

## 2024-05-22 MED ORDER — OXYCODONE HCL 10 MG PO TABS
20.0000 mg | ORAL_TABLET | Freq: Four times a day (QID) | ORAL | 0 refills | Status: DC | PRN
  Filled 2024-05-22 (×2): qty 120, 15d supply, fill #0

## 2024-05-22 MED ORDER — SODIUM CHLORIDE 0.9 % IV SOLN: INTRAVENOUS | Status: AC

## 2024-05-22 MED ORDER — DEXAMETHASONE SOD PHOSPHATE PF 10 MG/ML IJ SOLN
10.0000 mg | Freq: Once | INTRAMUSCULAR | Status: AC
  Administered 2024-05-22: 10 mg via INTRAVENOUS

## 2024-05-22 MED ORDER — DEXAMETHASONE 4 MG PO TABS
8.0000 mg | ORAL_TABLET | Freq: Every day | ORAL | 1 refills | Status: AC
  Filled 2024-05-22: qty 30, 15d supply, fill #0

## 2024-05-22 NOTE — Telephone Encounter (Signed)
 Oral Oncology Patient Advocate Encounter   Received notification that prior authorization for Oxycodone  HCl 10 MG TABS  is required.   PA submitted on 05/22/2024 [facility] Status is pending      Charlott Hamilton,  CPhT-Adv  she/her/hers [facility]  [facility] Pharmacy Technician Patient Advocate Special[facility] Phone: [phone]  Fax: [phone] [name]

## 2024-05-22 NOTE — Patient Instructions (Signed)

## 2024-05-22 NOTE — Addendum Note (Signed)
 Addended by: JEWEL SHANDA SAUNDERS on: 05/22/2024 08:51 AM   Modules accepted: Orders

## 2024-05-22 NOTE — Telephone Encounter (Signed)
 Oral Oncology Patient Advocate Encounter   Received notification that prior authorization for oxyCODONE  ER (XTAMPZA  ER)  is required.   PA submitted on 05/22/2024 [id number] Status is pending      Charlott Hamilton,  CPhT-Adv  she/her/hers [facility]  [facility] Pharmacy Technician Patient Advocate Specialist III [facility] Phone: [phone]  Fax: [phone] [email or web address]

## 2024-05-22 NOTE — Telephone Encounter (Signed)
 Oral Oncology Patient Advocate Encounter  Prior Authorization for Oxycodone  HCl 10 MG TABS  has been approved.    PA# [id number] Effective dates: 05/22/2024 through 11/18/2024  Patient has been notified via MyChart  Charlott Hamilton,  CPhT-Adv  she/her/hers [facility]  [facility] Pharmacy Technician Patient Advocate Specialist III [facility] Phone: [phone]  Fax: [phone] [email or web address]

## 2024-05-23 ENCOUNTER — Other Ambulatory Visit: Payer: Self-pay | Admitting: *Deleted

## 2024-05-23 ENCOUNTER — Other Ambulatory Visit (HOSPITAL_COMMUNITY): Payer: Self-pay

## 2024-05-23 ENCOUNTER — Encounter: Payer: Self-pay | Admitting: Hematology & Oncology

## 2024-05-23 ENCOUNTER — Telehealth: Payer: Self-pay

## 2024-05-23 MED ORDER — OXYCODONE HCL ER 15 MG PO T12A: 15.0000 mg | EXTENDED_RELEASE_TABLET | Freq: Two times a day (BID) | ORAL | 0 refills | Status: DC

## 2024-05-23 NOTE — Telephone Encounter (Signed)
 Oral Oncology Patient Advocate Encounter   Received notification that prior authorization for oxyCODONE  (OXYCONTIN ) 15 mg 12 hr tablet  is required.   PA submitted on 05/23/2024 [facility] [id number] Status is pending      Charlott Hamilton,  CPhT-Adv  she/her/hers [facility]  [facility] Pharmacy Technician Patient Advocate Specialist [facility] Phone: [phone]  Fax: [phone] [email or web address]

## 2024-05-23 NOTE — Telephone Encounter (Signed)
 Oral Oncology Patient Advocate Encounter  Prior Authorization for oxyCODONE  (OXYCONTIN ) 15 mg 12 hr tablet  has been approved.    PA# [id number] Effective dates: 05/23/2025 through 08/21/2024  Patient has been notified via MyChart   Charlott Hamilton,  CPhT-Adv  she/her/hers [facility]  [facility] Pharmacy Technician Patient Advocate Specialist III [facility] and [place] Phone: [phone]  Fax: [phone] [email or web address]

## 2024-05-23 NOTE — Telephone Encounter (Signed)
 Oral Oncology Patient Advocate Encounter  Received notification that the request for prior authorization for oxyCODONE  ER (XTAMPZA  ER)  has been denied due to : Denial Reason: Drug Exclusion Medication not covered under member's benefit plan; administratively excluded from formulary.    Patient has been notified via MyChart   Nursing staff has been notified of [place] Medicaid's formulary via Email  Charlott Hamilton,  CPhT-Adv  she/her/hers [facility]  [facility] Pharmacy Technician Patient Advocate Specialist III [facility] Phone: [phone]  Fax: [phone] [email or web address]

## 2024-05-24 ENCOUNTER — Inpatient Hospital Stay

## 2024-05-24 ENCOUNTER — Inpatient Hospital Stay: Payer: MEDICAID

## 2024-05-24 VITALS — BP 143/91 | HR 58 | Temp 98.0°F | Resp 18

## 2024-05-24 DIAGNOSIS — Z5112 Encounter for antineoplastic immunotherapy: Secondary | ICD-10-CM | POA: Diagnosis not present

## 2024-05-24 DIAGNOSIS — C19 Malignant neoplasm of rectosigmoid junction: Secondary | ICD-10-CM

## 2024-05-24 MED ORDER — SODIUM CHLORIDE 0.9 % IV SOLN: INTRAVENOUS | Status: AC

## 2024-05-24 MED ORDER — PEGFILGRASTIM-CBQV 6 MG/0.6ML ~~LOC~~ SOSY
6.0000 mg | PREFILLED_SYRINGE | Freq: Once | SUBCUTANEOUS | Status: AC
  Administered 2024-05-24: 6 mg via SUBCUTANEOUS
  Filled 2024-05-24: qty 0.6

## 2024-05-24 NOTE — Patient Instructions (Signed)

## 2024-05-29 ENCOUNTER — Inpatient Hospital Stay

## 2024-05-29 ENCOUNTER — Other Ambulatory Visit (HOSPITAL_BASED_OUTPATIENT_CLINIC_OR_DEPARTMENT_OTHER): Payer: Self-pay

## 2024-05-31 ENCOUNTER — Inpatient Hospital Stay

## 2024-05-31 ENCOUNTER — Encounter: Payer: Self-pay | Admitting: *Deleted

## 2024-05-31 ENCOUNTER — Encounter: Payer: Self-pay | Admitting: Hematology & Oncology

## 2024-05-31 ENCOUNTER — Inpatient Hospital Stay (HOSPITAL_BASED_OUTPATIENT_CLINIC_OR_DEPARTMENT_OTHER): Admitting: Hematology & Oncology

## 2024-05-31 VITALS — BP 113/80 | HR 94 | Temp 98.3°F | Resp 18 | Ht 64.0 in | Wt 98.1 lb

## 2024-05-31 DIAGNOSIS — C19 Malignant neoplasm of rectosigmoid junction: Secondary | ICD-10-CM | POA: Diagnosis not present

## 2024-05-31 DIAGNOSIS — Z5112 Encounter for antineoplastic immunotherapy: Secondary | ICD-10-CM | POA: Diagnosis not present

## 2024-05-31 LAB — CBC WITH DIFFERENTIAL (CANCER CENTER ONLY)
Abs Immature Granulocytes: 0.19 K/uL — ABNORMAL HIGH (ref 0.00–0.07)
Basophils Absolute: 0.1 K/uL (ref 0.0–0.1)
Basophils Relative: 1 %
Eosinophils Absolute: 0.1 K/uL (ref 0.0–0.5)
Eosinophils Relative: 0 %
HCT: 35.1 % — ABNORMAL LOW (ref 36.0–46.0)
Hemoglobin: 11.6 g/dL — ABNORMAL LOW (ref 12.0–15.0)
Immature Granulocytes: 1 %
Lymphocytes Relative: 21 %
Lymphs Abs: 2.9 K/uL (ref 0.7–4.0)
MCH: 29.3 pg (ref 26.0–34.0)
MCHC: 33 g/dL (ref 30.0–36.0)
MCV: 88.6 fL (ref 80.0–100.0)
Monocytes Absolute: 1.6 K/uL — ABNORMAL HIGH (ref 0.1–1.0)
Monocytes Relative: 11 %
Neutro Abs: 8.9 K/uL — ABNORMAL HIGH (ref 1.7–7.7)
Neutrophils Relative %: 66 %
Platelet Count: 147 K/uL — ABNORMAL LOW (ref 150–400)
RBC: 3.96 MIL/uL (ref 3.87–5.11)
RDW: 20 % — ABNORMAL HIGH (ref 11.5–15.5)
WBC Count: 13.7 K/uL — ABNORMAL HIGH (ref 4.0–10.5)
nRBC: 0 % (ref 0.0–0.2)

## 2024-05-31 LAB — CMP (CANCER CENTER ONLY)
ALT: 43 U/L (ref 0–44)
AST: 31 U/L (ref 15–41)
Albumin: 3.9 g/dL (ref 3.5–5.0)
Alkaline Phosphatase: 154 U/L — ABNORMAL HIGH (ref 38–126)
Anion gap: 11 (ref 5–15)
BUN: 7 mg/dL (ref 6–20)
CO2: 25 mmol/L (ref 22–32)
Calcium: 8.9 mg/dL (ref 8.9–10.3)
Chloride: 103 mmol/L (ref 98–111)
Creatinine: 0.44 mg/dL (ref 0.44–1.00)
GFR, Estimated: >60 mL/min
Glucose, Bld: 101 mg/dL — ABNORMAL HIGH (ref 70–99)
Potassium: 3.8 mmol/L (ref 3.5–5.1)
Sodium: 139 mmol/L (ref 135–145)
Total Bilirubin: <0.2 mg/dL (ref 0.0–1.2)
Total Protein: 6.7 g/dL (ref 6.5–8.1)

## 2024-05-31 LAB — CEA (ACCESS): CEA (CHCC): 2.41 ng/mL (ref 0.00–5.00)

## 2024-05-31 LAB — PREGNANCY, URINE: Preg Test, Ur: NEGATIVE

## 2024-05-31 NOTE — Patient Instructions (Signed)

## 2024-05-31 NOTE — Progress Notes (Signed)
 " Hematology and Oncology Follow Up Visit  Amy Scott [id number] Dec 04, 1990 33 y.o. 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  She actually looks quite good.  She completed all of her full dose neoadjuvant chemotherapy.  We really need to set her up with a MRI of the pelvis to see how well things have worked.  She has had no problems going to the bathroom.  She has had no bleeding.  She is doing well with pain.  She has had no cough or shortness of breath.  There has been no leg swelling.  She has had no rashes.  There is been no obvious fever.  She has had no headache.  Her last CEA was 1.8.  She did have a nice Christmas.  She will have a quiet New Year's.  Her appetite is better.  I think she may be gaining a little bit of weight.  Overall, I would have to say that her performance status is probably ECOG 1.      Wt Readings from Last 3 Encounters:  05/31/24 98 lb 1.9 oz (44.5 kg)  05/17/24 98 lb (44.5 kg)  05/03/24 99 lb 6.4 oz (45.1 kg)     Medications:  Current Outpatient Medications:    dexamethasone  (DECADRON ) 4 MG tablet, Take 2 tablets (8 mg total) by mouth daily for 3 days starting the day after chemotherapy. Take with food., Disp: 30 tablet, Rfl: 1   feeding supplement (ENSURE PLUS HIGH PROTEIN) LIQD, Take 237 mLs by mouth 2 (two) times daily between meals., Disp: 5000 mL, Rfl: 0   gabapentin  (NEURONTIN ) 300 MG capsule, Take 1 capsule (300 mg total) by mouth 3 (three) times daily., Disp: 90 capsule, Rfl: 0   lidocaine -prilocaine  (EMLA ) cream, Apply 1 Application topically as needed., Disp: 30 g, Rfl: 0   loperamide  (IMODIUM ) 2 MG capsule, Take 2 tabs by mouth with first loose stool, then 1 tab with each additional loose stool as needed. Do not  exceed 8 tabs in a 24-hour period, Disp: 60 capsule, Rfl: 3   LORazepam  (ATIVAN ) 0.5 MG tablet, Take 1 tablet (0.5 mg total) by mouth every 8 (eight) hours., Disp: 30 tablet, Rfl: 0   MELATONIN PO, Take 1 tablet by mouth as needed., Disp: , Rfl:    ondansetron  (ZOFRAN ) 8 MG tablet, Take 1 tablet (8 mg total) by mouth every 8 (eight) hours as needed for nausea or vomiting. Start on the third day after chemotherapy, Disp: 30 tablet, Rfl: 1   oxyCODONE  (OXYCONTIN ) 15 mg 12 hr tablet, Take 1 tablet (15 mg total) by mouth every 12 (twelve) hours., Disp: 60 tablet, Rfl: 0   oxyCODONE  ER (XTAMPZA  ER) 18 MG C12A, Take 18 mg by mouth every 12 (twelve) hours., Disp: 60 capsule, Rfl: 0   Oxycodone  HCl 10 MG TABS, Take 2 tablets (20 mg total) by mouth every 6 (six) hours as needed., Disp: 120 tablet, Rfl: 0   polyethylene glycol powder (GLYCOLAX /MIRALAX ) 17 GM/SCOOP powder, Take 17 g dissolved in liquid by mouth 2 (two) times daily., Disp: 238 g, Rfl: 0   prochlorperazine  (COMPAZINE ) 10 MG tablet, Take 1 tablet (10 mg total) by mouth every 6 (six) hours as needed for nausea or vomiting (Nausea or vomiting)., Disp: 30 tablet, Rfl: 1   promethazine  (  PHENERGAN ) 25 MG tablet, Take 1 tablet (25 mg total) by mouth every 6 (six) hours as needed for nausea or vomiting., Disp: 12 tablet, Rfl: 0   Acetaminophen  Extra Strength 500 MG CAPS, Take 2 tablets by mouth 3 (three) times daily as needed. DO NOT EXCEED 6 PILLS OR 3000 MG IN A 24 HOUR PERIOD. (Patient not taking: Reported on 05/31/2024), Disp: 60 capsule, Rfl: 1   amoxicillin -clavulanate (AUGMENTIN ) 875-125 MG tablet, Take 1 tablet by mouth every 12 (twelve) hours. (Patient not taking: Reported on 05/31/2024), Disp: 14 tablet, Rfl: 0   potassium chloride  SA (KLOR-CON  M) 20 MEQ tablet, Take 1 tablet (20 mEq total) by mouth daily. (Patient not taking: Reported on 05/31/2024), Disp: 30 tablet, Rfl: 0 No current facility-administered medications for this  visit.  Facility-Administered Medications Ordered in Other Visits:    0.9 %  sodium chloride  infusion, , Intravenous, Continuous, Kaylie Ritter, Maude SAUNDERS, MD, Last Rate: 10 mL/hr at 05/22/24 0939, New Bag at 05/22/24 0939   0.9 %  sodium chloride  infusion, , Intravenous, Continuous, Alondria Mousseau, Maude SAUNDERS, MD, Stopped at 05/22/24 1040   dextrose  5 % solution, , Intravenous, Continuous, Fionnuala Hemmerich, Maude SAUNDERS, MD, Last Rate: 10 mL/hr at 05/22/24 1044, New Bag at 05/22/24 1044  Allergies:  Allergies  Allergen Reactions   Morphine And Codeine Hives, Shortness Of Breath, Itching and Swelling    Past Medical History, Surgical history, Social history, and Family History were reviewed and updated.  Review of Systems: Review of Systems  Constitutional: Negative.   HENT:  Negative.    Eyes: Negative.   Respiratory: Negative.    Cardiovascular: Negative.   Gastrointestinal: Negative.   Endocrine: Negative.   Genitourinary: Negative.    Musculoskeletal: Negative.   Skin: Negative.   Neurological: Negative.   Hematological: Negative.   Psychiatric/Behavioral: Negative.      Physical Exam:  height is 5' 4 (1.626 m) and weight is 98 lb 1.9 oz (44.5 kg). Her oral temperature is 98.3 F (36.8 C). Her blood pressure is 113/80 and her pulse is 94. Her respiration is 18 and oxygen saturation is 98%.   Wt Readings from Last 3 Encounters:  05/31/24 98 lb 1.9 oz (44.5 kg)  05/17/24 98 lb (44.5 kg)  05/03/24 99 lb 6.4 oz (45.1 kg)    Physical Exam Vitals reviewed.  HENT:     Head: Normocephalic and atraumatic.  Eyes:     Pupils: Pupils are equal, round, and reactive to light.  Cardiovascular:     Rate and Rhythm: Normal rate and regular rhythm.     Heart sounds: Normal heart sounds.  Pulmonary:     Effort: Pulmonary effort is normal.     Breath sounds: Normal breath sounds.  Abdominal:     General: Bowel sounds are normal.     Palpations: Abdomen is soft.  Musculoskeletal:        General: No  tenderness or deformity. Normal range of motion.     Cervical back: Normal range of motion.  Lymphadenopathy:     Cervical: No cervical adenopathy.  Skin:    General: Skin is warm and dry.     Findings: No erythema or rash.  Neurological:     Mental Status: She is alert and oriented to person, place, and time.  Psychiatric:        Behavior: Behavior normal.        Thought Content: Thought content normal.        Judgment: Judgment normal.  Lab Results  Component Value Date   WBC 13.7 (H) 05/31/2024   HGB 11.6 (L) 05/31/2024   HCT 35.1 (L) 05/31/2024   MCV 88.6 05/31/2024   PLT 147 (L) 05/31/2024     Chemistry      Component Value Date/Time   NA 139 05/31/2024 0805   K 3.8 05/31/2024 0805   CL 103 05/31/2024 0805   CO2 25 05/31/2024 0805   BUN 7 05/31/2024 0805   CREATININE 0.44 05/31/2024 0805      Component Value Date/Time   CALCIUM  8.9 05/31/2024 0805   ALKPHOS 154 (H) 05/31/2024 0805   AST 31 05/31/2024 0805   ALT 43 05/31/2024 0805   BILITOT <0.2 05/31/2024 0805      Impression and Plan: Ms. Seeney is a very charming 33 year old African American female.  She has locally advanced rectal cancer. She is being treated neoadjuvantly to shrink things down so that she will be able to undergo surgery.    Again, she has had her neoadjuvant chemotherapy.  I would like to get a MRI of the pelvis to see how everything looks.  Then, we will then plan for chemoradiation therapy.  Hopefully, if she has a good response with that then we can see about surgery.  Our goal is to try to cure this.  I know this has been quite difficult given that she has had locally advanced disease.  Again, if we can just try to get some good tumor reduction, then we should be able to hopefully have surgery to resect the tumor and hopefully not leave her with a permanent colostomy.  Will see about getting the MRI in a week or so.  I will then plan to see her back and then hopefully move her over  to radiation therapy.  I would then consider using Xeloda with radiation.    [name], MD 12/31/202510:05 AM "

## 2024-05-31 NOTE — Progress Notes (Signed)
 Patient has completed her six scheduled cycles. She will now need post treatment scans with plan to move onto ChemoRT followed by surgery.   MRI scheduled for 06/06/2024  Oncology Nurse Navigator Documentation     05/31/2024   10:00 AM  Oncology Nurse Navigator Flowsheets  Navigator Follow Up Date: 06/06/2024  Navigator Follow Up Reason: Scan Review  Navigator Location [facility]  Navigator Encounter Type Follow-up Appt  Patient Visit Type MedOnc  Treatment Phase Active Tx  Barriers/Navigation Needs No Barriers At This Time  Interventions Psycho-Social Support  Acuity Level 1-No Barriers  Support Groups/Services Friends and Family  Time Spent with Patient 15

## 2024-06-04 ENCOUNTER — Other Ambulatory Visit: Payer: Self-pay

## 2024-06-06 ENCOUNTER — Ambulatory Visit (HOSPITAL_COMMUNITY)
Admission: RE | Admit: 2024-06-06 | Discharge: 2024-06-06 | Disposition: A | Discharge status: Home / Self Care | Source: Ambulatory Visit | Attending: Hematology & Oncology | Admitting: Hematology & Oncology

## 2024-06-06 ENCOUNTER — Encounter: Payer: Self-pay | Admitting: Hematology & Oncology

## 2024-06-06 DIAGNOSIS — C19 Malignant neoplasm of rectosigmoid junction: Secondary | ICD-10-CM | POA: Insufficient documentation

## 2024-06-07 ENCOUNTER — Telehealth: Payer: Self-pay | Admitting: Dietician

## 2024-06-07 ENCOUNTER — Encounter: Payer: Self-pay | Admitting: *Deleted

## 2024-06-07 ENCOUNTER — Inpatient Hospital Stay: Payer: Self-pay | Admitting: Dietician

## 2024-06-07 DIAGNOSIS — C19 Malignant neoplasm of rectosigmoid junction: Secondary | ICD-10-CM

## 2024-06-07 NOTE — Progress Notes (Signed)
 Reviewed MRI with Dr Timmy who believes that it shows treatment response. He would like patient to be seen for consideration of radiation. He will speak to Dr Maritza personally. Referral order entered.   Oncology Nurse Navigator Documentation     06/07/2024   10:15 AM  Oncology Nurse Navigator Flowsheets  Navigator Follow Up Date: 06/23/2024  Navigator Follow Up Reason: Follow-up Appointment  Navigator Location [facility]  Navigator Encounter Type Scan Review  Patient Visit Type MedOnc  Treatment Phase Active Tx  Barriers/Navigation Needs No Barriers At This Time  Interventions Referrals  Acuity Level 1-No Barriers  Referrals Radiation Oncology  Support Groups/Services Friends and Family  Time Spent with Patient 15

## 2024-06-07 NOTE — Telephone Encounter (Signed)
Attempted to reach patient for a scheduled remote nutrition consult. Provided my cell# on voice mail to return call for her follow up nutrition consult.  Cyndi Helder Crisafulli, RDN, LDN Registered Dietitian, [facility] Part Time Remote (Usual office hours: Tuesday-Thursday) Cell: [phone]    

## 2024-06-10 ENCOUNTER — Other Ambulatory Visit: Payer: Self-pay

## 2024-06-13 ENCOUNTER — Telehealth: Payer: Self-pay | Admitting: Radiation Oncology

## 2024-06-13 NOTE — Telephone Encounter (Signed)
 Per Dr. Maritza, appts to be moved out. Pt agreeable to consult 1/16@10 :30am and IV/SIM 1/19@8 /8:30am. All treatment team members notified.

## 2024-06-15 ENCOUNTER — Ambulatory Visit: Payer: MEDICAID | Admitting: Radiation Oncology

## 2024-06-15 ENCOUNTER — Ambulatory Visit: Payer: MEDICAID

## 2024-06-16 ENCOUNTER — Ambulatory Visit
Admission: RE | Admit: 2024-06-16 | Discharge: 2024-06-16 | Disposition: A | Discharge status: Home / Self Care | Source: Ambulatory Visit | Attending: Radiation Oncology | Admitting: Radiation Oncology

## 2024-06-16 ENCOUNTER — Encounter: Payer: Self-pay | Admitting: Hematology & Oncology

## 2024-06-16 ENCOUNTER — Ambulatory Visit
Admission: RE | Admit: 2024-06-16 | Discharge: 2024-06-16 | Disposition: A | Discharge status: Home / Self Care | Payer: MEDICAID | Source: Ambulatory Visit | Attending: Radiation Oncology | Admitting: Radiation Oncology

## 2024-06-16 ENCOUNTER — Encounter: Payer: Self-pay | Admitting: Radiation Oncology

## 2024-06-16 ENCOUNTER — Inpatient Hospital Stay: Attending: Medical Oncology

## 2024-06-16 VITALS — BP 106/70 | HR 88 | Temp 97.9°F | Resp 17 | Ht 64.0 in | Wt 102.0 lb

## 2024-06-16 DIAGNOSIS — Z9049 Acquired absence of other specified parts of digestive tract: Secondary | ICD-10-CM | POA: Insufficient documentation

## 2024-06-16 DIAGNOSIS — Z923 Personal history of irradiation: Secondary | ICD-10-CM | POA: Insufficient documentation

## 2024-06-16 DIAGNOSIS — C19 Malignant neoplasm of rectosigmoid junction: Secondary | ICD-10-CM | POA: Insufficient documentation

## 2024-06-16 DIAGNOSIS — Z87891 Personal history of nicotine dependence: Secondary | ICD-10-CM | POA: Insufficient documentation

## 2024-06-16 DIAGNOSIS — Z79899 Other long term (current) drug therapy: Secondary | ICD-10-CM | POA: Insufficient documentation

## 2024-06-16 NOTE — Progress Notes (Signed)
 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    Amy Scott presented here today for a telephone follow up new appointment with Dr. Maritza. Patient had a sigmoidoscopy on 02/03/24 with Dr.Toth.   Biopsies of (if applicable) revealed:    Past/Anticipated interventions by surgeon, if any:   Past/Anticipated interventions by medical oncology, if any: Scheduled to see Dr. Timmy on 02/11/24.   Weight changes, if any: Denies   Bowel/Bladder complaints, if any: Denies   Nausea / Vomiting, if any: Denies   Pain issues, if any:She reports 5/10 discomfort to rectum.   Any blood per rectum: Denies   SAFETY ISSUES: Prior radiation? No Pacemaker/ICD? No Possible current pregnancy? No Is the patient on methotrexate? No   Current Complaints/Details:She is scheduled for staging MRI on 02/14/2024.  She is scheduled next week for simulation.

## 2024-06-16 NOTE — Progress Notes (Addendum)
 " Radiation Oncology         [phone] ________________________________  Name: Amy Scott        MRN: [id number]  Date of Service: 06/16/2024 DOB: [birth year]  RR:[facility]  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 female seen in consultation for radiation therapy.  She was first seen in September 2025 at initial diagnosis. She is now s/p 6 cycles of FOLFIRINOX w/ bevacizumab , last dose 05/22/24. She reports her pain is still present and might be slightly worse, with persistent rectal and pelvic discomfort making it hard for her to sit and lie comfortably.   She initially presented to the ER in early September with worsening abdominal pain in setting of a month of abdominal and R flank pain. CT renal stone demonstrated circumferential bowel wall thickening and extensive perirectal inflammatory stranding involving the mide and high rectal, with associated inflammatory changes resulting in a partial large bowel obstruction. Extensive perirectal adenopathy was also noted.   Given concern for malignancy, she underwent flex sigmoidoscopy w/ Dr. Rollin on 02/03/24. This revealed a near-obstructing rectal mass consistent with malignancy. Biopsy confirmed invasive moderately differentiated  adenocarcinoma. Subsequent staging included CT CAP which showed no evidence of metastatic disease, specifically no thoracic or hepatic metastases, though the mass at the rectosigmoid junction w/ pelvic adenopathy was noted. MRI pelvis was terminated early due to difficulty tolerating rectal gel but demonstrated a rectosigmoid primary tumor w/ probable extramural extension into the R mesorectum and sigmoid mesocolon along w/ pelvic adenopathy suspicious for nodal metastases.   She was evaluated by general surgery and no intervention pursued. She was referred for neoadjuvant therapy. Her partial obstruction was managed with twice daily Miralax . She continues using Miralax  today and has medicine incase she has any episodes of diarrhea. She completed neoadjuvant systemic therapy in December and restaging MRI pelvis from 10 days ago demonstrates persistent T3 disease with decrease in size and number of previously suspicious lymph nodes. CEA has gone down from 15.49 at diagnosis down to nadir of 1.8 on 05/17/24, most recently 2.41 on 05/31/24.  She presents today to discuss radiation.   PREVIOUS RADIATION THERAPY: No  AUTOIMMUNE DISEASE: No  MEDICAL DEVICES: No  PREGNANCY: No   PAST MEDICAL HISTORY:  Past Medical History:  Diagnosis Date   Cancer (HCC)    Headache(784.0)    No pertinent past medical history        PAST SURGICAL HISTORY: Past Surgical History:  Procedure Laterality Date   APPENDECTOMY     FLEXIBLE SIGMOIDOSCOPY N/A 02/03/2024   Procedure: KINGSTON SIDE;  Surgeon: Rollin Dover, MD;  Location: [facility];  Service: Gastroenterology;  Laterality: N/A;  rectal bleeding/abnormal CT scan.   IR IMAGING GUIDED PORT INSERTION  02/22/2024   MOUTH SURGERY  FAMILY HISTORY: History reviewed. No pertinent family history.   SOCIAL HISTORY:  reports that she quit smoking about 16 years ago. Her smoking use included cigarettes. She has never used smokeless tobacco. She  reports current drug use. Drug: Marijuana. She reports that she does not drink alcohol.   ALLERGIES: Morphine and codeine   MEDICATIONS:  Current Outpatient Medications  Medication Sig Dispense Refill   dexamethasone  (DECADRON ) 4 MG tablet Take 2 tablets (8 mg total) by mouth daily for 3 days starting the day after chemotherapy. Take with food. 30 tablet 1   feeding supplement (ENSURE PLUS HIGH PROTEIN) LIQD Take 237 mLs by mouth 2 (two) times daily between meals. 5000 mL 0   gabapentin  (NEURONTIN ) 300 MG capsule Take 1 capsule (300 mg total) by mouth 3 (three) times daily. 90 capsule 0   lidocaine -prilocaine  (EMLA ) cream Apply 1 Application topically as needed. 30 g 0   loperamide  (IMODIUM ) 2 MG capsule Take 2 tabs by mouth with first loose stool, then 1 tab with each additional loose stool as needed. Do not exceed 8 tabs in a 24-hour period 60 capsule 3   LORazepam  (ATIVAN ) 0.5 MG tablet Take 1 tablet (0.5 mg total) by mouth every 8 (eight) hours. 30 tablet 0   MELATONIN PO Take 1 tablet by mouth as needed.     ondansetron  (ZOFRAN ) 8 MG tablet Take 1 tablet (8 mg total) by mouth every 8 (eight) hours as needed for nausea or vomiting. Start on the third day after chemotherapy 30 tablet 1   oxyCODONE  (OXYCONTIN ) 15 mg 12 hr tablet Take 1 tablet (15 mg total) by mouth every 12 (twelve) hours. 60 tablet 0   oxyCODONE  ER (XTAMPZA  ER) 18 MG C12A Take 18 mg by mouth every 12 (twelve) hours. 60 capsule 0   Oxycodone  HCl 10 MG TABS Take 2 tablets (20 mg total) by mouth every 6 (six) hours as needed. 120 tablet 0   polyethylene glycol powder (GLYCOLAX /MIRALAX ) 17 GM/SCOOP powder Take 17 g dissolved in liquid by mouth 2 (two) times daily. 238 g 0   prochlorperazine  (COMPAZINE ) 10 MG tablet Take 1 tablet (10 mg total) by mouth every 6 (six) hours as needed for nausea or vomiting (Nausea or vomiting). 30 tablet 1   promethazine  (PHENERGAN ) 25 MG tablet Take 1 tablet (25 mg total) by mouth every 6 (six)  hours as needed for nausea or vomiting. 12 tablet 0   Acetaminophen  Extra Strength 500 MG CAPS Take 2 tablets by mouth 3 (three) times daily as needed. DO NOT EXCEED 6 PILLS OR 3000 MG IN A 24 HOUR PERIOD. (Patient not taking: Reported on 06/16/2024) 60 capsule 1   amoxicillin -clavulanate (AUGMENTIN ) 875-125 MG tablet Take 1 tablet by mouth every 12 (twelve) hours. (Patient not taking: Reported on 06/16/2024) 14 tablet 0   potassium chloride  SA (KLOR-CON  M) 20 MEQ tablet Take 1 tablet (20 mEq total) by mouth daily. (Patient not taking: Reported on 06/16/2024) 30 tablet 0   No current facility-administered medications for this encounter.   Facility-Administered Medications Ordered in Other Encounters  Medication Dose Route Frequency Provider Last Rate Last Admin   0.9 %  sodium chloride  infusion   Intravenous Continuous Ennever, Peter R, MD 10 mL/hr at 05/22/24 0939 New Bag at 05/22/24 0939   0.9 %  sodium chloride  infusion   Intravenous Continuous Timmy Maude SAUNDERS, MD   Stopped at 05/22/24 1040   dextrose  5 % solution   Intravenous Continuous Ennever, Maude SAUNDERS, MD  10 mL/hr at 05/22/24 1044 New Bag at 05/22/24 1044     REVIEW OF SYSTEMS: The patient reports that they are doing well generally. Pertinent ROS per HPI and otherwise negative.    PHYSICAL EXAM:  Wt Readings from Last 3 Encounters:  06/16/24 102 lb (46.3 kg)  05/31/24 98 lb 1.9 oz (44.5 kg)  05/17/24 98 lb (44.5 kg)   Temp Readings from Last 3 Encounters:  06/16/24 97.9 F (36.6 C) (Temporal)  05/31/24 98.3 F (36.8 C) (Oral)  05/24/24 98 F (36.7 C) (Oral)   BP Readings from Last 3 Encounters:  06/16/24 106/70  05/31/24 113/80  05/24/24 (!) 143/91   Pulse Readings from Last 3 Encounters:  06/16/24 88  05/31/24 94  05/24/24 (!) 58   Pain Assessment Pain Score: 0-No pain/10   Physical Exam Vitals and nursing note reviewed.  Constitutional:      General: She is not in acute distress.    Comments: Thin female   HENT:     Head: Normocephalic and atraumatic.  Eyes:     Extraocular Movements: Extraocular movements intact.  Cardiovascular:     Rate and Rhythm: Normal rate.  Pulmonary:     Effort: Pulmonary effort is normal. No respiratory distress.  Abdominal:     General: There is no distension.  Musculoskeletal:        General: Normal range of motion.  Skin:    Coloration: Skin is not jaundiced or pale.  Neurological:     General: No focal deficit present.     Mental Status: She is alert.  Psychiatric:        Mood and Affect: Mood normal.      ECOG = 1   LABORATORY DATA:  Lab Results  Component Value Date   WBC 13.7 (H) 05/31/2024   HGB 11.6 (L) 05/31/2024   HCT 35.1 (L) 05/31/2024   MCV 88.6 05/31/2024   PLT 147 (L) 05/31/2024   Lab Results  Component Value Date   NA 139 05/31/2024   K 3.8 05/31/2024   CL 103 05/31/2024   CO2 25 05/31/2024   Lab Results  Component Value Date   ALT 43 05/31/2024   AST 31 05/31/2024   ALKPHOS 154 (H) 05/31/2024   BILITOT <0.2 05/31/2024      RADIOGRAPHY: MR PELVIS WO CM RECTAL CA STAGING Result Date: 06/06/2024 EXAM: MR RECTAL TECHNIQUE: Multiplanar multisequence MR imaging of the pelvis was performed. No intravenous contrast was administered. Ultrasound gel was administered per rectum to optimize tumor evaluation. COMPARISON: Pelvic MRI [id number] and [id number]. CLINICAL HISTORY: Rectal cancer, assess treatment response; Status post chemotherapy. Dx: Malignant neoplasm of rectosigmoid junction (HCC) C19 (ICD-10-CM). FINDINGS: TUMOR LOCATION Tumor distance from Anal Verge/Skin surface: Not explicitly stated, but involves the distal sigmoid colon and proximal rectum. Tumor distance to Internal Anal sphincter: Not explicitly stated. TUMOR DESCRIPTION Circumferential extent: Circumferential narrowing of the lumen in this vicinity as seen on image 23 of series 6. Tumor Size and volume: Tumor extends over approximately 4.8 cm segment as seen on image 23  of series 4. T - CATEGORY Extension through Muscularis Propria: There remains extension beyond the serosal surface along the dorsal/posterior margin of the proximal rectum as seen on imaging 1 of series 9. This extension measures approximately 10 mm. T3c. Shortest Distance of any tumor/node from Mesorectal fascia: 0 mm. The extreluminal extension touches the mesorectal fascia posteriorly just anterior to the sacrum with the S2-S3 level. (image 13 of series 10). Extramural Vascular  Invasion/Tumor Thrombus: No. Invasion of Anterior Peritoneal Reflection: No. Involvement of Adjacent Organs or Pelvic Sidewall: Yes. The extension touches the mesorectal fascia posteriorly just anterior to the sacrum with the S2-S3 level. T4. Levator Ani Involvement: No. N - CATEGORY Mesorectal Lymph Nodes >=87mm: There are several small lymph nodes in the mesorectum which are decreased in size compared to prior, for example, a 6 mm node posterior right of the rectum on image 8 of series 9 decreased from 9 mm. N1. Extra-mesorectal Lymphadenopathy: No. Other: IMPRESSION: 1. Rectal adenocarcinoma, T3 with posterior mesorectal fascia involvement at the S2-S3 level. 2. Rectal adenocarcinoma, N1. 3. Tumor involves distal sigmoid colon and proximal rectum. Electronically signed by: Norleen Boxer MD 06/06/2024 01:25 PM EST RP Workstation: [id number]     PATHOLOGY: no new or interval imaging     IMPRESSION/PLAN:  Ms. 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  We reviewed the standard treatment paradigm of total neoadjuvant therapy for locally advanced rectal cancer, with emphasis on neoadjuvant chemoradiation. This consists of approximately 5.5 weeks of pelvic radiation delivered with concurrent chemotherapy. The  logistics of radiation therapy were discussed in detail including CT simulation, daily weekday treatments and the anticipated overall treatment timeline.   Potential acute toxicities were reviewed including but not limited to fatigue, abdominal or pelvic pain, nausea, dysuria, urinary frequency, diarrhea, rectal bleeding and tenesmus.   The potential effects of pelvic radiation on fertility were discussed today and back in September at which time referral for fertility preservation was offered. She declines, again, stating she has completed childbearing.   Plan to start radiation 6 weeks after completion of prior therapy to allow 6 week washout of bevacizumab . Last dose of bevacizumab  05/22/24 and plan to start radiation 07/03/24.  Total time spent today in preparation for this visit was 60 minutes. This included patient care, imaging and path review, documentation, multidisciplinary discussion and coordination of care and follow up.    Estefana HERO. Maritza, M.D.   "

## 2024-06-16 NOTE — Progress Notes (Addendum)
 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    Amy Scott presented here today for a telephone follow up new appointment with Dr. Maritza. Patient had a sigmoidoscopy on 02/03/24 with Dr.Toth.     Biopsies of  (if applicable) revealed:   Past/Anticipated interventions by surgeon, if any:   Past/Anticipated interventions by medical oncology, if any:   Weight changes, if any: Her weight is still quite low.  Wt Readings from Last 4 Encounters: 06/16/2024       102.0 lb  05/03/24 99 lb 6.4 oz (45.1 kg)  04/19/24 95 lb (43.1 kg)  04/18/24 106 lb (48.1 kg)   Bowel/Bladder complaints, if any: Denies   Nausea / Vomiting, if any: Denies  Pain issues, if any:  Denies  Any blood per rectum: She reports not lately.  SAFETY ISSUES: Prior radiation? No Pacemaker/ICD? No Possible current pregnancy? No Is the patient on methotrexate? No  Current Complaints/Details: None at this time.  BP 106/70 (BP Location: Right Arm, Patient Position: Sitting)   Pulse 88   Temp 97.9 F (36.6 C) (Temporal)   Resp 17   Ht 5' 4 (1.626 m)   Wt 102 lb (46.3 kg)   SpO2 100%   BMI 17.51 kg/m

## 2024-06-16 NOTE — Addendum Note (Signed)
 Encounter addended by: Maritza Stagger, MD on: 06/16/2024 2:01 PM  Actions taken: Clinical Note Signed

## 2024-06-17 ENCOUNTER — Other Ambulatory Visit: Payer: Self-pay

## 2024-06-19 ENCOUNTER — Ambulatory Visit
Admission: RE | Admit: 2024-06-19 | Discharge: 2024-06-19 | Disposition: A | Discharge status: Home / Self Care | Payer: MEDICAID | Source: Ambulatory Visit | Attending: Radiation Oncology | Admitting: Radiation Oncology

## 2024-06-19 ENCOUNTER — Ambulatory Visit
Admission: RE | Admit: 2024-06-19 | Discharge: 2024-06-19 | Disposition: A | Payer: MEDICAID | Source: Ambulatory Visit | Attending: Radiation Oncology

## 2024-06-19 VITALS — BP 125/85 | HR 91 | Temp 98.8°F | Resp 16 | Ht 64.0 in | Wt 102.0 lb

## 2024-06-19 DIAGNOSIS — C19 Malignant neoplasm of rectosigmoid junction: Secondary | ICD-10-CM | POA: Insufficient documentation

## 2024-06-19 NOTE — Progress Notes (Signed)
 Has armband been applied?  Yes.    Does patient have an allergy to IV contrast dye?: No   Has patient ever received premedication for IV contrast dye?:  n/a  Does patient take metformin?: No  If patient does take metformin when was the last dose: n/a  Date of lab work: 05/31/2024 BUN: 7 CR: 0.44 eGfr: >60  IV site: Right Chest Port  Has IV site been added to flowsheet?  Yes.    BP 125/85 (BP Location: Right Arm)   Pulse 91   Temp 98.8 F (37.1 C) (Oral)   Resp 16   Ht 5' 4 (1.626 m)   Wt 102 lb (46.3 kg)   SpO2 100%   BMI 17.51 kg/m

## 2024-06-20 ENCOUNTER — Other Ambulatory Visit (HOSPITAL_BASED_OUTPATIENT_CLINIC_OR_DEPARTMENT_OTHER): Payer: Self-pay

## 2024-06-20 ENCOUNTER — Other Ambulatory Visit: Payer: Self-pay | Admitting: Hematology & Oncology

## 2024-06-20 MED ORDER — OXYCODONE HCL 10 MG PO TABS
20.0000 mg | ORAL_TABLET | Freq: Four times a day (QID) | ORAL | 0 refills | Status: AC | PRN
  Filled 2024-06-20: qty 120, 15d supply, fill #0

## 2024-06-20 MED ORDER — LORAZEPAM 0.5 MG PO TABS
0.5000 mg | ORAL_TABLET | Freq: Three times a day (TID) | ORAL | 0 refills | Status: AC
  Filled 2024-06-20: qty 30, 10d supply, fill #0

## 2024-06-23 ENCOUNTER — Telehealth: Payer: Self-pay

## 2024-06-23 ENCOUNTER — Inpatient Hospital Stay

## 2024-06-23 ENCOUNTER — Inpatient Hospital Stay: Admitting: Hematology & Oncology

## 2024-06-23 ENCOUNTER — Other Ambulatory Visit (HOSPITAL_COMMUNITY): Payer: Self-pay

## 2024-06-23 ENCOUNTER — Encounter: Payer: Self-pay | Admitting: *Deleted

## 2024-06-23 ENCOUNTER — Telehealth: Payer: Self-pay | Admitting: Pharmacist

## 2024-06-23 ENCOUNTER — Other Ambulatory Visit: Payer: Self-pay

## 2024-06-23 VITALS — BP 117/75 | HR 83 | Temp 98.1°F | Resp 18 | Wt 102.8 lb

## 2024-06-23 DIAGNOSIS — C19 Malignant neoplasm of rectosigmoid junction: Secondary | ICD-10-CM

## 2024-06-23 LAB — CBC WITH DIFFERENTIAL (CANCER CENTER ONLY)
Abs Immature Granulocytes: 0.01 K/uL (ref 0.00–0.07)
Basophils Absolute: 0 K/uL (ref 0.0–0.1)
Basophils Relative: 1 %
Eosinophils Absolute: 0 K/uL (ref 0.0–0.5)
Eosinophils Relative: 1 %
HCT: 41.9 % (ref 36.0–46.0)
Hemoglobin: 13.8 g/dL (ref 12.0–15.0)
Immature Granulocytes: 0 %
Lymphocytes Relative: 42 %
Lymphs Abs: 2.5 K/uL (ref 0.7–4.0)
MCH: 30.5 pg (ref 26.0–34.0)
MCHC: 32.9 g/dL (ref 30.0–36.0)
MCV: 92.5 fL (ref 80.0–100.0)
Monocytes Absolute: 0.6 K/uL (ref 0.1–1.0)
Monocytes Relative: 9 %
Neutro Abs: 2.9 K/uL (ref 1.7–7.7)
Neutrophils Relative %: 47 %
Platelet Count: 266 K/uL (ref 150–400)
RBC: 4.53 MIL/uL (ref 3.87–5.11)
RDW: 17.1 % — ABNORMAL HIGH (ref 11.5–15.5)
WBC Count: 6 K/uL (ref 4.0–10.5)
nRBC: 0 % (ref 0.0–0.2)

## 2024-06-23 LAB — CMP (CANCER CENTER ONLY)
ALT: 30 U/L (ref 0–44)
AST: 26 U/L (ref 15–41)
Albumin: 4.4 g/dL (ref 3.5–5.0)
Alkaline Phosphatase: 84 U/L (ref 38–126)
Anion gap: 7 (ref 5–15)
BUN: 6 mg/dL (ref 6–20)
CO2: 26 mmol/L (ref 22–32)
Calcium: 9.5 mg/dL (ref 8.9–10.3)
Chloride: 108 mmol/L (ref 98–111)
Creatinine: 0.57 mg/dL (ref 0.44–1.00)
GFR, Estimated: >60 mL/min
Glucose, Bld: 82 mg/dL (ref 70–99)
Potassium: 5 mmol/L (ref 3.5–5.1)
Sodium: 140 mmol/L (ref 135–145)
Total Bilirubin: 0.4 mg/dL (ref 0.0–1.2)
Total Protein: 7.1 g/dL (ref 6.5–8.1)

## 2024-06-23 LAB — PREGNANCY, URINE: Preg Test, Ur: NEGATIVE

## 2024-06-23 MED ORDER — CAPECITABINE 500 MG PO TABS
625.0000 mg/m2 | ORAL_TABLET | Freq: Two times a day (BID) | ORAL | 0 refills | Status: AC
  Filled 2024-06-23: qty 124, 30d supply, fill #0

## 2024-06-23 MED ORDER — CAPECITABINE 500 MG PO TABS: 625.0000 mg/m2 | ORAL_TABLET | Freq: Two times a day (BID) | ORAL | 0 refills | Status: DC

## 2024-06-23 NOTE — Progress Notes (Signed)
 Specialty Pharmacy Initial Fill Coordination Note  Amy Scott is a 34 y.o. female contacted today regarding refills of specialty medication(s) Capecitabine  (XELODA ) .  Patient requested Delivery  on 06/28/24  to verified address [address] [place] [id number]   Medication will be filled on 06/28/2023.   Patient is aware of $4.00 copayment.

## 2024-06-23 NOTE — Progress Notes (Signed)
 Pt. requested to have labs drawn via arm. She didn't put numbing cream on port and stated it is painful when accessed without it. Pt made aware it will need to be flushed by 07/26/24. Pt verbalized understanding.

## 2024-06-23 NOTE — Progress Notes (Signed)
 Patient will be starting concurrent ChemoRT on 07/03/2024. Her Xeloda  prescription has been sent and the specialty pharmacy is processing. Once she is completed ChemoRT the hope will be for curative surgery/   Oncology Nurse Navigator Documentation     06/23/2024    9:00 AM  Oncology Nurse Navigator Flowsheets  Phase of Treatment Chemo/Radiation Concurrent  Chemo/Radiation Concurrent Actual Start Date: 07/03/2024  Chemo/Radiation Concurrent Expected End Date: 08/09/2024  Navigator Location [facility]  Navigator Encounter Type Follow-up Appt  Patient Visit Type MedOnc  Treatment Phase Active Tx  Barriers/Navigation Needs No Barriers At This Time  Interventions Psycho-Social Support  Acuity Level 1-No Barriers  Support Groups/Services Friends and Family  Time Spent with Patient 15

## 2024-06-23 NOTE — Progress Notes (Signed)
 Oral Chemotherapy Pharmacist Encounter  I spoke with patient for overview of: Xeloda (capecitabine) for the treatment of stage III rectal cancer in conjunction with radiation, planned duration 5 1/2  weeks.   Treatment goal: Control  Counseled patient on administration, dosing, side effects, monitoring, drug-food interactions, safe handling, storage, and disposal.  Patient will take Xeloda 500mg  tablets, 2 tablets (1000mg ) by mouth in AM and 2 tabs (1000mg ) by mouth in PM, within 30 minutes of finishing meals, on days of radiation only.  Xeloda start date: 06/30/24 (per MD - he would like patient to start Xeloda 3 days prior to radiation therapy start, which is 07/03/24)  Adverse effects of Xeloda include but are not limited to: fatigue, decreased blood counts, GI upset, diarrhea, and hand-foot syndrome. Hand-foot syndrome: discussed use of cream such as [place]der[place] Smooth Extra Care 20 or equivalent advanced care cream that has 20% urea content for advanced skin hydration while on Xeloda.  Diarrhea: Patient will obtain Imodium  (loperamide ) to have on hand if they experience diarrhea. Patient knows to alert the office of 4 or more loose stools above baseline.  Reviewed with patient importance of keeping a medication schedule and plan for any missed doses. No barriers to medication adherence identified.  Medication reconciliation performed and medication/allergy list updated.  Distress thermometer flowsheet: Distress thermometer not completed during telephone call as patient has been on previous lines of therapy.   Communication and Learning Assessment Primary learner: Patient Barriers to learning: No barriers Preferred language: English Learning preferences: Listening Reading  All questions answered.  Amy Scott voiced understanding and appreciation.   Medication education handout placed in mail for patient. Patient knows to call the office with questions or concerns. Oral Chemotherapy  Clinic phone number provided to patient.   Amy Scott, PharmD, BCPS, BCOP Hematology/Oncology Clinical Pharmacist 06/23/2024 12:06 PM

## 2024-06-23 NOTE — Progress Notes (Signed)
 Oral Chemotherapy Pharmacist Encounter  Patient was counseled under telephone encounter from 06/23/24.  Asberry Macintosh, PharmD, BCPS, BCOP Hematology/Oncology Clinical Pharmacist [phone] 06/23/2024 12:12 PM

## 2024-06-23 NOTE — Progress Notes (Signed)
 " Hematology and Oncology Follow Up Visit  Amy Scott [id number] [birth year] 34 y.o. 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  She actually looks quite good.  We did go ahead and do a pelvic MRI on her.  This showed that she had a response.  There is still disease.  She had disease that was T3 in staging with some posterior mesorectal fascia involvement and had S2-S3.  She did have some small localized nodes.  We now need to get her onto radiation therapy.  She is seeing Dr. Maritza of Radiation Oncology.  She we will start radiation therapy on 07/03/2024..  I think that she needs to be on Xeloda.  She is not that big.  I think that I thought milligrams p.o. twice daily during radiation would be a good dose for her.  She is having no problems with bowels or bladder.  She is having some pain which is well-managed..  She is having no nausea or vomiting.  She is eating well.  Her weight is up 4 pounds.  She has not noted any bleeding..  There is been no leg swelling.  Overall, I would say that her performance status is probably ECOG 1.      Wt Readings from Last 3 Encounters:  06/19/24 102 lb (46.3 kg)  06/16/24 102 lb (46.3 kg)  05/31/24 98 lb 1.9 oz (44.5 kg)     Medications:  Current Outpatient Medications:    Acetaminophen  Extra Strength 500 MG CAPS, Take 2 tablets by mouth 3 (three) times daily as needed. DO NOT EXCEED 6 PILLS OR 3000 MG IN A 24 HOUR PERIOD. (Patient not taking: Reported on 06/19/2024), Disp: 60 capsule, Rfl: 1   amoxicillin -clavulanate (AUGMENTIN ) 875-125 MG tablet, Take 1 tablet by mouth every 12 (twelve) hours. (Patient not taking: Reported on 06/19/2024), Disp: 14 tablet, Rfl: 0   dexamethasone  (DECADRON ) 4 MG  tablet, Take 2 tablets (8 mg total) by mouth daily for 3 days starting the day after chemotherapy. Take with food., Disp: 30 tablet, Rfl: 1   feeding supplement (ENSURE PLUS HIGH PROTEIN) LIQD, Take 237 mLs by mouth 2 (two) times daily between meals., Disp: 5000 mL, Rfl: 0   gabapentin  (NEURONTIN ) 300 MG capsule, Take 1 capsule (300 mg total) by mouth 3 (three) times daily., Disp: 90 capsule, Rfl: 0   lidocaine -prilocaine  (EMLA ) cream, Apply 1 Application topically as needed., Disp: 30 g, Rfl: 0   loperamide  (IMODIUM ) 2 MG capsule, Take 2 tabs by mouth with first loose stool, then 1 tab with each additional loose stool as needed. Do not exceed 8 tabs in a 24-hour period, Disp: 60 capsule, Rfl: 3   LORazepam  (ATIVAN ) 0.5 MG tablet, Take 1 tablet (0.5 mg total) by mouth every 8 (eight) hours., Disp: 30 tablet, Rfl: 0   MELATONIN PO, Take 1 tablet by mouth as needed., Disp: , Rfl:    ondansetron  (ZOFRAN ) 8 MG tablet, Take 1 tablet (8 mg total) by mouth every 8 (eight) hours as needed for nausea or vomiting. Start on the third day after chemotherapy, Disp: 30 tablet, Rfl: 1   oxyCODONE  (OXYCONTIN ) 15 mg 12 hr tablet, Take 1 tablet (15 mg total) by mouth every 12 (twelve) hours., Disp: 60 tablet, Rfl: 0  oxyCODONE  ER (XTAMPZA  ER) 18 MG C12A, Take 18 mg by mouth every 12 (twelve) hours., Disp: 60 capsule, Rfl: 0   Oxycodone  HCl 10 MG TABS, Take 2 tablets (20 mg total) by mouth every 6 (six) hours as needed., Disp: 120 tablet, Rfl: 0   polyethylene glycol powder (GLYCOLAX /MIRALAX ) 17 GM/SCOOP powder, Take 17 g dissolved in liquid by mouth 2 (two) times daily., Disp: 238 g, Rfl: 0   potassium chloride  SA (KLOR-CON  M) 20 MEQ tablet, Take 1 tablet (20 mEq total) by mouth daily. (Patient not taking: Reported on 06/19/2024), Disp: 30 tablet, Rfl: 0   prochlorperazine  (COMPAZINE ) 10 MG tablet, Take 1 tablet (10 mg total) by mouth every 6 (six) hours as needed for nausea or vomiting (Nausea or vomiting)., Disp: 30  tablet, Rfl: 1   promethazine  (PHENERGAN ) 25 MG tablet, Take 1 tablet (25 mg total) by mouth every 6 (six) hours as needed for nausea or vomiting., Disp: 12 tablet, Rfl: 0 No current facility-administered medications for this visit.  Facility-Administered Medications Ordered in Other Visits:    0.9 %  sodium chloride  infusion, , Intravenous, Continuous, Hawley Michel, Amy SAUNDERS, MD, Last Rate: 10 mL/hr at 05/22/24 0939, New Bag at 05/22/24 0939   0.9 %  sodium chloride  infusion, , Intravenous, Continuous, Amy Scott, Amy SAUNDERS, MD, Stopped at 05/22/24 1040   dextrose  5 % solution, , Intravenous, Continuous, Amy Scott, Amy SAUNDERS, MD, Last Rate: 10 mL/hr at 05/22/24 1044, New Bag at 05/22/24 1044  Allergies:  Allergies  Allergen Reactions   Morphine And Codeine Hives, Shortness Of Breath, Itching and Swelling    Past Medical History, Surgical history, Social history, and Family History were reviewed and updated.  Review of Systems: Review of Systems  Constitutional: Negative.   HENT:  Negative.    Eyes: Negative.   Respiratory: Negative.    Cardiovascular: Negative.   Gastrointestinal: Negative.   Endocrine: Negative.   Genitourinary: Negative.    Musculoskeletal: Negative.   Skin: Negative.   Neurological: Negative.   Hematological: Negative.   Psychiatric/Behavioral: Negative.      Physical Exam:  Temperature was 98.1.  Pulse 83.  Blood pressure 117/75.  Weight is 102 pounds.  Wt Readings from Last 3 Encounters:  06/19/24 102 lb (46.3 kg)  06/16/24 102 lb (46.3 kg)  05/31/24 98 lb 1.9 oz (44.5 kg)    Physical Exam Vitals reviewed.  HENT:     Head: Normocephalic and atraumatic.  Eyes:     Pupils: Pupils are equal, round, and reactive to light.  Cardiovascular:     Rate and Rhythm: Normal rate and regular rhythm.     Heart sounds: Normal heart sounds.  Pulmonary:     Effort: Pulmonary effort is normal.     Breath sounds: Normal breath sounds.  Abdominal:     General: Bowel  sounds are normal.     Palpations: Abdomen is soft.  Musculoskeletal:        General: No tenderness or deformity. Normal range of motion.     Cervical back: Normal range of motion.  Lymphadenopathy:     Cervical: No cervical adenopathy.  Skin:    General: Skin is warm and dry.     Findings: No erythema or rash.  Neurological:     Mental Status: She is alert and oriented to person, place, and time.  Psychiatric:        Behavior: Behavior normal.        Thought Content: Thought content normal.  Judgment: Judgment normal.      Lab Results  Component Value Date   WBC 13.7 (H) 05/31/2024   HGB 11.6 (L) 05/31/2024   HCT 35.1 (L) 05/31/2024   MCV 88.6 05/31/2024   PLT 147 (L) 05/31/2024     Chemistry      Component Value Date/Time   NA 139 05/31/2024 0805   K 3.8 05/31/2024 0805   CL 103 05/31/2024 0805   CO2 25 05/31/2024 0805   BUN 7 05/31/2024 0805   CREATININE 0.44 05/31/2024 0805      Component Value Date/Time   CALCIUM  8.9 05/31/2024 0805   ALKPHOS 154 (H) 05/31/2024 0805   AST 31 05/31/2024 0805   ALT 43 05/31/2024 0805   BILITOT <0.2 05/31/2024 0805      Impression and Plan: Ms. Faso is a very charming 34 year old African American female.  She has locally advanced rectal cancer. She is being treated neoadjuvantly to shrink things down so that she will be able to undergo surgery.    Again, she has had her neoadjuvant chemotherapy.    We will now go ahead and get her on radiation therapy and chemotherapy.  Hopefully, we can see continued shrinkage and then she can have surgery.  I would imagine that surgery might be quite challenging.  Again she is trying hard.  She is doing everything that we have asked her to do.  I am just very impressed with her resilience.  We will go ahead and get her back to see us  in about a month.  By then, she will be in to her radiation therapy by about 2 weeks or so.  I did tell her, and I wrote it down, to start the  Xeloda 3 days before she starts the radiation therapy.  I told her just to take the Xeloda, Monday through Friday during radiation.    Amy JONELLE Crease, MD 1/23/20268:46 AM "

## 2024-06-23 NOTE — Telephone Encounter (Signed)
 Oral Oncology Patient Advocate Encounter  After completing a benefits investigation, prior authorization for Capecitabine is not required at this time through [facility].  Patient's copay is $4.00.      Charlott Hamilton,  CPhT-Adv  she/her/hers [employer]  [facility] Pharmacy Technician Patient Advocate Specialist III [facility] Phone: [phone]  Fax: [phone] [email or web address]

## 2024-06-23 NOTE — Telephone Encounter (Signed)
 Oral Oncology Pharmacist Encounter  Received new prescription for Xeloda (capecitabine) for the treatment of stage III rectal cancer in conjunction with radiation, planned for duration of radiation therapy (5.5 weeks)  CBC w/ Diff and CMP from 06/23/24 assessed, no relevant lab abnormalities requiring baseline dose adjustment required at this time. Patient's pregnancy test is negative. Prescription dose and frequency assessed for appropriateness. Will update quantity on Xeloda Rx to match radiation duration and account for patient starting Xeloda 3 days prior to radiation therapy (per MD note on 06/23/24).  Current medication list in Epic reviewed, DDIs with Xeloda identified: Category C DDI between Xeloda and Ondansetron  due to risk of Qtc prolongation with fluorouracil  products. Noted patient only taking PRN and PO route, risk higher with IV administration. No change in therapy warranted at this time.   Evaluated chart and no patient barriers to medication adherence noted.   Prescription has been e-scribed to the [facility] for benefits analysis and approval.  Oral Oncology Clinic will continue to follow for insurance authorization, copayment issues, initial counseling and start date.  Amy Scott, PharmD, BCPS, BCOP Hematology/Oncology Clinical Pharmacist [phone] 06/23/2024 9:36 AM

## 2024-06-23 NOTE — Patient Instructions (Signed)
 [facility]- A DEPT OF [facility]   Thank you for choosing [facility] to provide your oncology/hematology care and for allowing us  to participate in your care today!  As a reminder, we spoke about the following today: Xeloda (capecitabine) for the treatment of stage III rectal cancer in conjunction with radiation, planned for duration of radiation therapy (5.5 weeks)   Treatment goal: Control  Medication handout has been provided.   **For oral cancer medication prescription refill requests, contact your pharmacy and they will contact our office if needed. Allow 5-7 days for refills to be completed by your specialty pharmacy.    Cancer Center General Instructions:  If you have an appointment at the [facility], please go directly to the Cancer Center and check in at the registration area.  We strive to give you quality time with your provider. You may need to reschedule your appointment if you arrive late (15 or more minutes).  Arriving late affects you and other patients whose appointments are after yours.  Also, if you miss three or more appointments without notifying the office, you may be dismissed from the clinic at the provider's discretion.      BELOW ARE SYMPTOMS THAT SHOULD BE REPORTED IMMEDIATELY: *FEVER GREATER THAN 100.4 F (38 C) OR HIGHER *CHILLS OR SWEATING *NAUSEA AND VOMITING THAT IS NOT CONTROLLED WITH YOUR NAUSEA MEDICATION *UNUSUAL SHORTNESS OF BREATH *UNUSUAL BRUISING OR BLEEDING *URINARY PROBLEMS (pain or burning when urinating, or frequent urination) *BOWEL PROBLEMS (unusual diarrhea, constipation, pain near the anus) TENDERNESS IN MOUTH AND THROAT WITH OR WITHOUT PRESENCE OF ULCERS (sore throat, sores in mouth, or a toothache) UNUSUAL RASH, SWELLING OR PAIN  UNUSUAL VAGINAL DISCHARGE OR ITCHING   Items with * indicate a potential emergency and should be followed up as soon as possible or go to the Emergency  Department if any problems should occur.  Please show the CHEMOTHERAPY ALERT CARD at check-in to the Emergency Department and triage nurse.  Should you have questions after your visit or need to cancel or reschedule your appointment, please contact [facility] - A DEPT OF [facility]  [phone] and follow the prompts.  Office hours are 8:00 a.m. to 4:30 p.m. Monday - Friday. Please note that voicemails left after 4:00 p.m. may not be returned until the following business day.  We are closed weekends and major holidays. You have access to a nurse at all times for urgent questions. Please call the main number to the clinic [phone] and follow the prompts.  For any non-urgent questions, you may also contact your provider using MyChart. We now offer e-Visits for anyone 44 and older to request care online for non-urgent symptoms. For details visit [email or web address].   Also download the MyChart app! Go to the app store, search MyChart, open the app, select [facility], and log in with your MyChart username and password.

## 2024-06-26 ENCOUNTER — Ambulatory Visit: Payer: MEDICAID | Admitting: Radiation Oncology

## 2024-06-27 ENCOUNTER — Ambulatory Visit: Payer: MEDICAID

## 2024-06-27 ENCOUNTER — Other Ambulatory Visit: Payer: Self-pay

## 2024-06-28 ENCOUNTER — Ambulatory Visit: Payer: MEDICAID

## 2024-06-29 ENCOUNTER — Ambulatory Visit: Payer: MEDICAID

## 2024-06-30 ENCOUNTER — Ambulatory Visit: Payer: MEDICAID

## 2024-07-03 ENCOUNTER — Ambulatory Visit: Payer: MEDICAID

## 2024-07-03 ENCOUNTER — Ambulatory Visit: Payer: MEDICAID | Admitting: Radiation Oncology

## 2024-07-03 ENCOUNTER — Inpatient Hospital Stay

## 2024-07-04 ENCOUNTER — Ambulatory Visit: Payer: MEDICAID

## 2024-07-04 ENCOUNTER — Inpatient Hospital Stay

## 2024-07-04 ENCOUNTER — Other Ambulatory Visit: Payer: Self-pay

## 2024-07-04 DIAGNOSIS — C19 Malignant neoplasm of rectosigmoid junction: Secondary | ICD-10-CM

## 2024-07-04 LAB — RAD ONC ARIA SESSION SUMMARY
Course Elapsed Days: 0
Plan Fractions Treated to Date: 1
Plan Prescribed Dose Per Fraction: 1.8 Gy
Plan Total Fractions Prescribed: 25
Plan Total Prescribed Dose: 45 Gy
Reference Point Dosage Given to Date: 1.8 Gy
Reference Point Session Dosage Given: 1.8 Gy
Session Number: 1

## 2024-07-04 MED ORDER — OXYCODONE HCL ER 15 MG PO T12A: 15.0000 mg | EXTENDED_RELEASE_TABLET | Freq: Two times a day (BID) | ORAL | 0 refills | Status: DC

## 2024-07-05 ENCOUNTER — Other Ambulatory Visit: Payer: Self-pay | Admitting: *Deleted

## 2024-07-05 ENCOUNTER — Other Ambulatory Visit: Payer: Self-pay | Admitting: Radiation Oncology

## 2024-07-05 ENCOUNTER — Other Ambulatory Visit (HOSPITAL_BASED_OUTPATIENT_CLINIC_OR_DEPARTMENT_OTHER): Payer: Self-pay

## 2024-07-05 ENCOUNTER — Ambulatory Visit: Admission: RE | Admit: 2024-07-05 | Discharge: 2024-07-05 | Payer: MEDICAID | Attending: Radiation Oncology

## 2024-07-05 ENCOUNTER — Inpatient Hospital Stay

## 2024-07-05 ENCOUNTER — Other Ambulatory Visit: Payer: Self-pay

## 2024-07-05 ENCOUNTER — Ambulatory Visit
Admission: RE | Admit: 2024-07-05 | Discharge: 2024-07-05 | Disposition: A | Payer: MEDICAID | Source: Ambulatory Visit | Attending: Radiation Oncology

## 2024-07-05 ENCOUNTER — Ambulatory Visit: Payer: MEDICAID

## 2024-07-05 DIAGNOSIS — C19 Malignant neoplasm of rectosigmoid junction: Secondary | ICD-10-CM

## 2024-07-05 LAB — RAD ONC ARIA SESSION SUMMARY
Course Elapsed Days: 1
Plan Fractions Treated to Date: 2
Plan Prescribed Dose Per Fraction: 1.8 Gy
Plan Total Fractions Prescribed: 25
Plan Total Prescribed Dose: 45 Gy
Reference Point Dosage Given to Date: 3.6 Gy
Reference Point Session Dosage Given: 1.8 Gy
Session Number: 2

## 2024-07-05 MED ORDER — OXYCODONE HCL ER 15 MG PO T12A
15.0000 mg | EXTENDED_RELEASE_TABLET | Freq: Two times a day (BID) | ORAL | 0 refills | Status: AC
  Filled 2024-07-05: qty 60, 30d supply, fill #0

## 2024-07-05 MED ORDER — OLANZAPINE 10 MG PO TABS
10.0000 mg | ORAL_TABLET | Freq: Every day | ORAL | 1 refills | Status: AC
  Filled 2024-07-05: qty 30, 30d supply, fill #0

## 2024-07-05 MED ORDER — TRAZODONE HCL 50 MG PO TABS: 50.0000 mg | ORAL_TABLET | Freq: Every day | ORAL | 2 refills | Status: AC

## 2024-07-06 ENCOUNTER — Other Ambulatory Visit: Payer: Self-pay

## 2024-07-06 ENCOUNTER — Ambulatory Visit: Payer: MEDICAID

## 2024-07-06 ENCOUNTER — Ambulatory Visit
Admission: RE | Admit: 2024-07-06 | Discharge: 2024-07-06 | Disposition: A | Payer: MEDICAID | Source: Ambulatory Visit | Attending: Radiation Oncology

## 2024-07-06 ENCOUNTER — Inpatient Hospital Stay

## 2024-07-06 LAB — RAD ONC ARIA SESSION SUMMARY
Course Elapsed Days: 2
Plan Fractions Treated to Date: 3
Plan Prescribed Dose Per Fraction: 1.8 Gy
Plan Total Fractions Prescribed: 25
Plan Total Prescribed Dose: 45 Gy
Reference Point Dosage Given to Date: 5.4 Gy
Reference Point Session Dosage Given: 1.8 Gy
Session Number: 3

## 2024-07-07 ENCOUNTER — Inpatient Hospital Stay

## 2024-07-07 ENCOUNTER — Ambulatory Visit: Payer: MEDICAID

## 2024-07-07 ENCOUNTER — Ambulatory Visit: Admission: RE | Admit: 2024-07-07 | Payer: MEDICAID | Source: Ambulatory Visit

## 2024-07-07 ENCOUNTER — Other Ambulatory Visit: Payer: Self-pay

## 2024-07-07 LAB — RAD ONC ARIA SESSION SUMMARY
Course Elapsed Days: 3
Plan Fractions Treated to Date: 4
Plan Prescribed Dose Per Fraction: 1.8 Gy
Plan Total Fractions Prescribed: 25
Plan Total Prescribed Dose: 45 Gy
Reference Point Dosage Given to Date: 7.2 Gy
Reference Point Session Dosage Given: 1.8 Gy
Session Number: 4

## 2024-07-10 ENCOUNTER — Ambulatory Visit: Payer: MEDICAID

## 2024-07-11 ENCOUNTER — Ambulatory Visit: Payer: MEDICAID

## 2024-07-12 ENCOUNTER — Ambulatory Visit: Payer: MEDICAID

## 2024-07-13 ENCOUNTER — Ambulatory Visit: Payer: MEDICAID

## 2024-07-14 ENCOUNTER — Ambulatory Visit: Payer: MEDICAID

## 2024-07-17 ENCOUNTER — Ambulatory Visit: Payer: MEDICAID

## 2024-07-18 ENCOUNTER — Ambulatory Visit: Payer: MEDICAID

## 2024-07-19 ENCOUNTER — Ambulatory Visit: Payer: MEDICAID

## 2024-07-20 ENCOUNTER — Ambulatory Visit: Payer: MEDICAID

## 2024-07-21 ENCOUNTER — Inpatient Hospital Stay: Admitting: Hematology & Oncology

## 2024-07-21 ENCOUNTER — Inpatient Hospital Stay

## 2024-07-21 ENCOUNTER — Ambulatory Visit: Payer: MEDICAID

## 2024-07-24 ENCOUNTER — Ambulatory Visit: Payer: MEDICAID

## 2024-07-25 ENCOUNTER — Ambulatory Visit: Payer: MEDICAID

## 2024-07-26 ENCOUNTER — Ambulatory Visit: Payer: MEDICAID

## 2024-07-27 ENCOUNTER — Ambulatory Visit: Payer: MEDICAID

## 2024-07-28 ENCOUNTER — Ambulatory Visit: Payer: MEDICAID

## 2024-07-31 ENCOUNTER — Ambulatory Visit: Payer: MEDICAID

## 2024-08-01 ENCOUNTER — Ambulatory Visit: Payer: MEDICAID

## 2024-08-02 ENCOUNTER — Ambulatory Visit: Payer: MEDICAID

## 2024-08-03 ENCOUNTER — Ambulatory Visit: Payer: MEDICAID

## 2024-08-04 ENCOUNTER — Ambulatory Visit: Payer: MEDICAID

## 2024-08-07 ENCOUNTER — Ambulatory Visit: Payer: MEDICAID

## 2024-08-08 ENCOUNTER — Ambulatory Visit: Payer: MEDICAID

## 2024-08-09 ENCOUNTER — Ambulatory Visit: Payer: MEDICAID

## 2024-08-10 ENCOUNTER — Ambulatory Visit: Payer: MEDICAID

## 2024-08-11 ENCOUNTER — Ambulatory Visit: Payer: MEDICAID
